# Patient Record
Sex: Female | Born: 1955 | Race: Black or African American | Hispanic: No | Smoking: Never smoker
Health system: Southern US, Community
[De-identification: ages and names within clinical notes are randomized; demographics above are authoritative.]

## PROBLEM LIST (undated history)

## (undated) DIAGNOSIS — E119 Type 2 diabetes mellitus without complications: Secondary | ICD-10-CM

## (undated) DIAGNOSIS — I10 Essential (primary) hypertension: Secondary | ICD-10-CM

## (undated) DIAGNOSIS — K219 Gastro-esophageal reflux disease without esophagitis: Secondary | ICD-10-CM

## (undated) DIAGNOSIS — E7800 Pure hypercholesterolemia, unspecified: Secondary | ICD-10-CM

## (undated) DIAGNOSIS — IMO0002 Reserved for concepts with insufficient information to code with codable children: Secondary | ICD-10-CM

## (undated) HISTORY — PX: KNEE SURGERY: SHX244

## (undated) HISTORY — DX: Gastro-esophageal reflux disease without esophagitis: K21.9

## (undated) HISTORY — PX: TUBAL LIGATION: SHX77

## (undated) HISTORY — DX: Pure hypercholesterolemia, unspecified: E78.00

## (undated) HISTORY — DX: Type 2 diabetes mellitus without complications: E11.9

## (undated) HISTORY — DX: Essential (primary) hypertension: I10

## (undated) HISTORY — PX: BREAST REDUCTION SURGERY: SHX8

---

## 1998-01-17 ENCOUNTER — Ambulatory Visit (HOSPITAL_COMMUNITY): Admission: RE | Admit: 1998-01-17 | Discharge: 1998-01-17 | Payer: Self-pay | Admitting: Obstetrics & Gynecology

## 1999-02-03 ENCOUNTER — Encounter: Payer: Self-pay | Admitting: Obstetrics & Gynecology

## 1999-02-03 ENCOUNTER — Ambulatory Visit (HOSPITAL_COMMUNITY): Admission: RE | Admit: 1999-02-03 | Discharge: 1999-02-03 | Payer: Self-pay | Admitting: Obstetrics & Gynecology

## 2000-02-14 ENCOUNTER — Encounter: Payer: Self-pay | Admitting: Obstetrics & Gynecology

## 2000-02-14 ENCOUNTER — Ambulatory Visit (HOSPITAL_COMMUNITY): Admission: RE | Admit: 2000-02-14 | Discharge: 2000-02-14 | Payer: Self-pay | Admitting: Obstetrics & Gynecology

## 2000-09-09 ENCOUNTER — Encounter (INDEPENDENT_AMBULATORY_CARE_PROVIDER_SITE_OTHER): Payer: Self-pay

## 2000-09-09 ENCOUNTER — Other Ambulatory Visit: Admission: RE | Admit: 2000-09-09 | Discharge: 2000-09-09 | Payer: Self-pay | Admitting: *Deleted

## 2000-11-01 ENCOUNTER — Other Ambulatory Visit: Admission: RE | Admit: 2000-11-01 | Discharge: 2000-11-01 | Payer: Self-pay | Admitting: Obstetrics & Gynecology

## 2001-11-06 ENCOUNTER — Other Ambulatory Visit: Admission: RE | Admit: 2001-11-06 | Discharge: 2001-11-06 | Payer: Self-pay | Admitting: Obstetrics & Gynecology

## 2001-11-07 ENCOUNTER — Encounter: Payer: Self-pay | Admitting: Obstetrics & Gynecology

## 2001-11-07 ENCOUNTER — Ambulatory Visit (HOSPITAL_COMMUNITY): Admission: RE | Admit: 2001-11-07 | Discharge: 2001-11-07 | Payer: Self-pay | Admitting: Obstetrics & Gynecology

## 2002-10-19 ENCOUNTER — Ambulatory Visit (HOSPITAL_COMMUNITY): Admission: RE | Admit: 2002-10-19 | Discharge: 2002-10-19 | Payer: Self-pay | Admitting: Family Medicine

## 2002-10-19 ENCOUNTER — Encounter: Payer: Self-pay | Admitting: Family Medicine

## 2003-01-06 ENCOUNTER — Other Ambulatory Visit: Admission: RE | Admit: 2003-01-06 | Discharge: 2003-01-06 | Payer: Self-pay | Admitting: Obstetrics & Gynecology

## 2003-08-17 ENCOUNTER — Encounter: Admission: RE | Admit: 2003-08-17 | Discharge: 2003-08-17 | Payer: Self-pay | Admitting: Neurosurgery

## 2003-09-03 ENCOUNTER — Encounter: Admission: RE | Admit: 2003-09-03 | Discharge: 2003-09-03 | Payer: Self-pay | Admitting: Neurosurgery

## 2004-05-06 ENCOUNTER — Ambulatory Visit (HOSPITAL_COMMUNITY): Admission: RE | Admit: 2004-05-06 | Discharge: 2004-05-06 | Payer: Self-pay | Admitting: Orthopedic Surgery

## 2005-01-09 ENCOUNTER — Encounter: Admission: RE | Admit: 2005-01-09 | Discharge: 2005-01-09 | Payer: Self-pay | Admitting: Neurosurgery

## 2005-06-13 ENCOUNTER — Other Ambulatory Visit: Admission: RE | Admit: 2005-06-13 | Discharge: 2005-06-13 | Payer: Self-pay | Admitting: Obstetrics & Gynecology

## 2005-06-20 ENCOUNTER — Ambulatory Visit (HOSPITAL_COMMUNITY): Admission: RE | Admit: 2005-06-20 | Discharge: 2005-06-20 | Payer: Self-pay | Admitting: Obstetrics & Gynecology

## 2005-07-17 ENCOUNTER — Emergency Department (HOSPITAL_COMMUNITY): Admission: EM | Admit: 2005-07-17 | Discharge: 2005-07-17 | Payer: Self-pay | Admitting: Emergency Medicine

## 2007-02-06 ENCOUNTER — Encounter: Admission: RE | Admit: 2007-02-06 | Discharge: 2007-02-06 | Payer: Self-pay | Admitting: Neurology

## 2007-02-26 ENCOUNTER — Encounter: Admission: RE | Admit: 2007-02-26 | Discharge: 2007-02-26 | Payer: Self-pay | Admitting: Neurology

## 2011-11-14 ENCOUNTER — Encounter: Payer: Self-pay | Admitting: Internal Medicine

## 2011-12-18 ENCOUNTER — Other Ambulatory Visit: Payer: Self-pay | Admitting: Internal Medicine

## 2012-04-24 ENCOUNTER — Emergency Department (HOSPITAL_COMMUNITY)
Admission: EM | Admit: 2012-04-24 | Discharge: 2012-04-24 | Disposition: A | Discharge status: Home / Self Care | Payer: Managed Care, Other (non HMO) | Attending: Emergency Medicine | Admitting: Emergency Medicine

## 2012-04-24 ENCOUNTER — Encounter (HOSPITAL_COMMUNITY): Payer: Self-pay | Admitting: *Deleted

## 2012-04-24 DIAGNOSIS — J069 Acute upper respiratory infection, unspecified: Secondary | ICD-10-CM

## 2012-04-24 DIAGNOSIS — J029 Acute pharyngitis, unspecified: Secondary | ICD-10-CM | POA: Insufficient documentation

## 2012-04-24 DIAGNOSIS — IMO0002 Reserved for concepts with insufficient information to code with codable children: Secondary | ICD-10-CM | POA: Insufficient documentation

## 2012-04-24 HISTORY — DX: Reserved for concepts with insufficient information to code with codable children: IMO0002

## 2012-04-24 LAB — RAPID STREP SCREEN (MED CTR MEBANE ONLY): Streptococcus, Group A Screen (Direct): NEGATIVE

## 2012-04-24 NOTE — ED Provider Notes (Addendum)
History     CSN: [id number]  Arrival date & time 04/24/12  1105   First MD Initiated Contact with Patient 04/24/12 1144      Chief Complaint  Patient presents with  . Numbness  . Sore Throat    (Consider location/radiation/quality/duration/timing/severity/associated sxs/prior treatment) HPI  A generally healthy 56 year old female is complaining of sore throat and numbness to the face. She reports for the past month she has had intermittent bouts of facial numbness. First recurrent was a month ago when she noticed sore throat and tenderness to the left tonsil. At that time she also experiencing a tingling sensation to the left side of her face. Patient also noticed white spots on the tonsils and was using a Q-tip to remove it. States symptoms lasted for a day and resolved. For the past 2-3 days she has noticed the same symptoms again. She experiencing sore throat with tenderness to her left tonsil. She also experiencing mild ear discomfort and a mild throbbing headache. Has notice tingling sensation to the left side of her face, also extending down to her left shoulder around left nipple. She mentioned that she has breast reduction a while back and has had intermittent numbness and  tingling sensation to the affected site, this sensation is similar.  she denies any chest pain, shortness of breath, nausea, diaphoresis or rash.  No hx of heart disease, nonsmoker.    Denies fever, chills, vision changes, neck pain, back pain, or weakness. Does not have a PCP but does have a scheduled appointment with Dr. Velna Hatchet to establish PCP care.      Past Medical History  Diagnosis Date  . DDD (degenerative disc disease)     Past Surgical History  Procedure Date  . Knee surgery   . Tubal ligation   . Cesarean section   . Breast reduction surgery     No family history on file.  History  Substance Use Topics  . Smoking status: Never Smoker   . Smokeless tobacco: Not on file  . Alcohol  Use: No    OB History    Grav Para Term Preterm Abortions TAB SAB Ect Mult Living                  Review of Systems  All other systems reviewed and are negative.    Allergies  Skelaxin  Home Medications   Current Outpatient Rx  Name Route Sig Dispense Refill  . CALTRATE 600 + SOY PO Oral Take 1 tablet by mouth daily.    Marland Kitchen VITAMIN D 1000 UNITS PO TABS Oral Take 2,000 Units by mouth daily.    . CYCLOBENZAPRINE HCL 5 MG PO TABS Oral Take 5 mg by mouth 3 (three) times daily as needed. Muscle spasms    . GLUCOSAMINE-CHONDROITIN 500-400 MG PO TABS Oral Take 2 tablets by mouth daily.    . IBUPROFEN 200 MG PO TABS Oral Take 800 mg by mouth every 6 (six) hours as needed. pain    . LACTASE 3000 UNITS PO TABS Oral Take 1 tablet by mouth 3 (three) times daily as needed. Dairy consumption    . LIDOCAINE 5 % EX PTCH Transdermal Place 1 patch onto the skin daily as needed. Pain.Marland KitchenMarland KitchenMarland KitchenRemove & Discard patch within 12 hours or as directed by MD    . TRAMADOL HCL 50 MG PO TABS Oral Take 50 mg by mouth every 6 (six) hours as needed. pain      There were no vitals taken  for this visit.  Physical Exam  Nursing note and vitals reviewed. Constitutional: She is oriented to person, place, and time. She appears well-developed and well-nourished. No distress.  HENT:  Head: Normocephalic and atraumatic.  Right Ear: External ear normal.  Left Ear: External ear normal.  Nose: Nose normal.  Mouth/Throat: Uvula is midline and oropharynx is clear and moist.       Uvula Midline. Mild tonsillar enlargement bilaterally with minimal exudates. No evidence of peritonsillar abscess, or Ludwig's angina  Eyes: Conjunctivae and EOM are normal. Pupils are equal, round, and reactive to light.  Neck: Normal range of motion. Neck supple.  Lymphadenopathy:    She has no cervical adenopathy.  Neurological: She is alert and oriented to person, place, and time. She has normal strength. No cranial nerve deficit or sensory  deficit. GCS eye subscore is 4. GCS verbal subscore is 5. GCS motor subscore is 6.    ED Course  Procedures (including critical care time)  Labs Reviewed - No data to display No results found.   No diagnosis found.   Date: 05/22/2012  Rate: 71  Rhythm: normal sinus rhythm  QRS Axis: normal  Intervals: normal  ST/T Wave abnormalities: normal  Conduction Disutrbances: none  Narrative Interpretation:   Old EKG Reviewed: No significant changes noted     Results for orders placed during the hospital encounter of 04/24/12  RAPID STREP SCREEN      Component Value Range   Streptococcus, Group A Screen (Direct) NEGATIVE  NEGATIVE   1. Viral pharyngitis   MDM  Vague facial numbness with no obvious numbness on exam. CN II-XII grossly intact.  No facial asymmetry.  5/5 strength to upper extremity.  No evidence of stroke.  Low suspicion for cardiopulmonary etiology.  On exam pt does have tonsilar enlargement with mild exudates.  Strep test ordered.    12:45 PM Strep test neg.  I discussed with my attending, who has evaluated pt and agrees pt is safe to be discharged.  Will treat for URI with symptomatic treatment.  Pt agrees with plan.        Fayrene Helper, PA-C 04/24/12 1250  Fayrene Helper, PA-C 05/22/12 1746

## 2012-04-24 NOTE — ED Notes (Signed)
Pt reports numbness to L side of face for a few days. Sore throat, ear pain, slight HA, mild swelling to L jawline. Sts numbness extends to L shoulder and around L nipple. Neg stroke screen. Pt A&Ox4.

## 2012-04-24 NOTE — ED Provider Notes (Signed)
56 y.o. Female with sore throat and some numbness in back of throat.  PE Some tonsillar exudate.  History/physical exam/procedure(s) were performed by non-physician practitioner and as supervising physician I was immediately available for consultation/collaboration. I have reviewed all notes and am in agreement with care and plan.   Hilario Quarry, MD 04/24/12 [phone]

## 2012-04-24 NOTE — ED Notes (Signed)
PA at bedside Pt alert and oriented x4. Respirations even and unlabored, bilateral symmetrical rise and fall of chest. Skin warm and dry. In no acute distress. Denies needs.   

## 2012-05-23 NOTE — ED Provider Notes (Signed)
56 y.o. female with complaints of uri symptoms and facial paresthesia with normal exam.  History/physical exam/procedure(s) were performed by non-physician practitioner and as supervising physician I was immediately available for consultation/collaboration. I have reviewed all notes and am in agreement with care and plan.   Hilario Quarry, MD 05/23/12 [phone]

## 2014-09-06 ENCOUNTER — Other Ambulatory Visit: Payer: Self-pay | Admitting: Obstetrics & Gynecology

## 2014-09-06 DIAGNOSIS — R928 Other abnormal and inconclusive findings on diagnostic imaging of breast: Secondary | ICD-10-CM

## 2014-09-22 ENCOUNTER — Ambulatory Visit
Admission: RE | Admit: 2014-09-22 | Discharge: 2014-09-22 | Disposition: A | Discharge status: Home / Self Care | Payer: Managed Care, Other (non HMO) | Source: Ambulatory Visit | Attending: Obstetrics & Gynecology | Admitting: Obstetrics & Gynecology

## 2014-09-22 DIAGNOSIS — R928 Other abnormal and inconclusive findings on diagnostic imaging of breast: Secondary | ICD-10-CM

## 2016-04-25 LAB — HM COLONOSCOPY

## 2016-10-11 IMAGING — MG MM DIANOSTIC UNILATERAL R
2 series · 2 of 2 positions shown · non-contrast
Comparison: Previous examinations.

CLINICAL DATA: Recall from screening mammogram.

EXAM:
DIGITAL DIAGNOSTIC  right breast MAMMOGRAM WITH CAD

[R MLO]
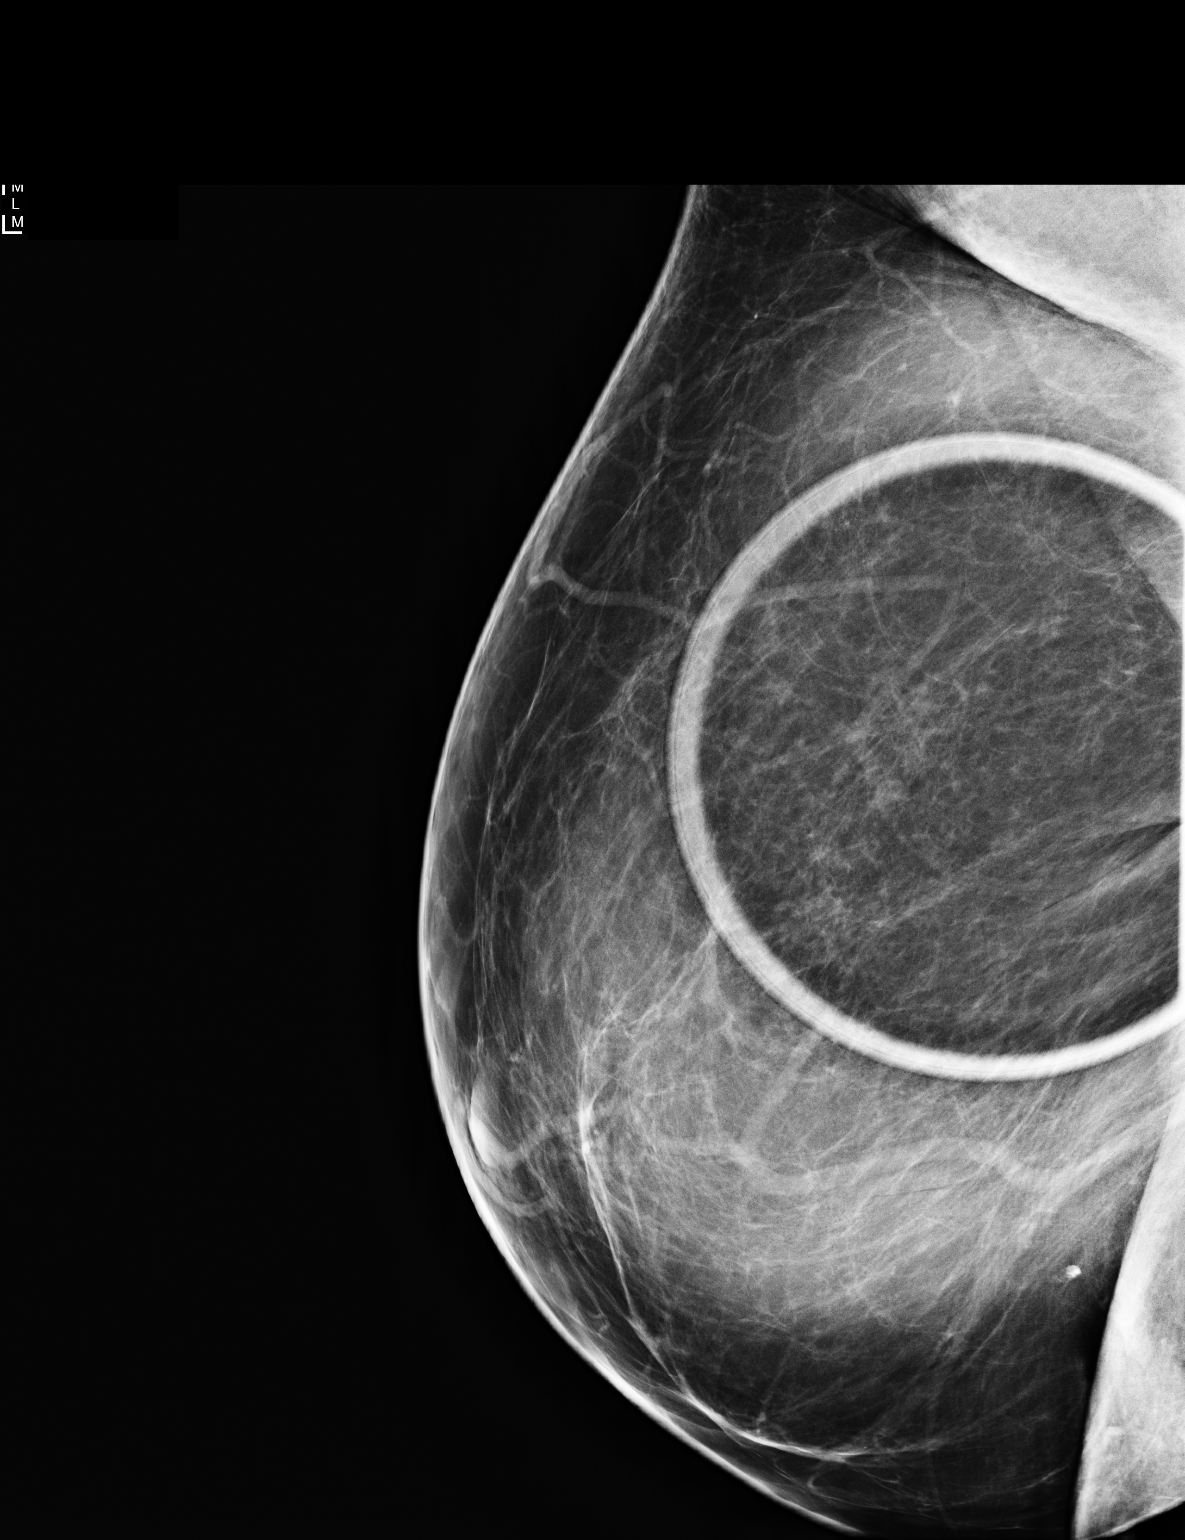

[R CC]
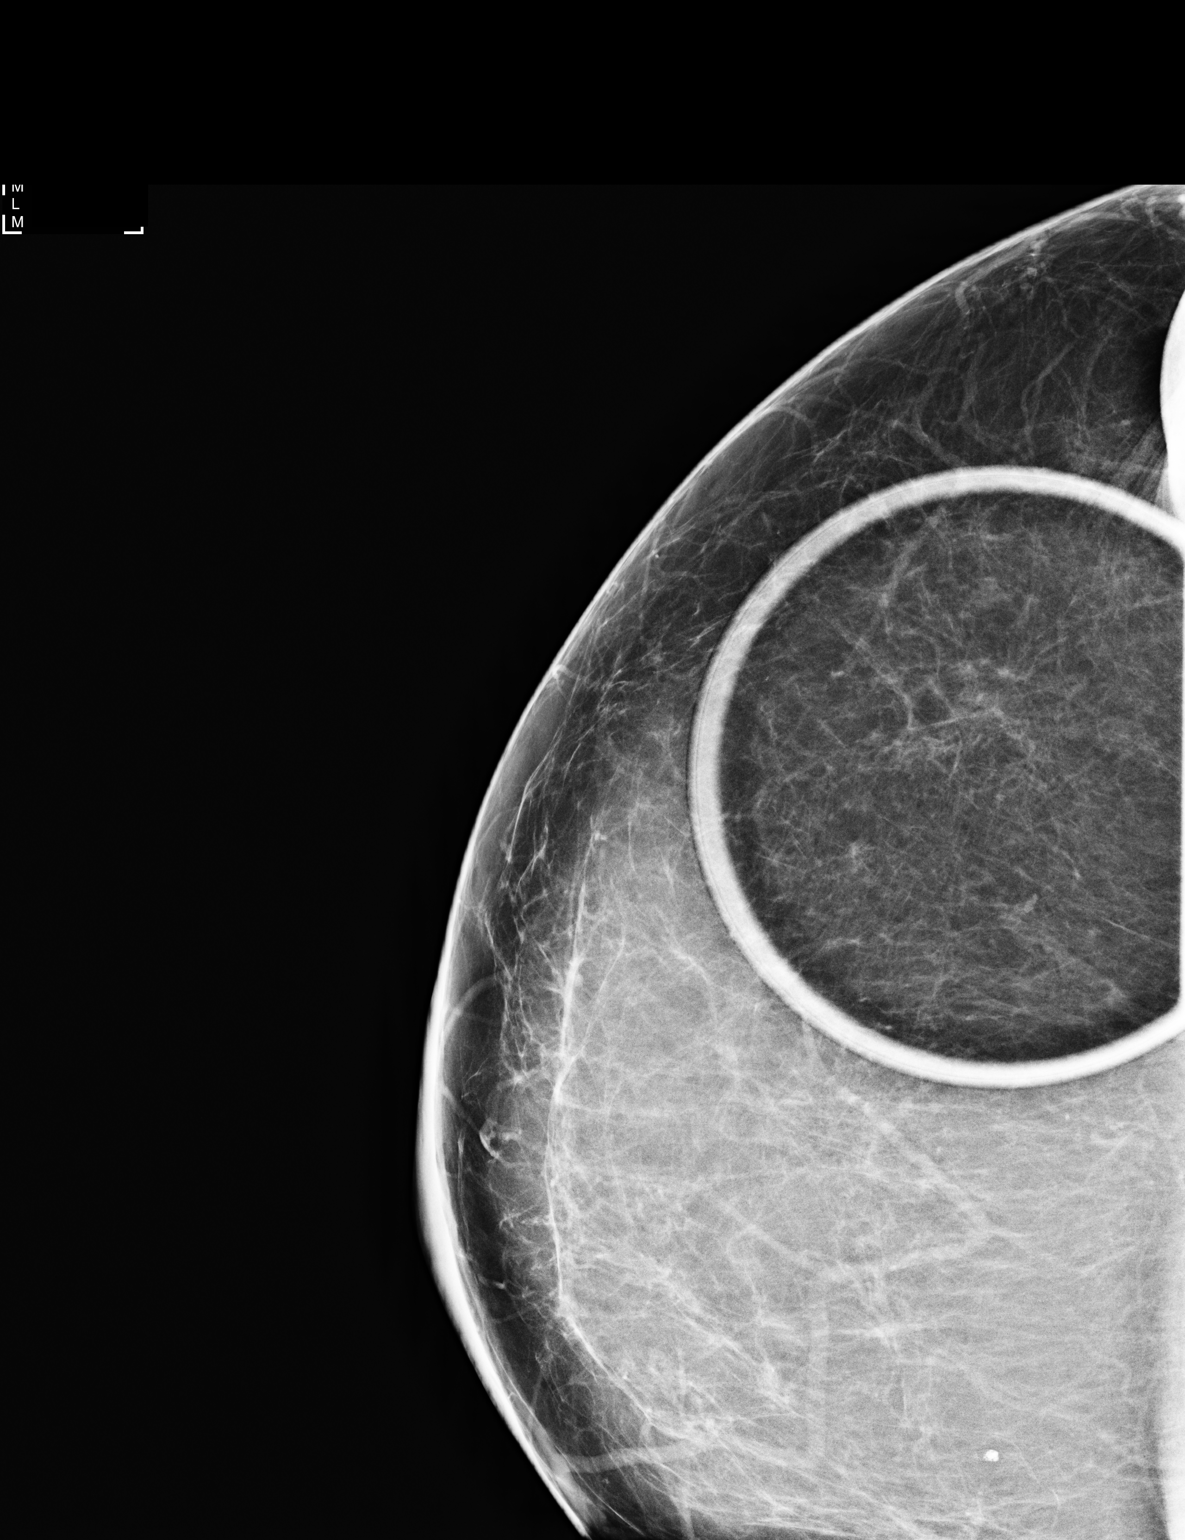

[2 of 2 positions shown; findings below may reference images not displayed]

ACR Breast Density Category b: There are scattered areas of
fibroglandular density.
FINDINGS: Additional views of the right breast demonstrate a small benign
appearing intramammary lymph node located within the upper outer
quadrant of the right breast which appears stable compared with
prior studies.

Mammographic images were processed with CAD.
IMPRESSION: No findings worrisome for malignancy. Recommend screening
mammography in 1 year.

RECOMMENDATION:
Bilateral screening mammography in 1 year.

I have discussed the findings and recommendations with the patient.
Results were also provided in writing at the conclusion of the
visit. If applicable, a reminder letter will be sent to the patient
regarding the next appointment.

BI-RADS CATEGORY  1: Negative.

## 2018-08-05 ENCOUNTER — Other Ambulatory Visit: Payer: Self-pay | Admitting: Internal Medicine

## 2018-08-06 NOTE — Telephone Encounter (Signed)
Tramadol refill

## 2018-08-07 ENCOUNTER — Ambulatory Visit: Payer: Self-pay | Admitting: Internal Medicine

## 2018-08-22 ENCOUNTER — Ambulatory Visit: Payer: Self-pay | Admitting: Nurse Practitioner

## 2018-09-15 ENCOUNTER — Encounter (HOSPITAL_COMMUNITY): Payer: Self-pay

## 2018-09-15 ENCOUNTER — Ambulatory Visit (HOSPITAL_COMMUNITY)
Admission: EM | Admit: 2018-09-15 | Discharge: 2018-09-15 | Disposition: A | Discharge status: Home / Self Care | Payer: BC Managed Care – PPO | Attending: Internal Medicine | Admitting: Internal Medicine

## 2018-09-15 DIAGNOSIS — B9789 Other viral agents as the cause of diseases classified elsewhere: Secondary | ICD-10-CM

## 2018-09-15 DIAGNOSIS — R05 Cough: Secondary | ICD-10-CM | POA: Diagnosis present

## 2018-09-15 DIAGNOSIS — J069 Acute upper respiratory infection, unspecified: Secondary | ICD-10-CM

## 2018-09-15 DIAGNOSIS — J028 Acute pharyngitis due to other specified organisms: Secondary | ICD-10-CM | POA: Diagnosis not present

## 2018-09-15 DIAGNOSIS — Z79899 Other long term (current) drug therapy: Secondary | ICD-10-CM | POA: Diagnosis not present

## 2018-09-15 DIAGNOSIS — J029 Acute pharyngitis, unspecified: Secondary | ICD-10-CM

## 2018-09-15 DIAGNOSIS — Z888 Allergy status to other drugs, medicaments and biological substances status: Secondary | ICD-10-CM | POA: Insufficient documentation

## 2018-09-15 LAB — POCT RAPID STREP A: Streptococcus, Group A Screen (Direct): NEGATIVE

## 2018-09-15 MED ORDER — PROMETHAZINE-DM 6.25-15 MG/5ML PO SYRP: 5.0000 mL | ORAL_SOLUTION | Freq: Every evening | ORAL | 0 refills | Status: DC | PRN

## 2018-09-15 MED ORDER — BENZONATATE 100 MG PO CAPS: 100.0000 mg | ORAL_CAPSULE | Freq: Three times a day (TID) | ORAL | 0 refills | Status: DC | PRN

## 2018-09-15 NOTE — Discharge Instructions (Signed)
Hydrate well with at least 2 liters of water daily. You may pick up over-the-counter pseudoephedrine (Sudafed) and use this for post-nasal drainage, sinus congestion at a dose of 60mg  every 8 hours as needed. For sore throat try using a honey-based tea. Use 3 teaspoons of honey with juice squeezed from half lemon. Place shaved pieces of ginger into 1/2-1 cup of water and warm over stove top. Then mix the ingredients and repeat every 4 hours as needed.

## 2018-09-15 NOTE — ED Provider Notes (Signed)
MRN: [id number] DOB: 12/26/1955  Subjective:   Jasmine Rodriguez is a 62 y.o. female presenting for 4 day history of moderate-severe productive cough worse in the morning that elicits occasional wheezing, throat pain, intermittent headaches, hoarseness, sinus pressure/congestion. Has tried APAP, Tussin DM. Denies smoking cigarettes. Has an albuterol inhaler, states that she thinks she has adult onset asthma. Has used her inhaler 3-4 times in the past week.    No current facility-administered medications for this encounter.   Current Outpatient Medications:  .  Calcium Carb-Vit D-Soy Isoflav (CALTRATE 600 + SOY PO), Take 1 tablet by mouth daily., Disp: , Rfl:  .  cholecalciferol (VITAMIN D) 1000 UNITS tablet, Take 2,000 Units by mouth daily., Disp: , Rfl:  .  cyclobenzaprine (FLEXERIL) 5 MG tablet, Take 5 mg by mouth 3 (three) times daily as needed. Muscle spasms, Disp: , Rfl:  .  glucosamine-chondroitin 500-400 MG tablet, Take 2 tablets by mouth daily., Disp: , Rfl:  .  ibuprofen (ADVIL,MOTRIN) 200 MG tablet, Take 800 mg by mouth every 6 (six) hours as needed. pain, Disp: , Rfl:  .  lactase (LACTAID) 3000 UNITS tablet, Take 1 tablet by mouth 3 (three) times daily as needed. Dairy consumption, Disp: , Rfl:  .  lidocaine (LIDODERM) 5 %, Place 1 patch onto the skin daily as needed. Pain.Marland Kitchen.Marland Kitchen.Marland Kitchen.Remove & Discard patch within 12 hours or as directed by MD, Disp: , Rfl:  .  traMADol (ULTRAM) 50 MG tablet, TAKE 1 TABLET BY MOUTH EVERY 8 HOURS AS NEEDED, Disp: 40 tablet, Rfl: 1    Allergies  Allergen Reactions  . Skelaxin [Metaxalone] Anaphylaxis, Itching and Rash    Past Medical History:  Diagnosis Date  . DDD (degenerative disc disease)      Past Surgical History:  Procedure Laterality Date  . BREAST REDUCTION SURGERY    . CESAREAN SECTION    . KNEE SURGERY    . TUBAL LIGATION      Objective:   Vitals: BP (!) 144/82 (BP Location: Left Arm)   Pulse 89   Temp 98.1 F (36.7 C) (Oral)    Resp 16   SpO2 100%    Physical Exam Constitutional:      General: She is not in acute distress.    Appearance: She is well-developed. She is not ill-appearing, toxic-appearing or diaphoretic.  HENT:     Right Ear: Tympanic membrane normal. No drainage, swelling or tenderness. No middle ear effusion. Tympanic membrane is not erythematous.     Left Ear: Tympanic membrane normal. No drainage, swelling or tenderness.  No middle ear effusion. Tympanic membrane is not erythematous.     Nose: Congestion and rhinorrhea present.     Mouth/Throat:     Mouth: Mucous membranes are moist. No oral lesions.     Pharynx: Oropharynx is clear. No pharyngeal swelling, oropharyngeal exudate, posterior oropharyngeal erythema or uvula swelling.     Tonsils: No tonsillar exudate or tonsillar abscesses.  Eyes:     Conjunctiva/sclera: Conjunctivae normal.     Pupils: Pupils are equal, round, and reactive to light.  Cardiovascular:     Rate and Rhythm: Normal rate and regular rhythm.     Heart sounds: No murmur. No friction rub. No gallop.   Pulmonary:     Effort: No respiratory distress.     Breath sounds: No stridor. No wheezing, rhonchi or rales.  Neurological:     Mental Status: She is alert and oriented to person, place, and time.    Results  for orders placed or performed during the hospital encounter of 09/15/18 (from the past 24 hour(s))  POCT rapid strep A [facility])     Status: None   Collection Time: 09/15/18  1:42 PM  Result Value Ref Range   Streptococcus, Group A Screen (Direct) NEGATIVE NEGATIVE   Assessment and Plan :   Viral URI with cough  Sore throat  Likely viral in etiology d/t reassuring physical exam findings. Advised supportive care, offered symptomatic relief.  Strep culture is pending.  Offered patient prescription for prednisone which she declined for now.  Return-to-clinic precautions discussed, patient verbalized understanding.      Wallis Bamberg, PA-C 09/15/18  1540

## 2018-09-15 NOTE — ED Triage Notes (Signed)
Pt present coughing with throat pain, symptoms started 4 days ago.

## 2018-09-18 LAB — CULTURE, GROUP A STREP (THRC)

## 2018-09-19 ENCOUNTER — Ambulatory Visit: Payer: Self-pay | Admitting: Nurse Practitioner

## 2018-10-16 ENCOUNTER — Ambulatory Visit: Payer: BC Managed Care – PPO | Admitting: Nurse Practitioner

## 2018-10-16 ENCOUNTER — Encounter: Payer: Self-pay | Admitting: Nurse Practitioner

## 2018-10-16 VITALS — BP 132/82 | HR 70 | Temp 98.1°F | Ht 67.0 in | Wt 224.2 lb

## 2018-10-16 DIAGNOSIS — M5442 Lumbago with sciatica, left side: Secondary | ICD-10-CM | POA: Diagnosis not present

## 2018-10-16 DIAGNOSIS — R7303 Prediabetes: Secondary | ICD-10-CM | POA: Insufficient documentation

## 2018-10-16 DIAGNOSIS — J069 Acute upper respiratory infection, unspecified: Secondary | ICD-10-CM | POA: Diagnosis not present

## 2018-10-16 DIAGNOSIS — G8929 Other chronic pain: Secondary | ICD-10-CM | POA: Diagnosis not present

## 2018-10-16 MED ORDER — TRAMADOL HCL 50 MG PO TABS: 50.0000 mg | ORAL_TABLET | Freq: Three times a day (TID) | ORAL | 1 refills | Status: DC | PRN

## 2018-10-16 MED ORDER — LIDOCAINE 5 % EX PTCH: 1.0000 | MEDICATED_PATCH | Freq: Every day | CUTANEOUS | 2 refills | Status: DC | PRN

## 2018-10-16 MED ORDER — AZITHROMYCIN 250 MG PO TABS: ORAL_TABLET | ORAL | 0 refills | Status: AC

## 2018-10-16 NOTE — Patient Instructions (Signed)

## 2018-10-16 NOTE — Progress Notes (Addendum)
Subjective:     Patient ID: Jasmine Rodriguez , female    DOB: 15-Sep-1956 , 63 y.o.   MRN: [id number]   Chief Complaint  Patient presents with  . Diabetes    HPI  [facility] - tessalon perles,   Diabetes  She presents for her follow-up diabetic visit. Diabetes type: prediabetes. Her disease course has been stable. Pertinent negatives for hypoglycemia include no confusion or dizziness. Associated symptoms include fatigue. Pertinent negatives for diabetes include no blurred vision, no chest pain, no polydipsia, no polyphagia and no polyuria. There are no hypoglycemic complications. Symptoms are improving. There are no diabetic complications. When asked about current treatments, none were reported. She is compliant with treatment all of the time. She has not had a previous visit with a dietitian. An ACE inhibitor/angiotensin II receptor blocker is not being taken. She does not see a podiatrist (Dr. Fawn Kirk).Eye exam is current.     Past Medical History:  Diagnosis Date  . DDD (degenerative disc disease)      No family history on file.   Current Outpatient Medications:  .  benzonatate (TESSALON) 100 MG capsule, Take 1-2 capsules (100-200 mg total) by mouth 3 (three) times daily as needed., Disp: 60 capsule, Rfl: 0 .  Calcium Carb-Vit D-Soy Isoflav (CALTRATE 600 + SOY PO), Take 1 tablet by mouth daily., Disp: , Rfl:  .  cholecalciferol (VITAMIN D) 1000 UNITS tablet, Take 2,000 Units by mouth daily., Disp: , Rfl:  .  glucosamine-chondroitin 500-400 MG tablet, Take 1 tablet by mouth daily. , Disp: , Rfl:  .  ibuprofen (ADVIL,MOTRIN) 200 MG tablet, Take 800 mg by mouth every 6 (six) hours as needed. pain, Disp: , Rfl:  .  lactase (LACTAID) 3000 UNITS tablet, Take 1 tablet by mouth 3 (three) times daily as needed. Dairy consumption, Disp: , Rfl:  .  lidocaine (LIDODERM) 5 %, Place 1 patch onto the skin daily as needed. Pain.Marland KitchenMarland KitchenMarland KitchenRemove & Discard patch within 12 hours or as directed by MD, Disp:  , Rfl:  .  promethazine-dextromethorphan (PROMETHAZINE-DM) 6.25-15 MG/5ML syrup, Take 5 mLs by mouth at bedtime as needed for cough., Disp: 100 mL, Rfl: 0 .  traMADol (ULTRAM) 50 MG tablet, TAKE 1 TABLET BY MOUTH EVERY 8 HOURS AS NEEDED, Disp: 40 tablet, Rfl: 1   Allergies  Allergen Reactions  . Skelaxin [Metaxalone] Anaphylaxis, Itching and Rash     Review of Systems  Constitutional: Positive for fatigue.  Eyes: Negative for blurred vision.  Cardiovascular: Negative for chest pain.  Endocrine: Negative for polydipsia, polyphagia and polyuria.  Musculoskeletal: Positive for back pain (low back pain). Negative for arthralgias and joint swelling.  Neurological: Negative for dizziness.  Psychiatric/Behavioral: Negative for confusion.     Today's Vitals   10/16/18 1151  BP: 132/82  Pulse: 70  Temp: 98.1 F (36.7 C)  TempSrc: Oral  SpO2: 97%  Weight: 224 lb 3.2 oz (101.7 kg)  Height: 5\' 7"  (1.702 m)  PainSc: 2   PainLoc: Knee   Body mass index is 35.11 kg/m.   Objective:  Physical Exam Vitals signs reviewed.  Constitutional:      Appearance: She is well-developed.  Neck:     Musculoskeletal: Normal range of motion and neck supple.  Cardiovascular:     Rate and Rhythm: Normal rate and regular rhythm.     Heart sounds: Normal heart sounds. No murmur.  Pulmonary:     Effort: Pulmonary effort is normal.     Breath sounds:  Normal breath sounds.  Chest:     Chest wall: No tenderness.  Musculoskeletal: Normal range of motion.  Skin:    General: Skin is warm and dry.     Capillary Refill: Capillary refill takes less than 2 seconds.  Neurological:     Mental Status: She is alert and oriented to person, place, and time.         Assessment And Plan:     1. Upper respiratory tract infection, unspecified type  Ongoing symptoms and fatigue since December, seen at Urgent Care in Dec treated with tessalon perles and cough syrup has been ineffective.   Using her albuterol  inhaler 3-4 times per week  Will treat with antibiotic at this time. - azithromycin (ZITHROMAX) 250 MG tablet; Take 2 tablets (500 mg) on  Day 1,  followed by 1 tablet (250 mg) once daily on Days 2 through 5.  Dispense: 6 each; Refill: 0  2. Prediabetes  Chronic, controlled  No current medications  Encouraged to limit intake of sugary foods and drinks  Encouraged to increase physical activity to 150 minutes per week - Hemoglobin A1c - Lipid Profile  3. Chronic left-sided low back pain with left-sided sciatica  She is being seen by Dr. Venetia MaxonStern for her pain   Intermittent radiculopathy could be dual component with tingling skin from long term history with diabetes now prediabetes vs impinged nerve. - lidocaine (LIDODERM) 5 %; Place 1 patch onto the skin daily as needed. Pain.Marland Kitchen.Marland Kitchen.Marland Kitchen.Remove & Discard patch within 12 hours or as directed by MD  Dispense: 30 patch; Refill: 2 - traMADol (ULTRAM) 50 MG tablet; Take 1 tablet (50 mg total) by mouth every 8 (eight) hours as needed.  Dispense: 40 tablet; Refill: 1    Arnette FeltsJanece Fred Franzen, FNP

## 2018-10-17 LAB — LIPID PANEL
Chol/HDL Ratio: 2.7 ratio (ref 0.0–4.4)
Cholesterol, Total: 185 mg/dL (ref 100–199)
HDL: 68 mg/dL (ref >39)
LDL Calculated: 106 mg/dL — ABNORMAL HIGH (ref 0–99)
Triglycerides: 56 mg/dL (ref 0–149)
VLDL Cholesterol Cal: 11 mg/dL (ref 5–40)

## 2018-10-17 LAB — HEMOGLOBIN A1C
Est. average glucose Bld gHb Est-mCnc: 134 mg/dL
Hgb A1c MFr Bld: 6.3 % — ABNORMAL HIGH (ref 4.8–5.6)

## 2018-11-03 ENCOUNTER — Encounter: Payer: Self-pay | Admitting: Internal Medicine

## 2018-11-03 ENCOUNTER — Ambulatory Visit: Payer: BC Managed Care – PPO | Admitting: Internal Medicine

## 2018-11-03 VITALS — BP 140/86 | HR 71 | Temp 97.8°F | Ht 67.0 in | Wt 219.6 lb

## 2018-11-03 DIAGNOSIS — G8929 Other chronic pain: Secondary | ICD-10-CM

## 2018-11-03 DIAGNOSIS — M25511 Pain in right shoulder: Secondary | ICD-10-CM | POA: Diagnosis not present

## 2018-11-03 DIAGNOSIS — R5383 Other fatigue: Secondary | ICD-10-CM

## 2018-11-03 DIAGNOSIS — R202 Paresthesia of skin: Secondary | ICD-10-CM

## 2018-11-03 DIAGNOSIS — Z6834 Body mass index (BMI) 34.0-34.9, adult: Secondary | ICD-10-CM

## 2018-11-03 DIAGNOSIS — E6609 Other obesity due to excess calories: Secondary | ICD-10-CM | POA: Diagnosis not present

## 2018-11-03 MED ORDER — SITAGLIPTIN PHOS-METFORMIN HCL 50-500 MG PO TABS: 1.0000 | ORAL_TABLET | Freq: Every day | ORAL | 1 refills | Status: DC

## 2018-11-03 MED ORDER — CYCLOBENZAPRINE HCL 10 MG PO TABS: 10.0000 mg | ORAL_TABLET | Freq: Every evening | ORAL | 0 refills | Status: DC | PRN

## 2018-11-03 NOTE — Patient Instructions (Signed)
Paresthesia Paresthesia is a burning or prickling feeling. This feeling can happen in any part of the body. It often happens in the hands, arms, legs, or feet. Usually, it is not painful. In most cases, the feeling goes away in a short time and is not a sign of a serious problem. If you have paresthesia that lasts a long time, you may need to be seen by your doctor. Follow these instructions at home: Alcohol use   Do not drink alcohol if: ? Your doctor tells you not to drink. ? You are pregnant, may be pregnant, or are planning to become pregnant.  If you drink alcohol, limit how much you have: ? 0-1 drink a day for women. ? 0-2 drinks a day for men.  Be aware of how much alcohol is in your drink. In the [place] one drink equals one typical bottle of beer (12 oz), one-half glass of wine (5 oz), or one shot of hard liquor (1 oz). Nutrition  Eat a healthy diet. This includes: ? Eating foods that have a lot of fiber in them, such as fresh fruits and vegetables, whole grains, and beans. ? Limiting foods that have a lot of fat and processed sugars in them, such as fried or sweet foods. General instructions  Take over-the-counter and prescription medicines only as told by your doctor.  Do not use any products that have nicotine or tobacco in them, such as cigarettes and e-cigarettes. If you need help quitting, ask your doctor.  If you have diabetes, work with your doctor to make sure your blood sugar stays in a healthy range.  If your feet feel numb: ? Check for redness, warmth, and swelling every day. ? Wear padded socks and comfortable shoes. These help protect your feet.  Keep all follow-up visits as told by your doctor. This is important. Contact a doctor if:  You have paresthesia that gets worse or does not go away.  Your burning or prickling feeling gets worse when you walk.  You have pain or cramps.  You feel dizzy.  You have a rash. Get help right away if you:  Feel  weak.  Have trouble walking or moving.  Have problems speaking, understanding, or seeing.  Feel confused.  Cannot control when you pee (urinate) or poop (have a bowel movement).  Lose feeling (have numbness) after an injury.  Have new weakness in an arm or leg.  Pass out (faint). Summary  Paresthesia is a burning or prickling feeling. It often happens in the hands, arms, legs, or feet.  In most cases, the feeling goes away in a short time and is not a sign of a serious problem.  If you have paresthesia that lasts a long time, you may need to be seen by your doctor. This information is not intended to replace advice given to you by your health care provider. Make sure you discuss any questions you have with your health care provider. Document Released: 08/30/2008 Document Revised: 09/26/2017 Document Reviewed: 09/26/2017 [employer]vier Interactive Patient Education  2019 [employer]  

## 2018-11-03 NOTE — Progress Notes (Signed)
Subjective:     Patient ID: Jasmine Rodriguez , female    DOB: [birth year] , 63 y.o.   MRN: [id number]   Chief Complaint  Patient presents with  . Arm Pain    right    HPI  She is here today for further evaluation of right arm pain/paresthesias. She states that she has had issues since she received her flu vaccine in October. She reports feeling a "lightning shock" at the time of her flu vaccine. She reports not paying much attention to the pain, b/c she also had concomitant upper respiratory issues. She again noticed discomfort in Nov 2019. She now has tingling/numbness down her r arm. She denies neck pain. She denies trauma/fall.  There is pain with movement of her right arm.   Arm Pain   Associated symptoms include numbness.     Past Medical History:  Diagnosis Date  . DDD (degenerative disc disease)      Family History  Problem Relation Age of Onset  . Hypertension Mother   . Arthritis Mother   . Hypertension Father   . Head & neck cancer Father      Current Outpatient Medications:  .  benzonatate (TESSALON) 100 MG capsule, Take 1-2 capsules (100-200 mg total) by mouth 3 (three) times daily as needed., Disp: 60 capsule, Rfl: 0 .  Calcium Carb-Vit D-Soy Isoflav (CALTRATE 600 + SOY PO), Take 1 tablet by mouth daily., Disp: , Rfl:  .  cholecalciferol (VITAMIN D) 1000 UNITS tablet, Take 2,000 Units by mouth daily., Disp: , Rfl:  .  glucosamine-chondroitin 500-400 MG tablet, Take 1 tablet by mouth daily. , Disp: , Rfl:  .  ibuprofen (ADVIL,MOTRIN) 200 MG tablet, Take 800 mg by mouth every 6 (six) hours as needed. pain, Disp: , Rfl:  .  lactase (LACTAID) 3000 UNITS tablet, Take 1 tablet by mouth 3 (three) times daily as needed. Dairy consumption, Disp: , Rfl:  .  lidocaine (LIDODERM) 5 %, Place 1 patch onto the skin daily as needed. Pain.Marland KitchenMarland KitchenMarland KitchenRemove & Discard patch within 12 hours or as directed by MD, Disp: 30 patch, Rfl: 2 .  promethazine-dextromethorphan (PROMETHAZINE-DM)  6.25-15 MG/5ML syrup, Take 5 mLs by mouth at bedtime as needed for cough., Disp: 100 mL, Rfl: 0 .  traMADol (ULTRAM) 50 MG tablet, Take 1 tablet (50 mg total) by mouth every 8 (eight) hours as needed., Disp: 40 tablet, Rfl: 1 .  cyclobenzaprine (FLEXERIL) 10 MG tablet, Take 1 tablet (10 mg total) by mouth at bedtime as needed for muscle spasms., Disp: 30 tablet, Rfl: 0 .  sitaGLIPtin-metformin (JANUMET) 50-500 MG tablet, Take 1 tablet by mouth daily., Disp: 30 tablet, Rfl: 1   Allergies  Allergen Reactions  . Skelaxin [Metaxalone] Anaphylaxis, Itching and Rash     Review of Systems  Constitutional: Positive for fatigue (she reports feeling more tired than usual. denies change in her eating habits. thinks she may be sleeping less due to shoulder/back pain. ).  Respiratory: Negative.   Cardiovascular: Negative.   Gastrointestinal: Negative.   Skin: Negative.   Neurological: Positive for numbness.  Psychiatric/Behavioral: Negative.      Today's Vitals   11/03/18 1200  BP: 140/86  Pulse: 71  Temp: 97.8 F (36.6 C)  TempSrc: Oral  Weight: 219 lb 9.6 oz (99.6 kg)  Height: 5\' 7"  (1.702 m)  PainSc: 4   PainLoc: Arm   Body mass index is 34.39 kg/m.   Objective:  Physical Exam Vitals signs and nursing note reviewed.  Constitutional:      Appearance: Normal appearance. She is obese.  HENT:     Head: Normocephalic and atraumatic.  Cardiovascular:     Rate and Rhythm: Normal rate and regular rhythm.     Heart sounds: Normal heart sounds.  Pulmonary:     Effort: Pulmonary effort is normal.     Breath sounds: Normal breath sounds.  Musculoskeletal:     Right shoulder: She exhibits tenderness.     Cervical back: She exhibits tenderness and spasm.     Comments: Decreased ROM of cervical spine, lateral rotation to the right, due to pain  Skin:    General: Skin is warm.  Neurological:     General: No focal deficit present.     Mental Status: She is alert.  Psychiatric:         Mood and Affect: Mood normal.        Behavior: Behavior normal.         Assessment And Plan:     1. Paresthesia  Patient advised that her symptoms could be multifactorial.  There is a chance that she has cervical spine disease, versus an impingement syndrome of another cause. I will also check vit b12 levels. Thyroid test performed in August 2019 was normal. She was given rx flexeril to use 10mg  nightly as needed  - Nerve conduction test; Future - Vitamin B12  2. Right shoulder pain, chronic   Pt may have rotator cuff issue, there is decreased ROM and pain with full abduction. She is encouraged to apply topical pain cream to affected area twice daily as needed.   3. Class 1 obesity due to excess calories with serious comorbidity and body mass index (BMI) of 34.0 to 34.9 in adult  She has lost 5 pounds snce her last visit.  She is encouraged to strive for BMI less than 30 to decrease cardiac risk. She is encouraged to incorporate more activity into her daily routine.   4. Fatigue, unspecified type  Her most recent CBC was reviewed during her visit.  Pt advised that metformin (In her janumet) can contribute to impaired vit b12 absorption. I will check a vit b12 level today. She is encouraged to stay well hydrated. If normal, I will consider sleep study evaluation.     [name], MD

## 2018-11-04 LAB — VITAMIN B12: Vitamin B-12: 748 pg/mL (ref 232–1245)

## 2018-11-06 ENCOUNTER — Other Ambulatory Visit: Payer: Self-pay | Admitting: Internal Medicine

## 2018-11-28 ENCOUNTER — Other Ambulatory Visit: Payer: Self-pay | Admitting: Internal Medicine

## 2018-11-28 DIAGNOSIS — R202 Paresthesia of skin: Secondary | ICD-10-CM

## 2018-12-16 ENCOUNTER — Other Ambulatory Visit: Payer: Self-pay

## 2018-12-16 MED ORDER — ALBUTEROL SULFATE HFA 108 (90 BASE) MCG/ACT IN AERS: 2.0000 | INHALATION_SPRAY | Freq: Four times a day (QID) | RESPIRATORY_TRACT | 1 refills | Status: DC | PRN

## 2018-12-24 ENCOUNTER — Telehealth: Payer: Self-pay

## 2018-12-24 MED ORDER — ALBUTEROL SULFATE HFA 108 (90 BASE) MCG/ACT IN AERS: 2.0000 | INHALATION_SPRAY | Freq: Four times a day (QID) | RESPIRATORY_TRACT | 1 refills | Status: DC | PRN

## 2018-12-24 NOTE — Telephone Encounter (Signed)
The patient was asked to verify her pharmacy and she said to send to [facility].  The patient was told that her albuterol inhaler has been refaxed.

## 2018-12-25 ENCOUNTER — Telehealth: Payer: Self-pay | Admitting: Neurology

## 2018-12-25 NOTE — Telephone Encounter (Signed)
Is this patient's NCV/EMG considered urgent? Or should it be rescheduled for a later date?

## 2018-12-29 ENCOUNTER — Telehealth: Payer: Self-pay | Admitting: *Deleted

## 2018-12-29 NOTE — Telephone Encounter (Signed)
I spoke to pt relayed that we are severely reducing in person visits on pt.  Per Dr. Epimenio Foot if she feels like she is progressively getting worse then will see.   She stated she did not have fever, signs/ sx of covid 19, no travel exposure to hot spots.  She did want to keep this appt as she felt she was no better, getting worse.  She will be in as originally scheduled.  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

## 2018-12-30 ENCOUNTER — Other Ambulatory Visit: Payer: Self-pay

## 2018-12-30 ENCOUNTER — Encounter: Payer: BC Managed Care – PPO | Admitting: Neurology

## 2018-12-30 ENCOUNTER — Ambulatory Visit (INDEPENDENT_AMBULATORY_CARE_PROVIDER_SITE_OTHER): Payer: BC Managed Care – PPO | Admitting: Neurology

## 2018-12-30 DIAGNOSIS — M5412 Radiculopathy, cervical region: Secondary | ICD-10-CM | POA: Insufficient documentation

## 2018-12-30 DIAGNOSIS — G5603 Carpal tunnel syndrome, bilateral upper limbs: Secondary | ICD-10-CM | POA: Diagnosis not present

## 2018-12-30 DIAGNOSIS — Z0289 Encounter for other administrative examinations: Secondary | ICD-10-CM

## 2018-12-30 NOTE — Progress Notes (Signed)
Full Name: Jasmine Rodriguez Gender: Female MRN #: [id number] Date of Birth: 1956/05/18    Visit Date: 12/30/2018 09:03 Age: 63 Years 7 Months Old Examining Physician: Despina Arias, MD  Referring Physician: Dorothyann Peng, MD    History: Ms. Sieminski is a 63 year old woman who reports pain in the right shoulder and some tingling in the hands, right greater than left that began October 2019 when she received a flu vaccine.  She reports feeling a shock sensation when she got the injection.   Strength and sensation was normal in both arms.  Nerve conduction studies: Bilateral median and ulnar motor responses had normal distal latencies, amplitudes and conduction velocities.  The F-wave responses were normal.  Bilateral median sensory responses had delayed peak latencies, right worse than left, and the right response was mildly reduced in amplitude.  Ulnar sensory responses had slightly reduced distal latencies with normal amplitudes.  Radial sensory responses were normal.  Needle EMG: Needle electromyography of muscles of the right arm and shoulder was performed.  There was mild chronic denervation in the extensor digitorum communis muscle and a couple C6 and/4 C7 innervated muscles showed some polyphasic motor units but normal recruitment.  There was no abnormal spontaneous activity in any of the muscles tested.  Impression: This NCV/EMG study shows the following: 1.    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  There were no active features.   A. Epimenio Foot, MD, PhD, FAAN Certified in Neurology, Clinical Neurophysiology, Sleep Medicine, Pain Medicine and Neuroimaging Director, [facility]  [facility] [address] [place], [place] [address] [phone]     [facility]    Nerve / Sites Muscle Latency Ref. Amplitude  Ref. Rel Amp Segments Distance Velocity Ref. Area    ms ms mV mV %  cm m/s m/s mVms  L Median - APB     Wrist APB 3.6 ?4.4 12.9 ?4.0 100 Wrist - APB 7   41.7     Upper arm APB 8.0  12.9  99.9 Upper arm - Wrist 23 53 ?49 42.1  R Median - APB     Wrist APB 3.8 ?4.4 11.5 ?4.0 100 Wrist - APB 7   40.6     Upper arm APB 8.3  11.3  98.5 Upper arm - Wrist 22 49 ?49 41.1  L Ulnar - ADM     Wrist ADM 3.3 ?3.3 12.2 ?6.0 100 Wrist - ADM 7   45.9     B.Elbow ADM 7.0  11.9  97.7 B.Elbow - Wrist 22 59 ?49 44.5     A.Elbow ADM 8.7  11.5  96.7 A.Elbow - B.Elbow 10 58 ?49 44.2         A.Elbow - Wrist      R Ulnar - ADM     Wrist ADM 3.3 ?3.3 13.1 ?6.0 100 Wrist - ADM 7   41.4     B.Elbow ADM 6.9  11.8  90.3 B.Elbow - Wrist 20 56 ?49 40.0     A.Elbow ADM 8.7  11.6  97.9 A.Elbow - B.Elbow 10 55 ?49 38.7         A.Elbow - Wrist                 SNC    Nerve / Sites Rec. Site Peak Lat Ref.  Amp Ref.  Segments Distance    ms ms V V  cm  L Radial - Anatomical snuff box (Forearm)     Forearm Wrist 2.9 ?2.9 18 ?15 Forearm - Wrist 10  R Radial - Anatomical snuff box (Forearm)     Forearm Wrist 2.8 ?2.9 16 ?15 Forearm - Wrist 10  L Median - Orthodromic (Dig II, Mid palm)     Dig II Wrist 3.9 ?3.4 10 ?10 Dig II - Wrist 13  R Median - Orthodromic (Dig II, Mid palm)     Dig II Wrist 4.1 ?3.4 9 ?10 Dig II - Wrist 13  L Ulnar - Orthodromic, (Dig V, Mid palm)     Dig V Wrist 3.3 ?3.1 6 ?5 Dig V - Wrist 11  R Ulnar - Orthodromic, (Dig V, Mid palm)     Dig V Wrist 3.2 ?3.1 5 ?5 Dig V - Wrist 21                 F  Wave    Nerve F Lat Ref.   ms ms  L Ulnar - ADM 31.5 ?32.0  R Ulnar - ADM 30.7 ?32.0         EMG full       EMG Summary Table    Spontaneous MUAP Recruitment  Muscle IA Fib PSW Fasc Other Amp Dur. Poly Pattern  R. Deltoid Normal None None None _______ Normal Normal 1+ Normal  R. Triceps brachii Normal None None None _______ Normal Normal 1+ Normal  R. Biceps brachii Normal None None None  _______ Normal Normal Normal Normal  R. Extensor digitorum communis Normal None None None _______ Normal Increased 1+ Reduced  R. Flexor carpi ulnaris Normal None None None _______ Normal Normal Normal Normal  R. First dorsal interosseous Normal None None None _______ Normal Normal Normal Normal  R. Abductor pollicis brevis Normal None None None _______ Normal Normal Normal Normal  R. Supraspinatus Normal None None None _______ Normal Normal Normal Normal  R. Pronator teres Normal None None None _______ Normal Normal Normal Normal

## 2019-01-03 NOTE — Progress Notes (Signed)
Please check with pt- did she get nerve conduction study results. Positive for mild carpal tunnel and cervical spine disease. I would normally suggest physical therapy for this - however, given current situation - I suggest she perform stretching exercises at home. She may google exercises for neck pain.

## 2019-01-05 ENCOUNTER — Telehealth: Payer: Self-pay

## 2019-01-05 NOTE — Telephone Encounter (Signed)
I returned the pt's call and notified her that her nerve conduction study results have been faxed to Dr Maeola Harman at her request.

## 2019-01-05 NOTE — Progress Notes (Signed)
The pt hadn't gotten her results yet and she said thank you.

## 2019-01-16 ENCOUNTER — Other Ambulatory Visit: Payer: Self-pay | Admitting: Internal Medicine

## 2019-03-12 ENCOUNTER — Encounter: Payer: Self-pay | Admitting: Internal Medicine

## 2019-04-07 ENCOUNTER — Other Ambulatory Visit: Payer: Self-pay

## 2019-04-07 MED ORDER — VALACYCLOVIR HCL 500 MG PO TABS: 500.0000 mg | ORAL_TABLET | Freq: Every day | ORAL | 1 refills | Status: DC

## 2019-05-12 ENCOUNTER — Telehealth: Payer: Self-pay

## 2019-05-12 NOTE — Telephone Encounter (Signed)
The pt is requesting a refill of Tramadol.  The pt was informed that Dr. Baird Cancer is out of the office and that I would call her back if the NP can refill her prescription.

## 2019-05-13 ENCOUNTER — Telehealth: Payer: Self-pay

## 2019-05-13 NOTE — Telephone Encounter (Signed)
The pt was told that Laurance Flatten, NP said that the pt needed an appt for evaluation for the refill of tramadol because it hadn't been filed in a few months and the pt hasn't been in since January for a f/u.

## 2019-06-11 ENCOUNTER — Other Ambulatory Visit: Payer: Self-pay

## 2019-06-11 ENCOUNTER — Encounter: Payer: Self-pay | Admitting: Internal Medicine

## 2019-06-11 ENCOUNTER — Ambulatory Visit: Payer: BC Managed Care – PPO | Admitting: Internal Medicine

## 2019-06-11 VITALS — BP 138/80 | HR 108 | Temp 98.4°F | Ht 66.4 in | Wt 225.2 lb

## 2019-06-11 DIAGNOSIS — Z0000 Encounter for general adult medical examination without abnormal findings: Secondary | ICD-10-CM | POA: Diagnosis not present

## 2019-06-11 DIAGNOSIS — E1169 Type 2 diabetes mellitus with other specified complication: Secondary | ICD-10-CM

## 2019-06-11 DIAGNOSIS — Z23 Encounter for immunization: Secondary | ICD-10-CM

## 2019-06-11 DIAGNOSIS — M5442 Lumbago with sciatica, left side: Secondary | ICD-10-CM | POA: Diagnosis not present

## 2019-06-11 DIAGNOSIS — Z6835 Body mass index (BMI) 35.0-35.9, adult: Secondary | ICD-10-CM

## 2019-06-11 DIAGNOSIS — G8929 Other chronic pain: Secondary | ICD-10-CM

## 2019-06-11 DIAGNOSIS — E785 Hyperlipidemia, unspecified: Secondary | ICD-10-CM

## 2019-06-11 LAB — POCT URINALYSIS DIPSTICK
Bilirubin, UA: NEGATIVE
Blood, UA: NEGATIVE
Glucose, UA: NEGATIVE
Ketones, UA: NEGATIVE
Leukocytes, UA: NEGATIVE
Nitrite, UA: NEGATIVE
Protein, UA: NEGATIVE
Spec Grav, UA: 1.02 (ref 1.010–1.025)
Urobilinogen, UA: 0.2 E.U./dL
pH, UA: 7 (ref 5.0–8.0)

## 2019-06-11 LAB — POCT UA - MICROALBUMIN
Albumin/Creatinine Ratio, Urine, POC: 30
Creatinine, POC: 100 mg/dL
Microalbumin Ur, POC: 30 mg/L

## 2019-06-11 MED ORDER — TRAMADOL HCL 50 MG PO TABS: 50.0000 mg | ORAL_TABLET | Freq: Three times a day (TID) | ORAL | 0 refills | Status: DC | PRN

## 2019-06-11 MED ORDER — JANUMET 50-500 MG PO TABS: 1.0000 | ORAL_TABLET | Freq: Every day | ORAL | 1 refills | Status: DC

## 2019-06-11 MED ORDER — ALBUTEROL SULFATE HFA 108 (90 BASE) MCG/ACT IN AERS: 2.0000 | INHALATION_SPRAY | Freq: Four times a day (QID) | RESPIRATORY_TRACT | 3 refills | Status: DC | PRN

## 2019-06-11 MED ORDER — LIDOCAINE 5 % EX PTCH: 3.0000 | MEDICATED_PATCH | Freq: Every day | CUTANEOUS | 2 refills | Status: DC | PRN

## 2019-06-11 MED ORDER — VALACYCLOVIR HCL 500 MG PO TABS: 500.0000 mg | ORAL_TABLET | Freq: Every day | ORAL | 1 refills | Status: DC

## 2019-06-11 NOTE — Patient Instructions (Signed)
Health Maintenance, Female Adopting a healthy lifestyle and getting preventive care are important in promoting health and wellness. Ask your health care provider about:  The right schedule for you to have regular tests and exams.  Things you can do on your own to prevent diseases and keep yourself healthy. What should I know about diet, weight, and exercise? Eat a healthy diet   Eat a diet that includes plenty of vegetables, fruits, low-fat dairy products, and lean protein.  Do not eat a lot of foods that are high in solid fats, added sugars, or sodium. Maintain a healthy weight Body mass index (BMI) is used to identify weight problems. It estimates body fat based on height and weight. Your health care provider can help determine your BMI and help you achieve or maintain a healthy weight. Get regular exercise Get regular exercise. This is one of the most important things you can do for your health. Most adults should:  Exercise for at least 150 minutes each week. The exercise should increase your heart rate and make you sweat (moderate-intensity exercise).  Do strengthening exercises at least twice a week. This is in addition to the moderate-intensity exercise.  Spend less time sitting. Even light physical activity can be beneficial. Watch cholesterol and blood lipids Have your blood tested for lipids and cholesterol at 61 years of age, then have this test every 5 years. Have your cholesterol levels checked more often if:  Your lipid or cholesterol levels are high.  You are older than 61 years of age.  You are at high risk for heart disease. What should I know about cancer screening? Depending on your health history and family history, you may need to have cancer screening at various ages. This may include screening for:  Breast cancer.  Cervical cancer.  Colorectal cancer.  Skin cancer.  Lung cancer. What should I know about heart disease, diabetes, and high blood  pressure? Blood pressure and heart disease  High blood pressure causes heart disease and increases the risk of stroke. This is more likely to develop in people who have high blood pressure readings, are of African descent, or are overweight.  Have your blood pressure checked: ? Every 3-5 years if you are 18-39 years of age. ? Every year if you are 40 years old or older. Diabetes Have regular diabetes screenings. This checks your fasting blood sugar level. Have the screening done:  Once every three years after age 40 if you are at a normal weight and have a low risk for diabetes.  More often and at a younger age if you are overweight or have a high risk for diabetes. What should I know about preventing infection? Hepatitis B If you have a higher risk for hepatitis B, you should be screened for this virus. Talk with your health care provider to find out if you are at risk for hepatitis B infection. Hepatitis C Testing is recommended for:  Everyone born from 1945 through 1965.  Anyone with known risk factors for hepatitis C. Sexually transmitted infections (STIs)  Get screened for STIs, including gonorrhea and chlamydia, if: ? You are sexually active and are younger than 61 years of age. ? You are older than 61 years of age and your health care provider tells you that you are at risk for this type of infection. ? Your sexual activity has changed since you were last screened, and you are at increased risk for chlamydia or gonorrhea. Ask your health care provider if   you are at risk.  Ask your health care provider about whether you are at high risk for HIV. Your health care provider may recommend a prescription medicine to help prevent HIV infection. If you choose to take medicine to prevent HIV, you should first get tested for HIV. You should then be tested every 3 months for as long as you are taking the medicine. Pregnancy  If you are about to stop having your period (premenopausal) and  you may become pregnant, seek counseling before you get pregnant.  Take 400 to 800 micrograms (mcg) of folic acid every day if you become pregnant.  Ask for birth control (contraception) if you want to prevent pregnancy. Osteoporosis and menopause Osteoporosis is a disease in which the bones lose minerals and strength with aging. This can result in bone fractures. If you are 65 years old or older, or if you are at risk for osteoporosis and fractures, ask your health care provider if you should:  Be screened for bone loss.  Take a calcium or vitamin D supplement to lower your risk of fractures.  Be given hormone replacement therapy (HRT) to treat symptoms of menopause. Follow these instructions at home: Lifestyle  Do not use any products that contain nicotine or tobacco, such as cigarettes, e-cigarettes, and chewing tobacco. If you need help quitting, ask your health care provider.  Do not use street drugs.  Do not share needles.  Ask your health care provider for help if you need support or information about quitting drugs. Alcohol use  Do not drink alcohol if: ? Your health care provider tells you not to drink. ? You are pregnant, may be pregnant, or are planning to become pregnant.  If you drink alcohol: ? Limit how much you use to 0-1 drink a day. ? Limit intake if you are breastfeeding.  Be aware of how much alcohol is in your drink. In the [place] one drink equals one 12 oz bottle of beer (355 mL), one 5 oz glass of wine (148 mL), or one 1 oz glass of hard liquor (44 mL). General instructions  Schedule regular health, dental, and eye exams.  Stay current with your vaccines.  Tell your health care provider if: ? You often feel depressed. ? You have ever been abused or do not feel safe at home. Summary  Adopting a healthy lifestyle and getting preventive care are important in promoting health and wellness.  Follow your health care provider's instructions about healthy  diet, exercising, and getting tested or screened for diseases.  Follow your health care provider's instructions on monitoring your cholesterol and blood pressure. This information is not intended to replace advice given to you by your health care provider. Make sure you discuss any questions you have with your health care provider. Document Released: 04/02/2011 Document Revised: 09/10/2018 Document Reviewed: 09/10/2018 Elsevier Patient Education  2020 [employer]  

## 2019-06-11 NOTE — Progress Notes (Addendum)
Subjective:     Patient ID: Jasmine Rodriguez , female    DOB: 05-11-1956 , 63 y.o.   MRN: [id number]   Chief Complaint  Patient presents with  . Annual Exam  . Diabetes    HPI  She is here today for a full physical exam. She is followed by Gyn for her pelvic exams. She reports having an upcoming appt with Dr. Nori Riis. She has her mammograms performed at his office as well.   Diabetes She presents for her follow-up diabetic visit. She has type 2 diabetes mellitus. There are no hypoglycemic associated symptoms. There are no diabetic associated symptoms. There are no hypoglycemic complications. Risk factors for coronary artery disease include diabetes mellitus, dyslipidemia, sedentary lifestyle, post-menopausal and obesity. Current diabetic treatment includes oral agent (dual therapy). She is compliant with treatment most of the time. She is following a diabetic diet. She never participates in exercise.     Past Medical History:  Diagnosis Date  . DDD (degenerative disc disease)      Family History  Problem Relation Age of Onset  . Hypertension Mother   . Arthritis Mother   . Hypertension Father   . Head & neck cancer Father      Current Outpatient Medications:  .  ACCU-CHEK FASTCLIX LANCETS MISC, CHECK BLOOD SUGAR BEFORE BREAKFAST AND DINNER, Disp: 102 each, Rfl: 0 .  albuterol (PROAIR HFA) 108 (90 Base) MCG/ACT inhaler, Inhale 2 puffs into the lungs every 6 (six) hours as needed for wheezing or shortness of breath., Disp: 18 g, Rfl: 3 .  benzonatate (TESSALON) 100 MG capsule, Take 1-2 capsules (100-200 mg total) by mouth 3 (three) times daily as needed., Disp: 60 capsule, Rfl: 0 .  Calcium Carb-Vit D-Soy Isoflav (CALTRATE 600 + SOY PO), Take 1 tablet by mouth daily., Disp: , Rfl:  .  cholecalciferol (VITAMIN D) 1000 UNITS tablet, Take 2,000 Units by mouth daily., Disp: , Rfl:  .  cyclobenzaprine (FLEXERIL) 10 MG tablet, Take 1 tablet (10 mg total) by mouth at bedtime as needed for  muscle spasms., Disp: 30 tablet, Rfl: 0 .  glucosamine-chondroitin 500-400 MG tablet, Take 1 tablet by mouth daily. , Disp: , Rfl:  .  ibuprofen (ADVIL,MOTRIN) 200 MG tablet, Take 800 mg by mouth every 6 (six) hours as needed. pain, Disp: , Rfl:  .  lactase (LACTAID) 3000 UNITS tablet, Take 1 tablet by mouth 3 (three) times daily as needed. Dairy consumption, Disp: , Rfl:  .  lidocaine (LIDODERM) 5 %, Place 3 patches onto the skin daily as needed. Pain.Marland KitchenMarland KitchenMarland KitchenRemove & Discard patch within 12 hours or as directed by MD, Disp: 90 patch, Rfl: 2 .  NYSTATIN powder, APPLY TO AFFECTED AREA(S) TWICE DAILY AS NEEDED., Disp: 60 g, Rfl: 0 .  promethazine-dextromethorphan (PROMETHAZINE-DM) 6.25-15 MG/5ML syrup, Take 5 mLs by mouth at bedtime as needed for cough., Disp: 100 mL, Rfl: 0 .  sitaGLIPtin-metformin (JANUMET) 50-500 MG tablet, Take 1 tablet by mouth daily., Disp: 90 tablet, Rfl: 1 .  traMADol (ULTRAM) 50 MG tablet, Take 1 tablet (50 mg total) by mouth every 8 (eight) hours as needed., Disp: 40 tablet, Rfl: 1 .  valACYclovir (VALTREX) 500 MG tablet, Take 1 tablet (500 mg total) by mouth daily., Disp: 90 tablet, Rfl: 1   Allergies  Allergen Reactions  . Skelaxin [Metaxalone] Anaphylaxis, Itching and Rash  . Lisinopril      The patient states she uses post menopausal status for birth control. Last LMP was No LMP  recorded. Patient is postmenopausal.. Negative for Dysmenorrhea Negative for: breast discharge, breast lump(s), breast pain and breast self exam. Associated symptoms include abnormal vaginal bleeding. Pertinent negatives include abnormal bleeding (hematology), anxiety, decreased libido, depression, difficulty falling sleep, dyspareunia, history of infertility, nocturia, sexual dysfunction, sleep disturbances, urinary incontinence, urinary urgency, vaginal discharge and vaginal itching. Diet regular.The patient states her exercise level is  minimal.   . The patient's tobacco use is:  Social History    Tobacco Use  Smoking Status Never Smoker  Smokeless Tobacco Never Used  . She has been exposed to passive smoke. The patient's alcohol use is:  Social History   Substance and Sexual Activity  Alcohol Use No    Review of Systems  Constitutional: Negative.   HENT: Negative.   Eyes: Negative.   Respiratory: Negative.   Cardiovascular: Negative.   Endocrine: Negative.   Genitourinary: Negative.   Musculoskeletal: Positive for back pain.  Skin: Negative.   Allergic/Immunologic: Negative.   Neurological: Negative.   Hematological: Negative.   Psychiatric/Behavioral: Negative.      Today's Vitals   06/11/19 0942  BP: 138/80  Pulse: (!) 108  Temp: 98.4 F (36.9 C)  TempSrc: Oral  SpO2: 98%  Weight: 225 lb 3.2 oz (102.2 kg)  Height: 5' 6.4" (1.687 m)   Body mass index is 35.91 kg/m.   Objective:  Physical Exam Vitals signs and nursing note reviewed.  Constitutional:      Appearance: Normal appearance.  HENT:     Head: Normocephalic and atraumatic.     Right Ear: Tympanic membrane, ear canal and external ear normal.     Left Ear: Tympanic membrane, ear canal and external ear normal.     Nose: Nose normal.     Mouth/Throat:     Mouth: Mucous membranes are moist.     Pharynx: Oropharynx is clear.  Eyes:     Extraocular Movements: Extraocular movements intact.     Conjunctiva/sclera: Conjunctivae normal.     Pupils: Pupils are equal, round, and reactive to light.  Neck:     Musculoskeletal: Normal range of motion and neck supple.  Cardiovascular:     Rate and Rhythm: Normal rate and regular rhythm.     Pulses: Normal pulses.          Dorsalis pedis pulses are 2+ on the right side and 2+ on the left side.     Heart sounds: Normal heart sounds.  Pulmonary:     Effort: Pulmonary effort is normal.     Breath sounds: Normal breath sounds.  Chest:     Breasts: Tanner Score is 5.        Right: Normal.        Left: Normal.  Abdominal:     General: Abdomen is  flat. Bowel sounds are normal.     Palpations: Abdomen is soft.  Genitourinary:    Comments: deferred Musculoskeletal: Normal range of motion.  Feet:     Right foot:     Protective Sensation: 5 sites tested. 5 sites sensed.     Skin integrity: Skin integrity normal.     Toenail Condition: Right toenails are normal.     Left foot:     Protective Sensation: 5 sites tested. 5 sites sensed.     Skin integrity: Skin integrity normal.     Toenail Condition: Left toenails are normal.  Skin:    General: Skin is warm and dry.  Neurological:     General: No focal deficit present.  Mental Status: She is alert and oriented to person, place, and time.  Psychiatric:        Mood and Affect: Mood normal.        Behavior: Behavior normal.         Assessment And Plan:     1. Routine general medical examination at health care facility  A full exam was performed.  Importance of monthly self breast exams was performed.  PATIENT HAS BEEN ADVISED TO GET 30-45 MINUTES REGULAR EXERCISE NO LESS THAN FOUR TO FIVE DAYS PER WEEK - BOTH WEIGHTBEARING EXERCISES AND AEROBIC ARE RECOMMENDED.  SHE WAS ADVISED TO FOLLOW A HEALTHY DIET WITH AT LEAST SIX FRUITS/VEGGIES PER DAY, DECREASE INTAKE OF RED MEAT, AND TO INCREASE FISH INTAKE TO TWO DAYS PER WEEK.  MEATS/FISH SHOULD NOT BE FRIED, BAKED OR BROILED IS PREFERABLE.  I SUGGEST WEARING SPF 50 SUNSCREEN ON EXPOSED PARTS AND ESPECIALLY WHEN IN THE DIRECT SUNLIGHT FOR AN EXTENDED PERIOD OF TIME.  PLEASE AVOID FAST FOOD RESTAURANTS AND INCREASE YOUR WATER INTAKE.  - CMP14+EGFR - CBC - Lipid panel - Hemoglobin A1c  2. Type 2 diabetes mellitus with hyperlipidemia (HCC)  Diabetic foot exam was performed.  EKG performed, no new changes noted.   I DISCUSSED WITH THE PATIENT AT LENGTH REGARDING THE GOALS OF GLYCEMIC CONTROL AND POSSIBLE LONG-TERM COMPLICATIONS.  I  ALSO STRESSED THE IMPORTANCE OF COMPLIANCE WITH HOME GLUCOSE MONITORING, DIETARY RESTRICTIONS INCLUDING  AVOIDANCE OF SUGARY DRINKS/PROCESSED FOODS,  ALONG WITH REGULAR EXERCISE.  I  ALSO STRESSED THE IMPORTANCE OF ANNUAL EYE EXAMS, SELF FOOT CARE AND COMPLIANCE WITH OFFICE VISITS.  - EKG 12-Lead  3. Need for influenza vaccination  - Flu Vaccine QUAD 6+ mos PF IM (Fluarix Quad PF)  4. Class 2 severe obesity due to excess calories with serious comorbidity and body mass index (BMI) of 35.0 to 35.9 in adult [facility])  Importance of achieving optimal weight to decrease risk of cardiovascular disease and cancers was discussed with the patient in full detail. Importance of regular exercise was stressed to the patient.  She is encouraged to start slowly - start with 10 minutes twice daily at least three to four days per week and to gradually build to 30 minutes five days weekly. She was given tips to incorporate more activity into her daily routine - take stairs when possible, park farther away from her job, grocery stores, etc.    5. Chronic left-sided low back pain with left-sided sciatica  She was given rx lidoderm patches. She was also given rx tramadol to use prn. Review of the [place] CSRS was performed in accordance of the [place] prior to dispensing any controlled drugs.  - lidocaine (LIDODERM) 5 %; Place 3 patches onto the skin daily as needed. Pain.Marland KitchenMarland KitchenMarland KitchenRemove & Discard patch within 12 hours or as directed by MD  Dispense: 90 patch; Refill: 2   [name], MD    THE PATIENT IS ENCOURAGED TO PRACTICE SOCIAL DISTANCING DUE TO THE COVID-19 PANDEMIC.

## 2019-06-12 LAB — CMP14+EGFR
ALT: 32 IU/L (ref 0–32)
AST: 24 IU/L (ref 0–40)
Albumin/Globulin Ratio: 1.4 (ref 1.2–2.2)
Albumin: 4.3 g/dL (ref 3.8–4.8)
Alkaline Phosphatase: 117 IU/L (ref 39–117)
BUN/Creatinine Ratio: 11 — ABNORMAL LOW (ref 12–28)
BUN: 10 mg/dL (ref 8–27)
Bilirubin Total: 0.5 mg/dL (ref 0.0–1.2)
CO2: 25 mmol/L (ref 20–29)
Calcium: 9.7 mg/dL (ref 8.7–10.3)
Chloride: 102 mmol/L (ref 96–106)
Creatinine, Ser: 0.89 mg/dL (ref 0.57–1.00)
GFR calc Af Amer: 81 mL/min/{1.73_m2} (ref >59)
GFR calc non Af Amer: 70 mL/min/{1.73_m2} (ref >59)
Globulin, Total: 3.1 g/dL (ref 1.5–4.5)
Glucose: 131 mg/dL — ABNORMAL HIGH (ref 65–99)
Potassium: 4.3 mmol/L (ref 3.5–5.2)
Sodium: 140 mmol/L (ref 134–144)
Total Protein: 7.4 g/dL (ref 6.0–8.5)

## 2019-06-12 LAB — HEMOGLOBIN A1C
Est. average glucose Bld gHb Est-mCnc: 151 mg/dL
Hgb A1c MFr Bld: 6.9 % — ABNORMAL HIGH (ref 4.8–5.6)

## 2019-06-12 LAB — LIPID PANEL
Chol/HDL Ratio: 3 ratio (ref 0.0–4.4)
Cholesterol, Total: 202 mg/dL — ABNORMAL HIGH (ref 100–199)
HDL: 67 mg/dL (ref >39)
LDL Chol Calc (NIH): 121 mg/dL — ABNORMAL HIGH (ref 0–99)
Triglycerides: 79 mg/dL (ref 0–149)
VLDL Cholesterol Cal: 14 mg/dL (ref 5–40)

## 2019-06-12 LAB — CBC
Hematocrit: 37.1 % (ref 34.0–46.6)
Hemoglobin: 12.1 g/dL (ref 11.1–15.9)
MCH: 26.3 pg — ABNORMAL LOW (ref 26.6–33.0)
MCHC: 32.6 g/dL (ref 31.5–35.7)
MCV: 81 fL (ref 79–97)
Platelets: 306 10*3/uL (ref 150–450)
RBC: 4.6 x10E6/uL (ref 3.77–5.28)
RDW: 13.9 % (ref 11.7–15.4)
WBC: 10.2 10*3/uL (ref 3.4–10.8)

## 2019-07-14 ENCOUNTER — Ambulatory Visit: Payer: BC Managed Care – PPO

## 2019-07-14 ENCOUNTER — Other Ambulatory Visit: Payer: Self-pay

## 2019-07-14 VITALS — BP 136/84 | HR 95 | Temp 98.8°F | Ht 66.4 in | Wt 229.0 lb

## 2019-07-14 DIAGNOSIS — Z111 Encounter for screening for respiratory tuberculosis: Secondary | ICD-10-CM

## 2019-07-14 DIAGNOSIS — Z23 Encounter for immunization: Secondary | ICD-10-CM

## 2019-07-14 DIAGNOSIS — Z139 Encounter for screening, unspecified: Secondary | ICD-10-CM

## 2019-07-14 NOTE — Progress Notes (Signed)
TB skin test.

## 2019-07-16 ENCOUNTER — Ambulatory Visit: Payer: BC Managed Care – PPO

## 2019-07-16 NOTE — Progress Notes (Unsigned)
Patient presents today for a tb read

## 2019-07-16 NOTE — Progress Notes (Signed)
Pt presented today for TB skin test reading, results were negative

## 2019-07-23 ENCOUNTER — Other Ambulatory Visit: Payer: Self-pay | Admitting: Internal Medicine

## 2019-07-23 DIAGNOSIS — G8929 Other chronic pain: Secondary | ICD-10-CM

## 2019-07-23 DIAGNOSIS — M5442 Lumbago with sciatica, left side: Secondary | ICD-10-CM

## 2019-07-23 MED ORDER — ACCU-CHEK FASTCLIX LANCETS MISC: 0 refills | Status: DC

## 2019-07-23 MED ORDER — TRAMADOL HCL 50 MG PO TABS: 50.0000 mg | ORAL_TABLET | Freq: Three times a day (TID) | ORAL | 0 refills | Status: DC | PRN

## 2019-07-23 MED ORDER — NYSTATIN 100000 UNIT/GM EX POWD: CUTANEOUS | 0 refills | Status: DC

## 2019-07-23 NOTE — Telephone Encounter (Signed)
Tramadol refill

## 2019-08-18 ENCOUNTER — Other Ambulatory Visit: Payer: Self-pay

## 2019-08-18 ENCOUNTER — Ambulatory Visit: Payer: BC Managed Care – PPO | Admitting: Neurology

## 2019-08-18 ENCOUNTER — Encounter (INDEPENDENT_AMBULATORY_CARE_PROVIDER_SITE_OTHER): Payer: BC Managed Care – PPO | Admitting: Neurology

## 2019-08-18 DIAGNOSIS — Z0289 Encounter for other administrative examinations: Secondary | ICD-10-CM

## 2019-08-18 DIAGNOSIS — G5603 Carpal tunnel syndrome, bilateral upper limbs: Secondary | ICD-10-CM | POA: Diagnosis not present

## 2019-08-18 DIAGNOSIS — M5412 Radiculopathy, cervical region: Secondary | ICD-10-CM | POA: Diagnosis not present

## 2019-08-18 NOTE — Progress Notes (Signed)
Full Name: Johnella Crumm Gender: Female MRN #: [id number] Date of Birth: 02-Sep-1956    Visit Date: 08/18/2019 08:49 Age: 63 Years 2 Months Old Examining Physician: Arlice Colt, MD  Referring Physician: Erline Levine, MD    History: Ms. Tiedt is a 63 year old woman with pain in the right shoulder and forearm and numbness that goes into the middle 3 fingers.  She feels she has worsened over the last 6 months.  A nerve conduction and EMG study in March 2020 showed mild greater than right carpal tunnel syndrome and mild chronic right C7 radiculopathy.  On examination, she has mild weakness in the right triceps..  Nerve conduction studies: The right median, right ulnar and left median motor responses had normal distal latencies, amplitudes and forearm conduction velocities.  The right median sensory response was mildly delayed and the amplitude was mildly reduced.  The left median and right ulnar sensory responses had normal peak latencies and amplitudes.  Electromyography: Needle EMG of selected muscles of the right arm was performed.  There was mild chronic denervation noted in the right rhomboid major and triceps muscles.   The right EDC and right deltoid muscle had some polyphasic units but normal recruitment.  Other muscles in the right arm including the hand muscles were normal.  There was no spontaneous activity in any of the muscles tested.  Impression: This NCV/EMG study shows the following: 1.  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 2.  A couple muscles tested showed mild chronic denervation.  This could be due to a mild right C5 and mild right chronic C7 radiculopathy.  There was no acute denervation noted.   Changes at C5 mildly progressed compared to the previous study C7 is unchanged.  Richard A. Felecia Shelling, MD, PhD, FAAN Certified in Neurology, Clinical Neurophysiology, Sleep Medicine, Pain Medicine and  Neuroimaging Director, [place] [facility] [address], [place], [place] [address] [phone]           [facility]    Nerve / Sites Muscle Latency Ref. Amplitude Ref. Rel Amp Segments Distance Velocity Ref. Area    ms ms mV mV %  cm m/s m/s mVms  R Median - APB     Wrist APB 3.6 ?4.4 11.2 ?4.0 100 Wrist - APB 7   33.9     Upper arm APB 8.1  11.0  98.4 Upper arm - Wrist 22 49 ?49 32.6  L Median - APB     Wrist APB 3.5 ?4.4 11.3 ?4.0 100 Wrist - APB 7   35.3     Upper arm APB 7.8  10.8  95.9 Upper arm - Wrist 22 51 ?49 34.1  R Ulnar - ADM     Wrist ADM 2.9 ?3.3 12.2 ?6.0 100 Wrist - ADM 7   34.6     B.Elbow ADM 6.5  10.6  87.2 B.Elbow - Wrist 20 55 ?49 33.7     A.Elbow ADM 8.3  10.2  95.4 A.Elbow - B.Elbow 10 55 ?49 33.1         A.Elbow - Wrist               SNC    Nerve / Sites Rec. Site Peak Lat Ref.  Amp Ref. Segments Distance    ms ms V V  cm  R Median - Orthodromic (Dig II, Mid palm)  Dig II Wrist 3.6 ?3.4 6 ?10 Dig II - Wrist 13  L Median - Orthodromic (Dig II, Mid palm)     Dig II Wrist 3.3 ?3.4 11 ?10 Dig II - Wrist 13  R Ulnar - Orthodromic, (Dig V, Mid palm)     Dig V Wrist 3.1 ?3.1 5 ?5 Dig V - Wrist 1           F  Wave    Nerve F Lat Ref.   ms ms  R Ulnar - ADM 31.0 ?32.0       EMG full       EMG Summary Table    Spontaneous MUAP Recruitment  Muscle IA Fib PSW Fasc Other Amp Dur. Poly Pattern  R. Deltoid Normal None None None _______ Normal Normal 1+ Normal  R. Triceps brachii Normal None None None _______ Normal Normal 1+ Reduced  R. Biceps brachii Normal None None None _______ Normal Normal Normal Normal  R. Extensor digitorum communis Normal None None None _______ Normal Normal 1+ Normal  R. Pronator teres Normal None None None _______ Normal Normal Normal Normal  R. Flexor carpi ulnaris Normal None None None _______ Normal Normal Normal Normal  R. First dorsal  interosseous Normal None None None _______ Normal Normal Normal Normal  R. Abductor pollicis brevis Normal None None None _______ Normal Normal Normal Reduced  R. Rhomboid major Normal None None None _______ Normal Normal 1+ Reduced

## 2019-08-28 ENCOUNTER — Other Ambulatory Visit: Payer: Self-pay | Admitting: Internal Medicine

## 2019-09-07 ENCOUNTER — Encounter: Payer: Self-pay | Admitting: Internal Medicine

## 2019-09-18 ENCOUNTER — Other Ambulatory Visit: Payer: Self-pay | Admitting: Internal Medicine

## 2019-09-28 ENCOUNTER — Other Ambulatory Visit: Payer: Self-pay | Admitting: Internal Medicine

## 2019-09-28 ENCOUNTER — Telehealth: Payer: Self-pay

## 2019-09-28 ENCOUNTER — Encounter: Payer: Self-pay | Admitting: Internal Medicine

## 2019-09-28 NOTE — Telephone Encounter (Signed)
The pt was scheduled an appt for evaluation of her back pain and numbness.

## 2019-09-29 ENCOUNTER — Ambulatory Visit: Payer: BC Managed Care – PPO | Admitting: Nurse Practitioner

## 2019-09-29 ENCOUNTER — Encounter: Payer: Self-pay | Admitting: Nurse Practitioner

## 2019-09-29 ENCOUNTER — Other Ambulatory Visit: Payer: Self-pay

## 2019-09-29 VITALS — BP 142/92 | HR 91 | Temp 98.4°F | Ht 66.4 in | Wt 235.2 lb

## 2019-09-29 DIAGNOSIS — R7303 Prediabetes: Secondary | ICD-10-CM | POA: Diagnosis not present

## 2019-09-29 DIAGNOSIS — G8929 Other chronic pain: Secondary | ICD-10-CM | POA: Diagnosis not present

## 2019-09-29 DIAGNOSIS — M5412 Radiculopathy, cervical region: Secondary | ICD-10-CM

## 2019-09-29 DIAGNOSIS — M5442 Lumbago with sciatica, left side: Secondary | ICD-10-CM | POA: Diagnosis not present

## 2019-09-29 MED ORDER — PREDNISONE 10 MG (21) PO TBPK: ORAL_TABLET | ORAL | 0 refills | Status: DC

## 2019-09-29 MED ORDER — CYCLOBENZAPRINE HCL 10 MG PO TABS: 10.0000 mg | ORAL_TABLET | Freq: Every evening | ORAL | 1 refills | Status: DC | PRN

## 2019-09-29 NOTE — Progress Notes (Signed)
This visit occurred during the SARS-CoV-2 public health emergency.  Safety protocols were in place, including screening questions prior to the visit, additional usage of staff PPE, and extensive cleaning of exam room while observing appropriate contact time as indicated for disinfecting solutions.  Subjective:     Patient ID: Jasmine Rodriguez , female    DOB: [birth year] , 63 y.o.   MRN: [id number]   Chief Complaint  Patient presents with  . Back Pain    patient stated her back has been hurting worse since being at home. she stated she is kind of hurting all over     HPI  Low back pain  Radiculopathy to right shoulder - being followed by Dr. Algie Coffer and Dr. Vertell Limber.  Gabapentin, lidocaine patches and Tramadol.  Her pain is worse since working from home in the Spring.     Wt Readings from Last 3 Encounters: 09/29/19 : 235 lb 3.2 oz (106.7 kg) 07/14/19 : 229 lb (103.9 kg) 06/11/19 : 225 lb 3.2 oz (102.2 kg)    Back Pain This is a new problem. The current episode started in the past 7 days. The pain is present in the lumbar spine (center of back radiates down left leg). The quality of the pain is described as stabbing and aching. The pain does not radiate (down left leg ). Pain scale: now pain 5 on back and worse 7-8.   The patient is experiencing no pain. The pain is worse during the day. Exacerbated by: when active. Pertinent negatives include no bladder incontinence, bowel incontinence, chest pain, headaches, numbness, paresthesias, tingling or weakness. (Urinary urgency  She is scheduled to see Dr. Milta Deiters - GYN in March) Risk factors include lack of exercise, sedentary lifestyle and obesity. She has tried analgesics and home exercises for the symptoms. The treatment provided mild relief.  Diabetes She presents for her follow-up diabetic visit. She has type 2 diabetes mellitus. Her disease course has been stable. Pertinent negatives for hypoglycemia include no headaches. Pertinent negatives  for diabetes include no chest pain and no weakness. There are no hypoglycemic complications. Symptoms are stable. Pertinent negatives for diabetic complications include no CVA. Risk factors for coronary artery disease include obesity, sedentary lifestyle, dyslipidemia and diabetes mellitus. Current diabetic treatment includes oral agent (monotherapy). She is compliant with treatment all of the time. She is following a generally healthy diet. She has not had a previous visit with a dietitian. She rarely participates in exercise. There is no change in her home blood glucose trend.     Past Medical History:  Diagnosis Date  . DDD (degenerative disc disease)      Family History  Problem Relation Age of Onset  . Hypertension Mother   . Arthritis Mother   . Hypertension Father   . Head & neck cancer Father      Current Outpatient Medications:  .  Accu-Chek FastClix Lancets MISC, USE AS DIRECTED TO CHECK BLOOD SUGARS 2 TIMES PER DAY DX:E11.65, Disp: 102 each, Rfl: 0 .  albuterol (PROAIR HFA) 108 (90 Base) MCG/ACT inhaler, Inhale 2 puffs into the lungs every 6 (six) hours as needed for wheezing or shortness of breath., Disp: 18 g, Rfl: 3 .  Calcium Carb-Vit D-Soy Isoflav (CALTRATE 600 + SOY PO), Take 1 tablet by mouth daily., Disp: , Rfl:  .  cholecalciferol (VITAMIN D) 1000 UNITS tablet, Take 2,000 Units by mouth daily., Disp: , Rfl:  .  gabapentin (NEURONTIN) 100 MG capsule, Take 100 mg by mouth  3 (three) times daily., Disp: , Rfl:  .  glucosamine-chondroitin 500-400 MG tablet, Take 1 tablet by mouth daily. , Disp: , Rfl:  .  JANUMET 50-500 MG tablet, TAKE 1 TABLET BY MOUTH EVERY DAY, Disp: 90 tablet, Rfl: 1 .  lactase (LACTAID) 3000 UNITS tablet, Take 1 tablet by mouth 3 (three) times daily as needed. Dairy consumption, Disp: , Rfl:  .  lidocaine (LIDODERM) 5 %, Place 3 patches onto the skin daily as needed. Pain....Remove & Discard patch within 12 hours or as directed by MD, Disp: 90 patch,  Rfl: 2 .  nystatin (NYSTATIN) powder, Apply to affected area(s) 2 times per day, Disp: 60 g, Rfl: 0 .  traMADol (ULTRAM) 50 MG tablet, Take 1 tablet (50 mg total) by mouth every 8 (eight) hours as needed., Disp: 40 tablet, Rfl: 0 .  valACYclovir (VALTREX) 500 MG tablet, Take 1 tablet (500 mg total) by mouth daily., Disp: 90 tablet, Rfl: 1 .  benzonatate (TESSALON) 100 MG capsule, Take 1-2 capsules (100-200 mg total) by mouth 3 (three) times daily as needed. (Patient not taking: Reported on 09/29/2019), Disp: 60 capsule, Rfl: 0 .  cyclobenzaprine (FLEXERIL) 10 MG tablet, Take 1 tablet (10 mg total) by mouth at bedtime as needed for muscle spasms. (Patient not taking: Reported on 09/29/2019), Disp: 30 tablet, Rfl: 0 .  ibuprofen (ADVIL,MOTRIN) 200 MG tablet, Take 800 mg by mouth every 6 (six) hours as needed. pain, Disp: , Rfl:  .  promethazine-dextromethorphan (PROMETHAZINE-DM) 6.25-15 MG/5ML syrup, Take 5 mLs by mouth at bedtime as needed for cough. (Patient not taking: Reported on 09/29/2019), Disp: 100 mL, Rfl: 0   Allergies  Allergen Reactions  . Skelaxin [Metaxalone] Anaphylaxis, Itching and Rash  . Lisinopril      Review of Systems  Cardiovascular: Negative for chest pain.  Gastrointestinal: Negative for bowel incontinence.  Genitourinary: Negative for bladder incontinence.  Musculoskeletal: Positive for back pain.  Neurological: Negative for tingling, weakness, numbness, headaches and paresthesias.     Today's Vitals   09/29/19 1235  BP: (!) 142/92  Pulse: 91  Temp: 98.4 F (36.9 C)  TempSrc: Oral  Weight: 235 lb 3.2 oz (106.7 kg)  Height: 5' 6.4" (1.687 m)  PainSc: 4   PainLoc: Back   Body mass index is 37.51 kg/m.   Objective:  Physical Exam Vitals reviewed.  Constitutional:      General: She is not in acute distress.    Appearance: Normal appearance.  Cardiovascular:     Rate and Rhythm: Normal rate and regular rhythm.     Pulses: Normal pulses.     Heart  sounds: Normal heart sounds. No murmur.  Pulmonary:     Effort: Pulmonary effort is normal. No respiratory distress.     Breath sounds: Normal breath sounds.  Abdominal:     Tenderness: There is no abdominal tenderness.  Skin:    Capillary Refill: Capillary refill takes less than 2 seconds.  Neurological:     General: No focal deficit present.     Mental Status: She is alert and oriented to person, place, and time.  Psychiatric:        Mood and Affect: Mood normal.        Behavior: Behavior normal.        Thought Content: Thought content normal.        Judgment: Judgment normal.         Assessment And Plan:     1. Prediabetes Chronic, stable Continue with   current medications Encouraged to limit intake of sugary foods and drinks Encouraged to increase physical activity to 150 minutes per week as tolerated - Lipid Profile - CMP14+EGFR - Hemoglobin A1c  2. Cervical radiculopathy  Chronic, seems to be more flared currently due to her having to work from home  Rx for flexeril given - cyclobenzaprine (FLEXERIL) 10 MG tablet; Take 1 tablet (10 mg total) by mouth at bedtime as needed for muscle spasms.  Dispense: 30 tablet; Refill: 1  3. Chronic left-sided low back pain with left-sided sciatica  Negative lower extremity radiculopathy  Gait is guarded,   Will treat with prednisone taper and discussed the importance of having her work space to be ergonomically set up. - CMP14+EGFR - Hemoglobin A1c - cyclobenzaprine (FLEXERIL) 10 MG tablet; Take 1 tablet (10 mg total) by mouth at bedtime as needed for muscle spasms.  Dispense: 30 tablet; Refill: 1   Minette Brine, FNP    THE PATIENT IS ENCOURAGED TO PRACTICE SOCIAL DISTANCING DUE TO THE COVID-19 PANDEMIC.

## 2019-09-29 NOTE — Patient Instructions (Signed)

## 2019-09-30 ENCOUNTER — Encounter: Payer: Self-pay | Admitting: Nurse Practitioner

## 2019-09-30 LAB — CMP14+EGFR
ALT: 13 IU/L (ref 0–32)
AST: 18 IU/L (ref 0–40)
Albumin/Globulin Ratio: 1.4 (ref 1.2–2.2)
Albumin: 4 g/dL (ref 3.8–4.8)
Alkaline Phosphatase: 104 IU/L (ref 39–117)
BUN/Creatinine Ratio: 11 — ABNORMAL LOW (ref 12–28)
BUN: 10 mg/dL (ref 8–27)
Bilirubin Total: <0.2 mg/dL (ref 0.0–1.2)
CO2: 25 mmol/L (ref 20–29)
Calcium: 9.7 mg/dL (ref 8.7–10.3)
Chloride: 104 mmol/L (ref 96–106)
Creatinine, Ser: 0.94 mg/dL (ref 0.57–1.00)
GFR calc Af Amer: 76 mL/min/{1.73_m2} (ref >59)
GFR calc non Af Amer: 66 mL/min/{1.73_m2} (ref >59)
Globulin, Total: 2.9 g/dL (ref 1.5–4.5)
Glucose: 90 mg/dL (ref 65–99)
Potassium: 4.2 mmol/L (ref 3.5–5.2)
Sodium: 143 mmol/L (ref 134–144)
Total Protein: 6.9 g/dL (ref 6.0–8.5)

## 2019-09-30 LAB — LIPID PANEL
Chol/HDL Ratio: 3.1 ratio (ref 0.0–4.4)
Cholesterol, Total: 193 mg/dL (ref 100–199)
HDL: 62 mg/dL (ref >39)
LDL Chol Calc (NIH): 110 mg/dL — ABNORMAL HIGH (ref 0–99)
Triglycerides: 116 mg/dL (ref 0–149)
VLDL Cholesterol Cal: 21 mg/dL (ref 5–40)

## 2019-09-30 LAB — HEMOGLOBIN A1C
Est. average glucose Bld gHb Est-mCnc: 160 mg/dL
Hgb A1c MFr Bld: 7.2 % — ABNORMAL HIGH (ref 4.8–5.6)

## 2019-10-05 ENCOUNTER — Other Ambulatory Visit: Payer: Self-pay | Admitting: Internal Medicine

## 2019-10-05 DIAGNOSIS — G8929 Other chronic pain: Secondary | ICD-10-CM

## 2019-10-05 NOTE — Telephone Encounter (Signed)
Tramadol refill

## 2019-10-07 ENCOUNTER — Other Ambulatory Visit: Payer: Self-pay | Admitting: Internal Medicine

## 2019-10-07 ENCOUNTER — Telehealth: Payer: Self-pay

## 2019-10-07 NOTE — Telephone Encounter (Signed)
Called pt to get clarification on note for work. Pt stated that she no longer needs a note for work since she is out on leave. Informed the pt that we would no longer be able to give her a note for her back that it would have to come from ortho. Pt is understanding. Pt is going to drop off jury duty papers today.

## 2019-10-13 ENCOUNTER — Telehealth: Payer: Self-pay

## 2019-10-13 ENCOUNTER — Encounter: Payer: Self-pay | Admitting: Nurse Practitioner

## 2019-10-13 NOTE — Telephone Encounter (Signed)
I called patient to notify her that her letter for [employer] duty has been completed she stated she has an appointment on Thursday. YRL,RMA

## 2019-10-15 ENCOUNTER — Other Ambulatory Visit: Payer: Self-pay

## 2019-10-15 ENCOUNTER — Encounter: Payer: Self-pay | Admitting: Internal Medicine

## 2019-10-15 ENCOUNTER — Ambulatory Visit: Payer: BC Managed Care – PPO | Admitting: Internal Medicine

## 2019-10-15 VITALS — BP 130/82 | HR 84 | Temp 98.8°F | Ht 66.4 in | Wt 229.4 lb

## 2019-10-15 DIAGNOSIS — Z23 Encounter for immunization: Secondary | ICD-10-CM | POA: Diagnosis not present

## 2019-10-15 DIAGNOSIS — R4589 Other symptoms and signs involving emotional state: Secondary | ICD-10-CM

## 2019-10-15 DIAGNOSIS — E1169 Type 2 diabetes mellitus with other specified complication: Secondary | ICD-10-CM

## 2019-10-15 DIAGNOSIS — E785 Hyperlipidemia, unspecified: Secondary | ICD-10-CM | POA: Diagnosis not present

## 2019-10-15 DIAGNOSIS — Z6836 Body mass index (BMI) 36.0-36.9, adult: Secondary | ICD-10-CM

## 2019-10-15 MED ORDER — VALACYCLOVIR HCL 500 MG PO TABS: 500.0000 mg | ORAL_TABLET | Freq: Every day | ORAL | 1 refills | Status: DC

## 2019-10-15 MED ORDER — PRAVASTATIN SODIUM 40 MG PO TABS: 40.0000 mg | ORAL_TABLET | Freq: Every evening | ORAL | 1 refills | Status: DC

## 2019-10-15 NOTE — Patient Instructions (Signed)

## 2019-10-15 NOTE — Progress Notes (Addendum)
This visit occurred during the SARS-CoV-2 public health emergency.  Safety protocols were in place, including screening questions prior to the visit, additional usage of staff PPE, and extensive cleaning of exam room while observing appropriate contact time as indicated for disinfecting solutions.  Subjective:     Patient ID: Jasmine Rodriguez , female    DOB: 06-19-1956 , 64 y.o.   MRN: [id number]   Chief Complaint  Patient presents with  . Diabetes  . Immunizations    Tdap    HPI  She presents today for diabetes check. She is agreeable to updating her immunizations today.   Diabetes She presents for her follow-up diabetic visit. She has type 2 diabetes mellitus. There are no hypoglycemic associated symptoms. Pertinent negatives for diabetes include no blurred vision and no chest pain. There are no hypoglycemic complications. Risk factors for coronary artery disease include diabetes mellitus, obesity, dyslipidemia, sedentary lifestyle and post-menopausal. She is compliant with treatment most of the time. She is following a diabetic diet. She participates in exercise intermittently.     Past Medical History:  Diagnosis Date  . DDD (degenerative disc disease)      Family History  Problem Relation Age of Onset  . Hypertension Mother   . Arthritis Mother   . Congestive Heart Failure Mother   . Hypertension Father   . Head & neck cancer Father      Current Outpatient Medications:  .  Accu-Chek FastClix Lancets MISC, USE AS DIRECTED TO CHECK BLOOD SUGARS 2 TIMES PER DAY DX:E11.65, Disp: 102 each, Rfl: 0 .  albuterol (PROAIR HFA) 108 (90 Base) MCG/ACT inhaler, Inhale 2 puffs into the lungs every 6 (six) hours as needed for wheezing or shortness of breath., Disp: 18 g, Rfl: 3 .  benzonatate (TESSALON) 100 MG capsule, Take 1-2 capsules (100-200 mg total) by mouth 3 (three) times daily as needed., Disp: 60 capsule, Rfl: 0 .  Calcium Carb-Vit D-Soy Isoflav (CALTRATE 600 + SOY PO), Take  1 tablet by mouth daily., Disp: , Rfl:  .  cholecalciferol (VITAMIN D) 1000 UNITS tablet, Take 2,000 Units by mouth daily., Disp: , Rfl:  .  cyclobenzaprine (FLEXERIL) 10 MG tablet, Take 1 tablet (10 mg total) by mouth at bedtime as needed for muscle spasms., Disp: 30 tablet, Rfl: 1 .  gabapentin (NEURONTIN) 100 MG capsule, Take 100 mg by mouth 3 (three) times daily., Disp: , Rfl:  .  glucosamine-chondroitin 500-400 MG tablet, Take 1 tablet by mouth 2 (two) times daily. , Disp: , Rfl:  .  ibuprofen (ADVIL,MOTRIN) 200 MG tablet, Take 800 mg by mouth every 6 (six) hours as needed. pain, Disp: , Rfl:  .  JANUMET 50-500 MG tablet, TAKE 1 TABLET BY MOUTH EVERY DAY, Disp: 90 tablet, Rfl: 1 .  lactase (LACTAID) 3000 UNITS tablet, Take 1 tablet by mouth 3 (three) times daily as needed. Dairy consumption, Disp: , Rfl:  .  lidocaine (LIDODERM) 5 %, Place 3 patches onto the skin daily as needed. Pain.Marland KitchenMarland KitchenMarland KitchenRemove & Discard patch within 12 hours or as directed by MD, Disp: 90 patch, Rfl: 2 .  nystatin (MYCOSTATIN/NYSTOP) powder, APPLY TO AFFECTED AREA TWICE A DAY, Disp: 60 g, Rfl: 0 .  traMADol (ULTRAM) 50 MG tablet, TAKE 1 TABLET BY MOUTH EVERY 8 HOURS AS NEEDED, Disp: 30 tablet, Rfl: 1 .  valACYclovir (VALTREX) 500 MG tablet, Take 1 tablet (500 mg total) by mouth daily., Disp: 90 tablet, Rfl: 1 .  pravastatin (PRAVACHOL) 40 MG tablet, Take  1 tablet (40 mg total) by mouth every evening., Disp: 90 tablet, Rfl: 1 .  promethazine-dextromethorphan (PROMETHAZINE-DM) 6.25-15 MG/5ML syrup, Take 5 mLs by mouth at bedtime as needed for cough. (Patient not taking: Reported on 09/29/2019), Disp: 100 mL, Rfl: 0   Allergies  Allergen Reactions  . Skelaxin [Metaxalone] Anaphylaxis, Itching and Rash  . Lisinopril      Review of Systems  Constitutional: Negative.   Eyes: Negative for blurred vision.  Respiratory: Negative.   Cardiovascular: Negative.  Negative for chest pain.  Gastrointestinal: Negative.    Neurological: Negative.   Psychiatric/Behavioral: Positive for dysphoric mood.       She reports having depressed mood. She reports increased stress at work. Her Mom was recently hospitalized as well. Denies wanting to harm herself or others.      Today's Vitals   10/15/19 0909  BP: 130/82  Pulse: 84  Temp: 98.8 F (37.1 C)  TempSrc: Oral  Weight: 229 lb 6.4 oz (104.1 kg)  Height: 5' 6.4" (1.687 m)   Body mass index is 36.58 kg/m.   Objective:  Physical Exam Vitals and nursing note reviewed.  Constitutional:      Appearance: Normal appearance.  HENT:     Head: Normocephalic and atraumatic.  Cardiovascular:     Rate and Rhythm: Normal rate and regular rhythm.     Heart sounds: Normal heart sounds.  Pulmonary:     Effort: Pulmonary effort is normal.     Breath sounds: Normal breath sounds.  Skin:    General: Skin is warm.  Neurological:     General: No focal deficit present.     Mental Status: She is alert.  Psychiatric:        Mood and Affect: Mood normal.        Behavior: Behavior normal.         Assessment And Plan:     1. Type 2 diabetes mellitus with hyperlipidemia (HCC)  We discussed adding a statin to her current regimen. She is agreeable to starting pravastatin. She agrees to rto in six weeks for a cholesterol check. Her last a1c was Dec 2020 and 7.2. she is encouraged to incorporate more exercise into her daily routine.   - Lipid panel; Future - Liver Profile; Future  2. Depressed mood  She does not wish to start medication. She does agree to counseling.   - Ambulatory referral to Psychology  3. Class 2 severe obesity due to excess calories with serious comorbidity and body mass index (BMI) of 36.0 to 36.9 in adult [facility])  She is encouraged to strive for BMI less than 30 to decrease cardiac risk. She is encouraged to gradually incorporate more exercise into her daily routine.   4. Need for vaccination  - Tdap vaccine greater than or equal to 7yo  IM   [name], MD    THE PATIENT IS ENCOURAGED TO PRACTICE SOCIAL DISTANCING DUE TO THE COVID-19 PANDEMIC.

## 2019-11-22 ENCOUNTER — Other Ambulatory Visit: Payer: Self-pay | Admitting: Internal Medicine

## 2019-11-24 ENCOUNTER — Other Ambulatory Visit: Payer: Self-pay

## 2019-11-24 MED ORDER — ACCU-CHEK SMARTVIEW VI STRP: ORAL_STRIP | 2 refills | Status: DC

## 2019-11-26 ENCOUNTER — Other Ambulatory Visit: Payer: Self-pay | Admitting: Internal Medicine

## 2019-12-10 ENCOUNTER — Other Ambulatory Visit: Payer: Self-pay

## 2019-12-10 ENCOUNTER — Other Ambulatory Visit: Payer: BC Managed Care – PPO

## 2019-12-10 DIAGNOSIS — E1169 Type 2 diabetes mellitus with other specified complication: Secondary | ICD-10-CM

## 2019-12-10 DIAGNOSIS — E785 Hyperlipidemia, unspecified: Secondary | ICD-10-CM

## 2019-12-11 LAB — LIPID PANEL
Chol/HDL Ratio: 2.6 ratio (ref 0.0–4.4)
Cholesterol, Total: 153 mg/dL (ref 100–199)
HDL: 59 mg/dL (ref >39)
LDL Chol Calc (NIH): 82 mg/dL (ref 0–99)
Triglycerides: 61 mg/dL (ref 0–149)
VLDL Cholesterol Cal: 12 mg/dL (ref 5–40)

## 2019-12-11 LAB — HEPATIC FUNCTION PANEL
ALT: 25 IU/L (ref 0–32)
AST: 21 IU/L (ref 0–40)
Albumin: 4.3 g/dL (ref 3.8–4.8)
Alkaline Phosphatase: 111 IU/L (ref 39–117)
Bilirubin Total: 0.3 mg/dL (ref 0.0–1.2)
Bilirubin, Direct: 0.1 mg/dL (ref 0.00–0.40)
Total Protein: 6.9 g/dL (ref 6.0–8.5)

## 2020-01-11 ENCOUNTER — Encounter: Payer: Self-pay | Admitting: Internal Medicine

## 2020-01-11 ENCOUNTER — Telehealth: Payer: Self-pay

## 2020-01-11 NOTE — Telephone Encounter (Signed)
Pt called she has a welp on her arm from her COVID vaccine she obtained on Saturday 01/09/2020 Per RS pt should take some benadryl, also benadryl cream on the area. She can also take a pepcid  Pt is ok with this.

## 2020-02-18 ENCOUNTER — Ambulatory Visit: Payer: BC Managed Care – PPO | Admitting: Internal Medicine

## 2020-02-18 ENCOUNTER — Encounter: Payer: Self-pay | Admitting: Internal Medicine

## 2020-02-18 ENCOUNTER — Other Ambulatory Visit: Payer: Self-pay

## 2020-02-18 VITALS — BP 118/76 | HR 82 | Temp 98.0°F | Ht 66.4 in | Wt 225.6 lb

## 2020-02-18 DIAGNOSIS — E1169 Type 2 diabetes mellitus with other specified complication: Secondary | ICD-10-CM | POA: Diagnosis not present

## 2020-02-18 DIAGNOSIS — E785 Hyperlipidemia, unspecified: Secondary | ICD-10-CM

## 2020-02-18 DIAGNOSIS — L659 Nonscarring hair loss, unspecified: Secondary | ICD-10-CM

## 2020-02-18 DIAGNOSIS — Z6835 Body mass index (BMI) 35.0-35.9, adult: Secondary | ICD-10-CM

## 2020-02-18 NOTE — Patient Instructions (Addendum)
Rybelsus - oral, once daily (Improve pancreas function) Farxiga/Jardiance - once daily (urinate excess glucose)    Exercising to Stay Healthy To become healthy and stay healthy, it is recommended that you do moderate-intensity and vigorous-intensity exercise. You can tell that you are exercising at a moderate intensity if your heart starts beating faster and you start breathing faster but can still hold a conversation. You can tell that you are exercising at a vigorous intensity if you are breathing much harder and faster and cannot hold a conversation while exercising. Exercising regularly is important. It has many health benefits, such as:  Improving overall fitness, flexibility, and endurance.  Increasing bone density.  Helping with weight control.  Decreasing body fat.  Increasing muscle strength.  Reducing stress and tension.  Improving overall health. How often should I exercise? Choose an activity that you enjoy, and set realistic goals. Your health care provider can help you make an activity plan that works for you. Exercise regularly as told by your health care provider. This may include:  Doing strength training two times a week, such as: ? Lifting weights. ? Using resistance bands. ? Push-ups. ? Sit-ups. ? Yoga.  Doing a certain intensity of exercise for a given amount of time. Choose from these options: ? A total of 150 minutes of moderate-intensity exercise every week. ? A total of 75 minutes of vigorous-intensity exercise every week. ? A mix of moderate-intensity and vigorous-intensity exercise every week. Children, pregnant women, people who have not exercised regularly, people who are overweight, and older adults may need to talk with a health care provider about what activities are safe to do. If you have a medical condition, be sure to talk with your health care provider before you start a new exercise program. What are some exercise ideas? Moderate-intensity  exercise ideas include:  Walking 1 mile (1.6 km) in about 15 minutes.  Biking.  Hiking.  Golfing.  Dancing.  Water aerobics. Vigorous-intensity exercise ideas include:  Walking 4.5 miles (7.2 km) or more in about 1 hour.  Jogging or running 5 miles (8 km) in about 1 hour.  Biking 10 miles (16.1 km) or more in about 1 hour.  Lap swimming.  Roller-skating or in-line skating.  Cross-country skiing.  Vigorous competitive sports, such as football, basketball, and soccer.  Jumping rope.  Aerobic dancing. What are some everyday activities that can help me to get exercise?  [place] work, such as: ? Pushing a Conservation officer, nature. ? Raking and bagging leaves.  Washing your car.  Pushing a stroller.  Shoveling snow.  Gardening.  Washing windows or floors. How can I be more active in my day-to-day activities?  Use stairs instead of an elevator.  Take a walk during your lunch break.  If you drive, park your car farther away from your work or school.  If you take public transportation, get off one stop early and walk the rest of the way.  Stand up or walk around during all of your indoor phone calls.  Get up, stretch, and walk around every 30 minutes throughout the day.  Enjoy exercise with a friend. Support to continue exercising will help you keep a regular routine of activity. What guidelines can I follow while exercising?  Before you start a new exercise program, talk with your health care provider.  Do not exercise so much that you hurt yourself, feel dizzy, or get very short of breath.  Wear comfortable clothes and wear shoes with good support.  Drink  plenty of water while you exercise to prevent dehydration or heat stroke.  Work out until your breathing and your heartbeat get faster. Where to find more information  [place] Department of Health and Human Services: ThisPath.fi  Centers for Disease Control and Prevention (CDC): FootballExhibition.com.br Summary  Exercising  regularly is important. It will improve your overall fitness, flexibility, and endurance.  Regular exercise also will improve your overall health. It can help you control your weight, reduce stress, and improve your bone density.  Do not exercise so much that you hurt yourself, feel dizzy, or get very short of breath.  Before you start a new exercise program, talk with your health care provider. This information is not intended to replace advice given to you by your health care provider. Make sure you discuss any questions you have with your health care provider. Document Revised: 08/30/2017 Document Reviewed: 08/08/2017 Elsevier Patient Education  2020 [employer].

## 2020-02-18 NOTE — Progress Notes (Signed)
This visit occurred during the SARS-CoV-2 public health emergency.  Safety protocols were in place, including screening questions prior to the visit, additional usage of staff PPE, and extensive cleaning of exam room while observing appropriate contact time as indicated for disinfecting solutions.  Subjective:     Patient ID: Jasmine Rodriguez , female    DOB: 04-11-1956 , 64 y.o.   MRN: [id number]   Chief Complaint  Patient presents with  . Diabetes    HPI  She presents today for diabetes check.   Diabetes She presents for her follow-up diabetic visit. She has type 2 diabetes mellitus. There are no hypoglycemic associated symptoms. Pertinent negatives for diabetes include no blurred vision and no chest pain. There are no hypoglycemic complications. Risk factors for coronary artery disease include diabetes mellitus, obesity, dyslipidemia, sedentary lifestyle and post-menopausal. She is compliant with treatment most of the time. She is following a diabetic diet. She participates in exercise intermittently.     Past Medical History:  Diagnosis Date  . DDD (degenerative disc disease)      Family History  Problem Relation Age of Onset  . Hypertension Mother   . Arthritis Mother   . Congestive Heart Failure Mother   . Hypertension Father   . Head & neck cancer Father      Current Outpatient Medications:  .  Accu-Chek FastClix Lancets MISC, USE AS DIRECTED TO CHECK BLOOD SUGARS 2 TIMES PER DAY DX:E11.65, Disp: 102 each, Rfl: 0 .  albuterol (PROAIR HFA) 108 (90 Base) MCG/ACT inhaler, Inhale 2 puffs into the lungs every 6 (six) hours as needed for wheezing or shortness of breath., Disp: 18 g, Rfl: 3 .  benzonatate (TESSALON) 100 MG capsule, Take 1-2 capsules (100-200 mg total) by mouth 3 (three) times daily as needed., Disp: 60 capsule, Rfl: 0 .  cholecalciferol (VITAMIN D) 1000 UNITS tablet, Take 2,000 Units by mouth daily., Disp: , Rfl:  .  cyclobenzaprine (FLEXERIL) 10 MG tablet,  Take 1 tablet (10 mg total) by mouth at bedtime as needed for muscle spasms., Disp: 30 tablet, Rfl: 1 .  gabapentin (NEURONTIN) 300 MG capsule, Take 300 mg by mouth 3 (three) times daily. , Disp: , Rfl:  .  glucosamine-chondroitin 500-400 MG tablet, Take 1 tablet by mouth 2 (two) times daily. , Disp: , Rfl:  .  glucose blood (ACCU-CHEK SMARTVIEW) test strip, Use as instructed to check blood sugars daily dx: e11.22, Disp: 50 each, Rfl: 2 .  ibuprofen (ADVIL) 600 MG tablet, Take 600 mg by mouth every 6 (six) hours as needed. pain , Disp: , Rfl:  .  JANUMET 50-500 MG tablet, TAKE 1 TABLET BY MOUTH EVERY DAY, Disp: 90 tablet, Rfl: 1 .  lactase (LACTAID) 3000 UNITS tablet, Take 1 tablet by mouth 3 (three) times daily as needed. Dairy consumption, Disp: , Rfl:  .  lidocaine (LIDODERM) 5 %, Place 3 patches onto the skin daily as needed. Pain.Marland KitchenMarland KitchenMarland KitchenRemove & Discard patch within 12 hours or as directed by MD, Disp: 90 patch, Rfl: 2 .  nystatin (MYCOSTATIN/NYSTOP) powder, APPLY TO AFFECTED AREA TWICE A DAY, Disp: 60 g, Rfl: 0 .  pravastatin (PRAVACHOL) 40 MG tablet, Take 1 tablet (40 mg total) by mouth every evening., Disp: 90 tablet, Rfl: 1 .  traMADol (ULTRAM) 50 MG tablet, TAKE 1 TABLET BY MOUTH EVERY 8 HOURS AS NEEDED, Disp: 30 tablet, Rfl: 1 .  valACYclovir (VALTREX) 500 MG tablet, Take 1 tablet (500 mg total) by mouth daily., Disp: 90  tablet, Rfl: 1 .  Calcium Carb-Vit D-Soy Isoflav (CALTRATE 600 + SOY PO), Take 1 tablet by mouth daily., Disp: , Rfl:  .  promethazine-dextromethorphan (PROMETHAZINE-DM) 6.25-15 MG/5ML syrup, Take 5 mLs by mouth at bedtime as needed for cough. (Patient not taking: Reported on 09/29/2019), Disp: 100 mL, Rfl: 0   Allergies  Allergen Reactions  . Skelaxin [Metaxalone] Anaphylaxis, Itching and Rash  . Lisinopril      Review of Systems  Constitutional: Negative.   Eyes: Negative for blurred vision.  Respiratory: Negative.   Cardiovascular: Negative.  Negative for chest  pain.  Gastrointestinal: Negative.   Skin:       She c/o hair thinning. She is not sure what is causing her sx. She denies change in appetite. She is more stressed, helping to care for her Mom who had stroke earlier this year.   Neurological: Negative.   Psychiatric/Behavioral: Negative.      Today's Vitals   02/18/20 1000  BP: 118/76  Pulse: 82  Temp: 98 F (36.7 C)  TempSrc: Oral  Weight: 225 lb 9.6 oz (102.3 kg)  Height: 5' 6.4" (1.687 m)   Body mass index is 35.98 kg/m.   Objective:  Physical Exam Vitals and nursing note reviewed.  Constitutional:      Appearance: Normal appearance.  HENT:     Head: Normocephalic and atraumatic.  Cardiovascular:     Rate and Rhythm: Normal rate and regular rhythm.     Heart sounds: Normal heart sounds.  Pulmonary:     Effort: Pulmonary effort is normal.     Breath sounds: Normal breath sounds.  Skin:    General: Skin is warm.  Neurological:     General: No focal deficit present.     Mental Status: She is alert.  Psychiatric:        Mood and Affect: Mood normal.        Behavior: Behavior normal.         Assessment And Plan:     1. Type 2 diabetes mellitus with hyperlipidemia (HCC)  Chronic, I will check an a1c today. I will also check her renal function today. She is encouraged to incorporate more exercise into her daily routine.  - Hemoglobin A1c  2. Class 2 severe obesity due to excess calories with serious comorbidity and body mass index (BMI) of 35.0 to 35.9 in adult [facility])  She was congratulated on her 4 pound weight loss since her last visit. Encouraged to strive for BMI less than 30 to decrease cardiac risk.   3. Hair thinning  I will check labs as listed below. I will order additional testing as indicated. She is encouraged to stay well hydrated and to include healthy fats in her diet.   - TSH - ANA, IFA (with reflex)   [name], MD    THE PATIENT IS ENCOURAGED TO PRACTICE SOCIAL DISTANCING DUE TO  THE COVID-19 PANDEMIC.

## 2020-02-19 ENCOUNTER — Encounter: Payer: Self-pay | Admitting: Internal Medicine

## 2020-02-22 ENCOUNTER — Encounter: Payer: Self-pay | Admitting: Internal Medicine

## 2020-02-23 LAB — BMP8+EGFR
BUN/Creatinine Ratio: 14 (ref 12–28)
BUN: 13 mg/dL (ref 8–27)
CO2: 27 mmol/L (ref 20–29)
Calcium: 9.6 mg/dL (ref 8.7–10.3)
Chloride: 102 mmol/L (ref 96–106)
Creatinine, Ser: 0.91 mg/dL (ref 0.57–1.00)
GFR calc Af Amer: 79 mL/min/{1.73_m2} (ref >59)
GFR calc non Af Amer: 68 mL/min/{1.73_m2} (ref >59)
Glucose: 138 mg/dL — ABNORMAL HIGH (ref 65–99)
Potassium: 4.3 mmol/L (ref 3.5–5.2)
Sodium: 141 mmol/L (ref 134–144)

## 2020-02-23 LAB — TSH: TSH: 1.4 u[IU]/mL (ref 0.450–4.500)

## 2020-02-23 LAB — FANA STAINING PATTERNS: Homogeneous Pattern: 1:80 {titer}

## 2020-02-23 LAB — ANTINUCLEAR ANTIBODIES, IFA: ANA Titer 1: POSITIVE — AB

## 2020-02-23 LAB — HEMOGLOBIN A1C
Est. average glucose Bld gHb Est-mCnc: 157 mg/dL
Hgb A1c MFr Bld: 7.1 % — ABNORMAL HIGH (ref 4.8–5.6)

## 2020-02-24 ENCOUNTER — Encounter: Payer: Self-pay | Admitting: Internal Medicine

## 2020-03-15 ENCOUNTER — Encounter: Payer: Self-pay | Admitting: Internal Medicine

## 2020-03-16 ENCOUNTER — Other Ambulatory Visit: Payer: Self-pay | Admitting: Internal Medicine

## 2020-03-16 DIAGNOSIS — R768 Other specified abnormal immunological findings in serum: Secondary | ICD-10-CM

## 2020-03-16 MED ORDER — ALBUTEROL SULFATE HFA 108 (90 BASE) MCG/ACT IN AERS: 2.0000 | INHALATION_SPRAY | Freq: Four times a day (QID) | RESPIRATORY_TRACT | 3 refills | Status: DC | PRN

## 2020-03-16 MED ORDER — NYSTATIN 100000 UNIT/GM EX POWD: CUTANEOUS | 0 refills | Status: DC

## 2020-03-16 NOTE — Progress Notes (Signed)
ref

## 2020-03-22 LAB — HM DEXA SCAN: HM Dexa Scan: NORMAL

## 2020-03-22 LAB — HM MAMMOGRAPHY: HM Mammogram: NORMAL (ref 0–4)

## 2020-04-03 ENCOUNTER — Other Ambulatory Visit: Payer: Self-pay | Admitting: Internal Medicine

## 2020-04-08 ENCOUNTER — Encounter: Payer: Self-pay | Admitting: Internal Medicine

## 2020-04-13 NOTE — Progress Notes (Deleted)
PATIENT: Jasmine Rodriguez DOB: 09-27-1956  REASON FOR VISIT: follow up HISTORY FROM: patient  HISTORY OF PRESENT ILLNESS: Today 04/13/20  HISTORY   REVIEW OF SYSTEMS: Out of a complete 14 system review of symptoms, the patient complains only of the following symptoms, and all other reviewed systems are negative.  ALLERGIES: Allergies  Allergen Reactions  . Skelaxin [Metaxalone] Anaphylaxis, Itching and Rash  . Lisinopril     HOME MEDICATIONS: Outpatient Medications Prior to Visit  Medication Sig Dispense Refill  . Accu-Chek FastClix Lancets MISC USE AS DIRECTED TO CHECK BLOOD SUGARS 2 TIMES PER DAY DX:E11.65 102 each 0  . ACCU-CHEK SMARTVIEW test strip USE AS INSTRUCTED TO CHECK BLOOD SUGARS DAILY DX: E11.22 50 strip 2  . albuterol (PROAIR HFA) 108 (90 Base) MCG/ACT inhaler Inhale 2 puffs into the lungs every 6 (six) hours as needed for wheezing or shortness of breath. 18 g 3  . benzonatate (TESSALON) 100 MG capsule Take 1-2 capsules (100-200 mg total) by mouth 3 (three) times daily as needed. 60 capsule 0  . cholecalciferol (VITAMIN D) 1000 UNITS tablet Take 2,000 Units by mouth daily.    . cyclobenzaprine (FLEXERIL) 10 MG tablet Take 1 tablet (10 mg total) by mouth at bedtime as needed for muscle spasms. 30 tablet 1  . gabapentin (NEURONTIN) 300 MG capsule Take 300 mg by mouth 3 (three) times daily.     Marland Kitchen glucosamine-chondroitin 500-400 MG tablet Take 1 tablet by mouth 2 (two) times daily.     Marland Kitchen ibuprofen (ADVIL) 600 MG tablet Take 600 mg by mouth every 6 (six) hours as needed. pain     . JANUMET 50-500 MG tablet TAKE 1 TABLET BY MOUTH EVERY DAY 90 tablet 1  . lactase (LACTAID) 3000 UNITS tablet Take 1 tablet by mouth 3 (three) times daily as needed. Dairy consumption    . lidocaine (LIDODERM) 5 % Place 3 patches onto the skin daily as needed. Pain.Marland KitchenMarland KitchenMarland KitchenRemove & Discard patch within 12 hours or as directed by MD 90 patch 2  . nystatin (MYCOSTATIN/NYSTOP) powder Use as directed  60 g 0  . pravastatin (PRAVACHOL) 40 MG tablet Take 1 tablet (40 mg total) by mouth every evening. 90 tablet 1  . traMADol (ULTRAM) 50 MG tablet TAKE 1 TABLET BY MOUTH EVERY 8 HOURS AS NEEDED 30 tablet 1  . valACYclovir (VALTREX) 500 MG tablet Take 1 tablet (500 mg total) by mouth daily. 90 tablet 1   No facility-administered medications prior to visit.    PAST MEDICAL HISTORY: Past Medical History:  Diagnosis Date  . DDD (degenerative disc disease)     PAST SURGICAL HISTORY: Past Surgical History:  Procedure Laterality Date  . BREAST REDUCTION SURGERY    . CESAREAN SECTION    . KNEE SURGERY    . TUBAL LIGATION      FAMILY HISTORY: Family History  Problem Relation Age of Onset  . Hypertension Mother   . Arthritis Mother   . Congestive Heart Failure Mother   . Hypertension Father   . Head & neck cancer Father     SOCIAL HISTORY: Social History   Socioeconomic History  . Marital status: Married    Spouse name: Not on file  . Number of children: Not on file  . Years of education: Not on file  . Highest education level: Not on file  Occupational History  . Not on file  Tobacco Use  . Smoking status: Never Smoker  . Smokeless tobacco: Never  Used  Vaping Use  . Vaping Use: Never used  Substance and Sexual Activity  . Alcohol use: No  . Drug use: No  . Sexual activity: Not on file  Other Topics Concern  . Not on file  Social History Narrative  . Not on file   Social Determinants of Health   Financial Resource Strain:   . Difficulty of Paying Living Expenses:   Food Insecurity:   . Worried About Programme researcher, broadcasting/film/video in the Last Year:   . Barista in the Last Year:   Transportation Needs:   . Freight forwarder (Medical):   Marland Kitchen Lack of Transportation (Non-Medical):   Physical Activity:   . Days of Exercise per Week:   . Minutes of Exercise per Session:   Stress:   . Feeling of Stress :   Social Connections:   . Frequency of Communication with  Friends and Family:   . Frequency of Social Gatherings with Friends and Family:   . Attends Religious Services:   . Active Member of Clubs or Organizations:   . Attends Banker Meetings:   Marland Kitchen Marital Status:   Intimate Partner Violence:   . Fear of Current or Ex-Partner:   . Emotionally Abused:   Marland Kitchen Physically Abused:   . Sexually Abused:       PHYSICAL EXAM  There were no vitals filed for this visit. There is no height or weight on file to calculate BMI.  Generalized: Well developed, in no acute distress   Neurological examination  Mentation: Alert oriented to time, place, history taking. Follows all commands speech and language fluent Cranial nerve II-XII: Pupils were equal round reactive to light. Extraocular movements were full, visual field were full on confrontational test. Facial sensation and strength were normal. Uvula tongue midline. Head turning and shoulder shrug  were normal and symmetric. Motor: The motor testing reveals 5 over 5 strength of all 4 extremities. Good symmetric motor tone is noted throughout.  Sensory: Sensory testing is intact to soft touch on all 4 extremities. No evidence of extinction is noted.  Coordination: Cerebellar testing reveals good finger-nose-finger and heel-to-shin bilaterally.  Gait and station: Gait is normal. Tandem gait is normal. Romberg is negative. No drift is seen.  Reflexes: Deep tendon reflexes are symmetric and normal bilaterally.   DIAGNOSTIC DATA (LABS, IMAGING, TESTING) - I reviewed patient records, labs, notes, testing and imaging myself where available.  Lab Results  Component Value Date   WBC 10.2 06/11/2019   HGB 12.1 06/11/2019   HCT 37.1 06/11/2019   MCV 81 06/11/2019   PLT 306 06/11/2019      Component Value Date/Time   NA 141 02/18/2020 1043   K 4.3 02/18/2020 1043   CL 102 02/18/2020 1043   CO2 27 02/18/2020 1043   GLUCOSE 138 (H) 02/18/2020 1043   BUN 13 02/18/2020 1043   CREATININE 0.91  02/18/2020 1043   CALCIUM 9.6 02/18/2020 1043   PROT 6.9 12/10/2019 1153   ALBUMIN 4.3 12/10/2019 1153   AST 21 12/10/2019 1153   ALT 25 12/10/2019 1153   ALKPHOS 111 12/10/2019 1153   BILITOT 0.3 12/10/2019 1153   GFRNONAA 68 02/18/2020 1043   GFRAA 79 02/18/2020 1043   Lab Results  Component Value Date   CHOL 153 12/10/2019   HDL 59 12/10/2019   LDLCALC 82 12/10/2019   TRIG 61 12/10/2019   CHOLHDL 2.6 12/10/2019   Lab Results  Component Value Date  HGBA1C 7.1 (H) 02/18/2020   Lab Results  Component Value Date   VITAMINB12 748 11/03/2018   Lab Results  Component Value Date   TSH 1.400 02/18/2020      ASSESSMENT AND PLAN 64 y.o. year old female  has a past medical history of DDD (degenerative disc disease). here with ***   I spent 15 minutes with the patient. 50% of this time was spent   Margie Ege, [place], DNP 04/13/2020, 4:08 PM [facility] [address] [place], [place] [address] [phone]

## 2020-04-14 ENCOUNTER — Encounter: Payer: Self-pay | Admitting: Internal Medicine

## 2020-04-14 ENCOUNTER — Other Ambulatory Visit: Payer: Self-pay | Admitting: Internal Medicine

## 2020-04-14 ENCOUNTER — Ambulatory Visit: Payer: Self-pay | Admitting: Neurology

## 2020-04-14 ENCOUNTER — Telehealth: Payer: Self-pay | Admitting: *Deleted

## 2020-04-14 NOTE — Telephone Encounter (Signed)
I called and was not able to reach pt as VM full. I spoke to mother, then sister, I called pt back and explained reason for not being seen today by NP, need new consult with MD, needs referral from provider (Dr. Venetia Maxon).  I relayed apology and then said we would be glad to see her just need referral. She verbalized understanding.  I cancelled appt.

## 2020-05-05 ENCOUNTER — Other Ambulatory Visit: Payer: Self-pay | Admitting: Internal Medicine

## 2020-05-14 ENCOUNTER — Other Ambulatory Visit: Payer: Self-pay | Admitting: Internal Medicine

## 2020-05-19 ENCOUNTER — Other Ambulatory Visit: Payer: Self-pay | Admitting: Internal Medicine

## 2020-05-19 DIAGNOSIS — G8929 Other chronic pain: Secondary | ICD-10-CM

## 2020-06-07 LAB — CBC AND DIFFERENTIAL
HCT: 38 (ref 36–46)
Hemoglobin: 12.3 (ref 12.0–16.0)
Neutrophils Absolute: 72
Platelets: 230 (ref 150–399)
WBC: 9.7

## 2020-06-07 LAB — CBC: RBC: 4.38 (ref 3.87–5.11)

## 2020-06-07 LAB — POCT ERYTHROCYTE SEDIMENTATION RATE, NON-AUTOMATED: Sed Rate: 59

## 2020-06-14 ENCOUNTER — Encounter: Payer: Self-pay | Admitting: Internal Medicine

## 2020-06-15 ENCOUNTER — Encounter: Payer: Self-pay | Admitting: Internal Medicine

## 2020-06-21 ENCOUNTER — Ambulatory Visit: Payer: BC Managed Care – PPO | Admitting: Internal Medicine

## 2020-06-21 ENCOUNTER — Encounter: Payer: Self-pay | Admitting: Internal Medicine

## 2020-06-21 ENCOUNTER — Other Ambulatory Visit: Payer: Self-pay

## 2020-06-21 VITALS — BP 138/82 | HR 96 | Temp 98.2°F | Ht 66.6 in | Wt 221.4 lb

## 2020-06-21 DIAGNOSIS — E559 Vitamin D deficiency, unspecified: Secondary | ICD-10-CM | POA: Diagnosis not present

## 2020-06-21 DIAGNOSIS — E785 Hyperlipidemia, unspecified: Secondary | ICD-10-CM | POA: Diagnosis not present

## 2020-06-21 DIAGNOSIS — E1169 Type 2 diabetes mellitus with other specified complication: Secondary | ICD-10-CM

## 2020-06-21 DIAGNOSIS — Z0000 Encounter for general adult medical examination without abnormal findings: Secondary | ICD-10-CM | POA: Diagnosis not present

## 2020-06-21 DIAGNOSIS — Z6835 Body mass index (BMI) 35.0-35.9, adult: Secondary | ICD-10-CM

## 2020-06-21 LAB — POCT URINALYSIS DIPSTICK
Bilirubin, UA: NEGATIVE
Glucose, UA: NEGATIVE
Ketones, UA: NEGATIVE
Leukocytes, UA: NEGATIVE
Nitrite, UA: NEGATIVE
Protein, UA: NEGATIVE
Spec Grav, UA: 1.025 (ref 1.010–1.025)
Urobilinogen, UA: 0.2 E.U./dL
pH, UA: 6 (ref 5.0–8.0)

## 2020-06-21 LAB — POCT UA - MICROALBUMIN
Albumin/Creatinine Ratio, Urine, POC: 30
Creatinine, POC: 200 mg/dL
Microalbumin Ur, POC: 30 mg/L

## 2020-06-21 NOTE — Progress Notes (Addendum)
I,Tianna Badgett,acting as a Education administrator for [name], MD.,have documented all relevant documentation on the behalf of [name], MD,as directed by  [name], MD while in the presence of [name], MD.  This visit occurred during the SARS-CoV-2 public health emergency.  Safety protocols were in place, including screening questions prior to the visit, additional usage of staff PPE, and extensive cleaning of exam room while observing appropriate contact time as indicated for disinfecting solutions.  Subjective:     Patient ID: Jasmine Rodriguez , female    DOB: [birth year] , 64 y.o.   MRN: [id number]   Chief Complaint  Patient presents with  . Annual Exam  . Diabetes    HPI  She is here today for a full physical exam. She is followed by Gyn for her pelvic exams. She reports having her annual appt with Dr. Nori Riis. She has her mammograms performed at his office as well.   Diabetes She presents for her follow-up diabetic visit. She has type 2 diabetes mellitus. There are no hypoglycemic associated symptoms. There are no diabetic associated symptoms. There are no hypoglycemic complications. Risk factors for coronary artery disease include diabetes mellitus, dyslipidemia, sedentary lifestyle, post-menopausal and obesity. Current diabetic treatment includes oral agent (dual therapy). She is compliant with treatment most of the time. She is following a diabetic diet. She never participates in exercise.     Past Medical History:  Diagnosis Date  . DDD (degenerative disc disease)      Family History  Problem Relation Age of Onset  . Hypertension Mother   . Arthritis Mother   . Congestive Heart Failure Mother   . Hypertension Father   . Head & neck cancer Father      Current Outpatient Medications:  .  Accu-Chek FastClix Lancets MISC, USE AS DIRECTED TO CHECK BLOOD SUGARS 2 TIMES PER DAY DX:E11.65, Disp: 102 each, Rfl: 0 .  ACCU-CHEK SMARTVIEW test strip, USE AS INSTRUCTED  TO CHECK BLOOD SUGARS DAILY DX: E11.22, Disp: 50 strip, Rfl: 2 .  albuterol (PROAIR HFA) 108 (90 Base) MCG/ACT inhaler, Inhale 2 puffs into the lungs every 6 (six) hours as needed for wheezing or shortness of breath., Disp: 18 g, Rfl: 3 .  cholecalciferol (VITAMIN D) 1000 UNITS tablet, Take 2,000 Units by mouth daily., Disp: , Rfl:  .  cyclobenzaprine (FLEXERIL) 10 MG tablet, Take 1 tablet (10 mg total) by mouth at bedtime as needed for muscle spasms., Disp: 30 tablet, Rfl: 1 .  glucosamine-chondroitin 500-400 MG tablet, Take 1 tablet by mouth 2 (two) times daily. , Disp: , Rfl:  .  ibuprofen (ADVIL) 600 MG tablet, Take 600 mg by mouth every 6 (six) hours as needed. pain , Disp: , Rfl:  .  JANUMET 50-500 MG tablet, TAKE 1 TABLET BY MOUTH EVERY DAY, Disp: 90 tablet, Rfl: 1 .  lactase (LACTAID) 3000 UNITS tablet, Take 1 tablet by mouth 3 (three) times daily as needed. Dairy consumption, Disp: , Rfl:  .  lidocaine (LIDODERM) 5 %, PLACE 3 PATCHES ONTO SKIN DAILY AS NEEDED FOR PAIN,REMOVE & DISCARD PATCH WITHIN 12HRS/AS DIRECTED, Disp: 90 patch, Rfl: 2 .  nystatin (MYCOSTATIN/NYSTOP) powder, Use as directed, Disp: 60 g, Rfl: 0 .  pravastatin (PRAVACHOL) 40 MG tablet, TAKE 1 TABLET BY MOUTH EVERY DAY IN THE EVENING, Disp: 90 tablet, Rfl: 1 .  traMADol (ULTRAM) 50 MG tablet, TAKE 1 TABLET BY MOUTH EVERY 8 HOURS AS NEEDED, Disp: 30 tablet, Rfl: 1 .  valACYclovir (VALTREX) 500 MG tablet, TAKE 1 TABLET BY MOUTH EVERY DAY, Disp: 90 tablet, Rfl: 1   Allergies  Allergen Reactions  . Skelaxin [Metaxalone] Anaphylaxis, Itching and Rash  . Lisinopril       The patient states she uses post menopausal status for birth control. Last LMP was No LMP recorded. Patient is postmenopausal.. Negative for Dysmenorrhea. Negative for: breast discharge, breast lump(s), breast pain and breast self exam. Associated symptoms include abnormal vaginal bleeding. Pertinent negatives include abnormal bleeding (hematology), anxiety,  decreased libido, depression, difficulty falling sleep, dyspareunia, history of infertility, nocturia, sexual dysfunction, sleep disturbances, urinary incontinence, urinary urgency, vaginal discharge and vaginal itching. Diet regular.The patient states her exercise level is    . The patient's tobacco use is:  Social History   Tobacco Use  Smoking Status Never Smoker  Smokeless Tobacco Never Used  . She has been exposed to passive smoke. The patient's alcohol use is:  Social History   Substance and Sexual Activity  Alcohol Use No    Review of Systems  Constitutional: Negative.   HENT: Negative.   Eyes: Negative.   Respiratory: Negative.   Cardiovascular: Negative.   Gastrointestinal: Negative.   Endocrine: Negative.   Genitourinary: Negative.   Musculoskeletal: Negative.   Skin: Negative.   Allergic/Immunologic: Negative.   Neurological: Negative.   Hematological: Negative.   Psychiatric/Behavioral: Negative.      Today's Vitals   06/21/20 0856  BP: 138/82  Pulse: 96  Temp: 98.2 F (36.8 C)  TempSrc: Oral  Weight: 221 lb 6.4 oz (100.4 kg)  Height: 5' 6.6" (1.692 m)   Body mass index is 35.09 kg/m.   Objective:  Physical Exam Constitutional:      General: She is not in acute distress.    Appearance: Normal appearance. She is well-developed. She is obese.  HENT:     Head: Normocephalic and atraumatic.     Right Ear: Hearing, tympanic membrane, ear canal and external ear normal. There is no impacted cerumen.     Left Ear: Hearing, tympanic membrane, ear canal and external ear normal. There is no impacted cerumen.     Nose:     Comments: Deferred, masked    Mouth/Throat:     Comments: Deferred, masked Eyes:     General: Lids are normal.     Extraocular Movements: Extraocular movements intact.     Conjunctiva/sclera: Conjunctivae normal.     Pupils: Pupils are equal, round, and reactive to light.     Funduscopic exam:    Right eye: No papilledema.        Left  eye: No papilledema.  Neck:     Thyroid: No thyroid mass.     Vascular: No carotid bruit.  Cardiovascular:     Rate and Rhythm: Normal rate and regular rhythm.     Pulses: Normal pulses.          Dorsalis pedis pulses are 2+ on the right side and 2+ on the left side.     Heart sounds: Normal heart sounds. No murmur heard.   Pulmonary:     Effort: Pulmonary effort is normal.     Breath sounds: Normal breath sounds.  Chest:     Breasts: Tanner Score is 5.        Right: Normal.        Left: Normal.     Comments: Healed surgical scars b/l Abdominal:     General: Bowel sounds are normal. There is no distension.  Palpations: Abdomen is soft.     Tenderness: There is no abdominal tenderness.  Musculoskeletal:        General: No swelling. Normal range of motion.     Cervical back: Full passive range of motion without pain, normal range of motion and neck supple.     Right lower leg: No edema.     Left lower leg: No edema.  Feet:     Right foot:     Protective Sensation: 5 sites tested. 5 sites sensed.     Skin integrity: Dry skin present.     Toenail Condition: Right toenails are normal.     Left foot:     Protective Sensation: 5 sites tested. 5 sites sensed.     Skin integrity: Dry skin present.     Toenail Condition: Left toenails are normal.  Skin:    General: Skin is warm and dry.     Capillary Refill: Capillary refill takes less than 2 seconds.  Neurological:     General: No focal deficit present.     Mental Status: She is alert and oriented to person, place, and time.     Cranial Nerves: No cranial nerve deficit.     Sensory: No sensory deficit.  Psychiatric:        Mood and Affect: Mood normal.        Behavior: Behavior normal.        Thought Content: Thought content normal.        Judgment: Judgment normal.         Assessment And Plan:     1. Routine general medical examination at health care facility Comments: A full exam was performed. Importance of  monthly self breast exams was discussed with the patient. PATIENT IS ADVISED TO GET 30-45 MINUTES REGULAR EXERCISE NO LESS THAN FOUR TO FIVE DAYS PER WEEK - BOTH WEIGHTBEARING EXERCISES AND AEROBIC ARE RECOMMENDED.  PATIENT IS ADVISED TO FOLLOW A HEALTHY DIET WITH AT LEAST SIX FRUITS/VEGGIES PER DAY, DECREASE INTAKE OF RED MEAT, AND TO INCREASE FISH INTAKE TO TWO DAYS PER WEEK.  MEATS/FISH SHOULD NOT BE FRIED, BAKED OR BROILED IS PREFERABLE.  I SUGGEST WEARING SPF 50 SUNSCREEN ON EXPOSED PARTS AND ESPECIALLY WHEN IN THE DIRECT SUNLIGHT FOR AN EXTENDED PERIOD OF TIME.  PLEASE AVOID FAST FOOD RESTAURANTS AND INCREASE YOUR WATER INTAKE.   2. Type 2 diabetes mellitus with hyperlipidemia ([place]) Comments: Diabetic foot exam was performed.  I DISCUSSED WITH THE PATIENT AT LENGTH REGARDING THE GOALS OF GLYCEMIC CONTROL AND POSSIBLE LONG-TERM COMPLICATIONS.  I  ALSO STRESSED THE IMPORTANCE OF COMPLIANCE WITH HOME GLUCOSE MONITORING, DIETARY RESTRICTIONS INCLUDING AVOIDANCE OF SUGARY DRINKS/PROCESSED FOODS,  ALONG WITH REGULAR EXERCISE.  I  ALSO STRESSED THE IMPORTANCE OF ANNUAL EYE EXAMS, SELF FOOT CARE AND COMPLIANCE WITH OFFICE VISITS.  - CBC - Hemoglobin A1c - CMP14+EGFR - EKG 12-Lead - POCT UA - Microalbumin - POCT urinalysis dipstick  3. Vitamin D deficiency Comments: I will check a vitamin D level and supplement as needed.  - VITAMIN D 25 Hydroxy (Vit-D Deficiency, Fractures)  4. Class 2 severe obesity due to excess calories with serious comorbidity and body mass index (BMI) of 35.0 to 35.9 in adult [facility]) Comments: she has lost 8 pounds since Jan 2021. She is encouraged to strive for BMI less than 30 to decrease cardiac risk. Importance of regular exercise was discussed with the patient. Advised to aim for at least 150 minutes of exercise per week. Wt Readings from  Last 3 Encounters:  06/21/20 221 lb 6.4 oz (100.4 kg)  02/18/20 225 lb 9.6 oz (102.3 kg)  10/15/19 229 lb 6.4 oz (104.1 kg)        Patient was given opportunity to ask questions. Patient verbalized understanding of the plan and was able to repeat key elements of the plan. All questions were answered to their satisfaction.   [name], MD   I, [name], MD, have reviewed all documentation for this visit. The documentation on 06/26/20 for the exam, diagnosis, procedures, and orders are all accurate and complete.  THE PATIENT IS ENCOURAGED TO PRACTICE SOCIAL DISTANCING DUE TO THE COVID-19 PANDEMIC.

## 2020-06-21 NOTE — Patient Instructions (Signed)
Health Maintenance, Female Adopting a healthy lifestyle and getting preventive care are important in promoting health and wellness. Ask your health care provider about:  The right schedule for you to have regular tests and exams.  Things you can do on your own to prevent diseases and keep yourself healthy. What should I know about diet, weight, and exercise? Eat a healthy diet   Eat a diet that includes plenty of vegetables, fruits, low-fat dairy products, and lean protein.  Do not eat a lot of foods that are high in solid fats, added sugars, or sodium. Maintain a healthy weight Body mass index (BMI) is used to identify weight problems. It estimates body fat based on height and weight. Your health care provider can help determine your BMI and help you achieve or maintain a healthy weight. Get regular exercise Get regular exercise. This is one of the most important things you can do for your health. Most adults should:  Exercise for at least 150 minutes each week. The exercise should increase your heart rate and make you sweat (moderate-intensity exercise).  Do strengthening exercises at least twice a week. This is in addition to the moderate-intensity exercise.  Spend less time sitting. Even light physical activity can be beneficial. Watch cholesterol and blood lipids Have your blood tested for lipids and cholesterol at 62 years of age, then have this test every 5 years. Have your cholesterol levels checked more often if:  Your lipid or cholesterol levels are high.  You are older than 62 years of age.  You are at high risk for heart disease. What should I know about cancer screening? Depending on your health history and family history, you may need to have cancer screening at various ages. This may include screening for:  Breast cancer.  Cervical cancer.  Colorectal cancer.  Skin cancer.  Lung cancer. What should I know about heart disease, diabetes, and high blood  pressure? Blood pressure and heart disease  High blood pressure causes heart disease and increases the risk of stroke. This is more likely to develop in people who have high blood pressure readings, are of African descent, or are overweight.  Have your blood pressure checked: ? Every 3-5 years if you are 18-39 years of age. ? Every year if you are 40 years old or older. Diabetes Have regular diabetes screenings. This checks your fasting blood sugar level. Have the screening done:  Once every three years after age 40 if you are at a normal weight and have a low risk for diabetes.  More often and at a younger age if you are overweight or have a high risk for diabetes. What should I know about preventing infection? Hepatitis B If you have a higher risk for hepatitis B, you should be screened for this virus. Talk with your health care provider to find out if you are at risk for hepatitis B infection. Hepatitis C Testing is recommended for:  Everyone born from 1945 through 1965.  Anyone with known risk factors for hepatitis C. Sexually transmitted infections (STIs)  Get screened for STIs, including gonorrhea and chlamydia, if: ? You are sexually active and are younger than 62 years of age. ? You are older than 62 years of age and your health care provider tells you that you are at risk for this type of infection. ? Your sexual activity has changed since you were last screened, and you are at increased risk for chlamydia or gonorrhea. Ask your health care provider if   you are at risk.  Ask your health care provider about whether you are at high risk for HIV. Your health care provider may recommend a prescription medicine to help prevent HIV infection. If you choose to take medicine to prevent HIV, you should first get tested for HIV. You should then be tested every 3 months for as long as you are taking the medicine. Pregnancy  If you are about to stop having your period (premenopausal) and  you may become pregnant, seek counseling before you get pregnant.  Take 400 to 800 micrograms (mcg) of folic acid every day if you become pregnant.  Ask for birth control (contraception) if you want to prevent pregnancy. Osteoporosis and menopause Osteoporosis is a disease in which the bones lose minerals and strength with aging. This can result in bone fractures. If you are 65 years old or older, or if you are at risk for osteoporosis and fractures, ask your health care provider if you should:  Be screened for bone loss.  Take a calcium or vitamin D supplement to lower your risk of fractures.  Be given hormone replacement therapy (HRT) to treat symptoms of menopause. Follow these instructions at home: Lifestyle  Do not use any products that contain nicotine or tobacco, such as cigarettes, e-cigarettes, and chewing tobacco. If you need help quitting, ask your health care provider.  Do not use street drugs.  Do not share needles.  Ask your health care provider for help if you need support or information about quitting drugs. Alcohol use  Do not drink alcohol if: ? Your health care provider tells you not to drink. ? You are pregnant, may be pregnant, or are planning to become pregnant.  If you drink alcohol: ? Limit how much you use to 0-1 drink a day. ? Limit intake if you are breastfeeding.  Be aware of how much alcohol is in your drink. In the [place] one drink equals one 12 oz bottle of beer (355 mL), one 5 oz glass of wine (148 mL), or one 1 oz glass of hard liquor (44 mL). General instructions  Schedule regular health, dental, and eye exams.  Stay current with your vaccines.  Tell your health care provider if: ? You often feel depressed. ? You have ever been abused or do not feel safe at home. Summary  Adopting a healthy lifestyle and getting preventive care are important in promoting health and wellness.  Follow your health care provider's instructions about healthy  diet, exercising, and getting tested or screened for diseases.  Follow your health care provider's instructions on monitoring your cholesterol and blood pressure. This information is not intended to replace advice given to you by your health care provider. Make sure you discuss any questions you have with your health care provider. Document Revised: 09/10/2018 Document Reviewed: 09/10/2018 Elsevier Patient Education  2020 [employer]  

## 2020-06-22 LAB — CMP14+EGFR
ALT: 40 IU/L — ABNORMAL HIGH (ref 0–32)
AST: 27 IU/L (ref 0–40)
Albumin/Globulin Ratio: 1.5 (ref 1.2–2.2)
Albumin: 4.3 g/dL (ref 3.8–4.8)
Alkaline Phosphatase: 117 IU/L (ref 44–121)
BUN/Creatinine Ratio: 13 (ref 12–28)
BUN: 12 mg/dL (ref 8–27)
Bilirubin Total: 0.2 mg/dL (ref 0.0–1.2)
CO2: 26 mmol/L (ref 20–29)
Calcium: 9.8 mg/dL (ref 8.7–10.3)
Chloride: 101 mmol/L (ref 96–106)
Creatinine, Ser: 0.92 mg/dL (ref 0.57–1.00)
GFR calc Af Amer: 77 mL/min/{1.73_m2} (ref >59)
GFR calc non Af Amer: 67 mL/min/{1.73_m2} (ref >59)
Globulin, Total: 2.8 g/dL (ref 1.5–4.5)
Glucose: 162 mg/dL — ABNORMAL HIGH (ref 65–99)
Potassium: 4.2 mmol/L (ref 3.5–5.2)
Sodium: 142 mmol/L (ref 134–144)
Total Protein: 7.1 g/dL (ref 6.0–8.5)

## 2020-06-22 LAB — CBC
Hematocrit: 37 % (ref 34.0–46.6)
Hemoglobin: 12.3 g/dL (ref 11.1–15.9)
MCH: 27.9 pg (ref 26.6–33.0)
MCHC: 33.2 g/dL (ref 31.5–35.7)
MCV: 84 fL (ref 79–97)
Platelets: 316 10*3/uL (ref 150–450)
RBC: 4.41 x10E6/uL (ref 3.77–5.28)
RDW: 14.1 % (ref 11.7–15.4)
WBC: 10.1 10*3/uL (ref 3.4–10.8)

## 2020-06-22 LAB — HEMOGLOBIN A1C
Est. average glucose Bld gHb Est-mCnc: 146 mg/dL
Hgb A1c MFr Bld: 6.7 % — ABNORMAL HIGH (ref 4.8–5.6)

## 2020-06-22 LAB — VITAMIN D 25 HYDROXY (VIT D DEFICIENCY, FRACTURES): Vit D, 25-Hydroxy: 85.1 ng/mL (ref 30.0–100.0)

## 2020-06-27 ENCOUNTER — Encounter: Payer: Self-pay | Admitting: Internal Medicine

## 2020-06-27 ENCOUNTER — Other Ambulatory Visit: Payer: Self-pay | Admitting: Internal Medicine

## 2020-06-27 DIAGNOSIS — M5412 Radiculopathy, cervical region: Secondary | ICD-10-CM

## 2020-06-27 DIAGNOSIS — M5442 Lumbago with sciatica, left side: Secondary | ICD-10-CM

## 2020-06-27 DIAGNOSIS — G8929 Other chronic pain: Secondary | ICD-10-CM

## 2020-06-29 ENCOUNTER — Other Ambulatory Visit: Payer: Self-pay | Admitting: Internal Medicine

## 2020-06-29 DIAGNOSIS — G8929 Other chronic pain: Secondary | ICD-10-CM

## 2020-06-29 MED ORDER — TRAMADOL HCL 50 MG PO TABS
50.0000 mg | ORAL_TABLET | Freq: Three times a day (TID) | ORAL | 0 refills | Status: DC | PRN
Start: 2020-06-29 — End: 2020-09-07

## 2020-07-03 ENCOUNTER — Encounter: Payer: Self-pay | Admitting: Internal Medicine

## 2020-07-05 ENCOUNTER — Encounter: Payer: Self-pay | Admitting: Internal Medicine

## 2020-07-09 ENCOUNTER — Other Ambulatory Visit: Payer: Self-pay | Admitting: Internal Medicine

## 2020-07-15 ENCOUNTER — Encounter: Payer: Self-pay | Admitting: Internal Medicine

## 2020-08-17 ENCOUNTER — Encounter: Payer: Self-pay | Admitting: Internal Medicine

## 2020-09-04 ENCOUNTER — Other Ambulatory Visit: Payer: Self-pay | Admitting: Internal Medicine

## 2020-09-04 DIAGNOSIS — G8929 Other chronic pain: Secondary | ICD-10-CM

## 2020-09-07 ENCOUNTER — Other Ambulatory Visit: Payer: Self-pay | Admitting: Internal Medicine

## 2020-09-07 ENCOUNTER — Encounter: Payer: Self-pay | Admitting: Internal Medicine

## 2020-09-07 DIAGNOSIS — G8929 Other chronic pain: Secondary | ICD-10-CM

## 2020-09-07 MED ORDER — TRAMADOL HCL 50 MG PO TABS: 50.0000 mg | ORAL_TABLET | Freq: Three times a day (TID) | ORAL | 0 refills | Status: DC | PRN

## 2020-09-07 NOTE — Telephone Encounter (Signed)
Tramadol refill

## 2020-09-08 ENCOUNTER — Other Ambulatory Visit: Payer: Self-pay | Admitting: Internal Medicine

## 2020-09-08 DIAGNOSIS — M5412 Radiculopathy, cervical region: Secondary | ICD-10-CM

## 2020-09-08 DIAGNOSIS — G8929 Other chronic pain: Secondary | ICD-10-CM

## 2020-09-14 ENCOUNTER — Encounter: Payer: Self-pay | Admitting: Physical Medicine & Rehabilitation

## 2020-09-14 ENCOUNTER — Telehealth: Payer: Self-pay

## 2020-09-14 NOTE — Telephone Encounter (Signed)
I left the pt a message to call the office back.  I was calling because the office received a  Prior authorization for the pt's Tramadol prescription and I was checking to see why.  If the pt was trying to fill the rx early or if it's not covered by the pt's insurance because the office hasn't received a prior auth for the tramadol before.

## 2020-10-04 ENCOUNTER — Other Ambulatory Visit: Payer: Self-pay | Admitting: Internal Medicine

## 2020-10-10 ENCOUNTER — Other Ambulatory Visit: Payer: Self-pay

## 2020-10-10 DIAGNOSIS — G8929 Other chronic pain: Secondary | ICD-10-CM

## 2020-10-10 DIAGNOSIS — M5442 Lumbago with sciatica, left side: Secondary | ICD-10-CM

## 2020-10-10 MED ORDER — LIDOCAINE 5 % EX PTCH: MEDICATED_PATCH | CUTANEOUS | 2 refills | Status: DC

## 2020-10-10 MED ORDER — JANUMET 50-500 MG PO TABS: 1.0000 | ORAL_TABLET | Freq: Every day | ORAL | 1 refills | Status: DC

## 2020-10-13 ENCOUNTER — Other Ambulatory Visit: Payer: Self-pay

## 2020-10-13 ENCOUNTER — Encounter: Payer: Self-pay | Admitting: Physical Medicine & Rehabilitation

## 2020-10-13 ENCOUNTER — Encounter: Payer: 59 | Attending: Physical Medicine & Rehabilitation | Admitting: Physical Medicine & Rehabilitation

## 2020-10-13 VITALS — BP 132/84 | HR 81 | Temp 97.8°F | Ht 67.0 in | Wt 217.2 lb

## 2020-10-13 DIAGNOSIS — M5441 Lumbago with sciatica, right side: Secondary | ICD-10-CM | POA: Insufficient documentation

## 2020-10-13 DIAGNOSIS — M5442 Lumbago with sciatica, left side: Secondary | ICD-10-CM | POA: Diagnosis not present

## 2020-10-13 DIAGNOSIS — G5603 Carpal tunnel syndrome, bilateral upper limbs: Secondary | ICD-10-CM | POA: Insufficient documentation

## 2020-10-13 DIAGNOSIS — M5412 Radiculopathy, cervical region: Secondary | ICD-10-CM | POA: Diagnosis not present

## 2020-10-13 NOTE — Patient Instructions (Addendum)
Back Exercises These exercises help to make your trunk and back strong. They also help to keep the lower back flexible. Doing these exercises can help to prevent back pain or lessen existing pain.  If you have back pain, try to do these exercises 2-3 times each day or as told by your doctor.  As you get better, do the exercises once each day. Repeat the exercises more often as told by your doctor.  To stop back pain from coming back, do the exercises once each day, or as told by your doctor. Exercises Single knee to chest Do these steps 3-5 times in a row for each leg: 1. Lie on your back on a firm bed or the floor with your legs stretched out. 2. Bring one knee to your chest. 3. Grab your knee or thigh with both hands and hold them it in place. 4. Pull on your knee until you feel a gentle stretch in your lower back or buttocks. 5. Keep doing the stretch for 10-30 seconds. 6. Slowly let go of your leg and straighten it. Pelvic tilt Do these steps 5-10 times in a row: 1. Lie on your back on a firm bed or the floor with your legs stretched out. 2. Bend your knees so they point up to the ceiling. Your feet should be flat on the floor. 3. Tighten your lower belly (abdomen) muscles to press your lower back against the floor. This will make your tailbone point up to the ceiling instead of pointing down to your feet or the floor. 4. Stay in this position for 5-10 seconds while you gently tighten your muscles and breathe evenly. Cat-cow Do these steps until your lower back bends more easily: 1. Get on your hands and knees on a firm surface. Keep your hands under your shoulders, and keep your knees under your hips. You may put padding under your knees. 2. Let your head hang down toward your chest. Tighten (contract) the muscles in your belly. Point your tailbone toward the floor so your lower back becomes rounded like the back of a cat. 3. Stay in this position for 5 seconds. 4. Slowly lift your  head. Let the muscles of your belly relax. Point your tailbone up toward the ceiling so your back forms a sagging arch like the back of a cow. 5. Stay in this position for 5 seconds.   Press-ups Do these steps 5-10 times in a row: 1. Lie on your belly (face-down) on the floor. 2. Place your hands near your head, about shoulder-width apart. 3. While you keep your back relaxed and keep your hips on the floor, slowly straighten your arms to raise the top half of your body and lift your shoulders. Do not use your back muscles. You may change where you place your hands in order to make yourself more comfortable. 4. Stay in this position for 5 seconds. 5. Slowly return to lying flat on the floor.   Bridges Do these steps 10 times in a row: 1. Lie on your back on a firm surface. 2. Bend your knees so they point up to the ceiling. Your feet should be flat on the floor. Your arms should be flat at your sides, next to your body. 3. Tighten your butt muscles and lift your butt off the floor until your waist is almost as high as your knees. If you do not feel the muscles working in your butt and the back of your thighs, slide your feet   1-2 inches farther away from your butt. 4. Stay in this position for 3-5 seconds. 5. Slowly lower your butt to the floor, and let your butt muscles relax. If this exercise is too easy, try doing it with your arms crossed over your chest.   Belly crunches Do these steps 5-10 times in a row: 1. Lie on your back on a firm bed or the floor with your legs stretched out. 2. Bend your knees so they point up to the ceiling. Your feet should be flat on the floor. 3. Cross your arms over your chest. 4. Tip your chin a little bit toward your chest but do not bend your neck. 5. Tighten your belly muscles and slowly raise your chest just enough to lift your shoulder blades a tiny bit off of the floor. Avoid raising your body higher than that, because it can put too much stress on your  low back. 6. Slowly lower your chest and your head to the floor. Back lifts Do these steps 5-10 times in a row: 1. Lie on your belly (face-down) with your arms at your sides, and rest your forehead on the floor. 2. Tighten the muscles in your legs and your butt. 3. Slowly lift your chest off of the floor while you keep your hips on the floor. Keep the back of your head in line with the curve in your back. Look at the floor while you do this. 4. Stay in this position for 3-5 seconds. 5. Slowly lower your chest and your face to the floor. Contact a doctor if:  Your back pain gets a lot worse when you do an exercise.  Your back pain does not get better 2 hours after you exercise. If you have any of these problems, stop doing the exercises. Do not do them again unless your doctor says it is okay. Get help right away if:  You have sudden, very bad back pain. If this happens, stop doing the exercises. Do not do them again unless your doctor says it is okay. This information is not intended to replace advice given to you by your health care provider. Make sure you discuss any questions you have with your health care provider. Document Revised: 06/12/2018 Document Reviewed: 06/12/2018 Elsevier Patient Education  2021 [employer] Take one tramadol every morning.  You should have enough on your current prescription for several months Back Exercises These exercises help to make your trunk and back strong. They also help to keep the lower back flexible. Doing these exercises can help to prevent back pain or lessen existing pain.  If you have back pain, try to do these exercises 2-3 times each day or as told by your doctor.  As you get better, do the exercises once each day. Repeat the exercises more often as told by your doctor.  To stop back pain from coming back, do the exercises once each day, or as told by your doctor. Exercises Single knee to chest Do these steps 3-5 times in a row for  each leg: 7. Lie on your back on a firm bed or the floor with your legs stretched out. 8. Bring one knee to your chest. 9. Grab your knee or thigh with both hands and hold them it in place. 10. Pull on your knee until you feel a gentle stretch in your lower back or buttocks. 11. Keep doing the stretch for 10-30 seconds. 12. Slowly let go of your leg and straighten it. Pelvic tilt  Do these steps 5-10 times in a row: 5. Lie on your back on a firm bed or the floor with your legs stretched out. 6. Bend your knees so they point up to the ceiling. Your feet should be flat on the floor. 7. Tighten your lower belly (abdomen) muscles to press your lower back against the floor. This will make your tailbone point up to the ceiling instead of pointing down to your feet or the floor. 8. Stay in this position for 5-10 seconds while you gently tighten your muscles and breathe evenly. Cat-cow Do these steps until your lower back bends more easily: 6. Get on your hands and knees on a firm surface. Keep your hands under your shoulders, and keep your knees under your hips. You may put padding under your knees. 7. Let your head hang down toward your chest. Tighten (contract) the muscles in your belly. Point your tailbone toward the floor so your lower back becomes rounded like the back of a cat. 8. Stay in this position for 5 seconds. 9. Slowly lift your head. Let the muscles of your belly relax. Point your tailbone up toward the ceiling so your back forms a sagging arch like the back of a cow. 10. Stay in this position for 5 seconds.   Press-ups Do these steps 5-10 times in a row: 6. Lie on your belly (face-down) on the floor. 7. Place your hands near your head, about shoulder-width apart. 8. While you keep your back relaxed and keep your hips on the floor, slowly straighten your arms to raise the top half of your body and lift your shoulders. Do not use your back muscles. You may change where you place your  hands in order to make yourself more comfortable. 9. Stay in this position for 5 seconds. 10. Slowly return to lying flat on the floor.   Bridges Do these steps 10 times in a row: 6. Lie on your back on a firm surface. 7. Bend your knees so they point up to the ceiling. Your feet should be flat on the floor. Your arms should be flat at your sides, next to your body. 8. Tighten your butt muscles and lift your butt off the floor until your waist is almost as high as your knees. If you do not feel the muscles working in your butt and the back of your thighs, slide your feet 1-2 inches farther away from your butt. 9. Stay in this position for 3-5 seconds. 10. Slowly lower your butt to the floor, and let your butt muscles relax. If this exercise is too easy, try doing it with your arms crossed over your chest.   Belly crunches Do these steps 5-10 times in a row: 7. Lie on your back on a firm bed or the floor with your legs stretched out. 8. Bend your knees so they point up to the ceiling. Your feet should be flat on the floor. 9. Cross your arms over your chest. 10. Tip your chin a little bit toward your chest but do not bend your neck. 11. Tighten your belly muscles and slowly raise your chest just enough to lift your shoulder blades a tiny bit off of the floor. Avoid raising your body higher than that, because it can put too much stress on your low back. 12. Slowly lower your chest and your head to the floor. Back lifts Do these steps 5-10 times in a row: 6. Lie on your belly (face-down) with your arms  at your sides, and rest your forehead on the floor. 7. Tighten the muscles in your legs and your butt. 8. Slowly lift your chest off of the floor while you keep your hips on the floor. Keep the back of your head in line with the curve in your back. Look at the floor while you do this. 9. Stay in this position for 3-5 seconds. 10. Slowly lower your chest and your face to the floor. Contact a  doctor if:  Your back pain gets a lot worse when you do an exercise.  Your back pain does not get better 2 hours after you exercise. If you have any of these problems, stop doing the exercises. Do not do them again unless your doctor says it is okay. Get help right away if:  You have sudden, very bad back pain. If this happens, stop doing the exercises. Do not do them again unless your doctor says it is okay. This information is not intended to replace advice given to you by your health care provider. Make sure you discuss any questions you have with your health care provider. Document Revised: 06/12/2018 Document Reviewed: 06/12/2018 Elsevier Patient Education  2021 [employer].

## 2020-10-13 NOTE — Progress Notes (Signed)
Subjective:    Patient ID: Jasmine Rodriguez, female    DOB: 03-26-1956, 65 y.o.   MRN: [id number] right S1 selective nerve root block and transforaminal epidural.  CAUDAL EPIDURAL:  LEFT L4 NERVE ROOT BLOCK AND TRANSFORAMINAL EPIDURAL STEROID INJECTION  interlaminar approach was performed on the left at L5 (L5-S1 neural foramen).  LEFT L4-5 INTERLAMINAR EPIDUROGRAM   HPI  Chief complaint is low back pain as well as leg tingling pain 65 year old female with history of diabetes and approximately 20-year history of chronic low back pain.  She has had pain in the lower extremities bilaterally.  She has seen neurosurgery as well as orthopedics.  She has had lumbar epidural injections in 2004 2005 and 2008. The pain has increased more recently.  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 She remains independent with all self-care and mobility.  She does not utilize an assistive device.  Increasing Bilateral foot numbness and prickly sensation, no weakness in legs No pain with putting weight   The patient also has chronic numbness in the middle fingers of the right hand.  She has had 2 EMGs in 2020 with neurology both demonstrating mild right carpal tunnel syndrome as well as a mild C7 radiculopathy on the right side The patient has some neck pain but this is not severe.  She has never had any cervical spine surgery.  She has not had carpal tunnel surgery. She symptoms worries that she may drop objects.  The patient also complains of some urge incontinence.  She denies any numbness when she wipes after using the toilet.  She does not have any stress incontinence.  No change in urine.  No burning with urination or any fevers. Pain Inventory Average Pain 4 Pain Right Now 2 My pain is dull and aching  In the last 24 hours, has pain interfered with the following? General activity 0 Relation with others 0 Enjoyment of life 0 What TIME  of day is your pain at its worst? morning , daytime, evening and night Sleep (in general) Fair  Pain is worse with: walking, standing and some activites Pain improves with: heat/ice and medication Relief from Meds: 8  walk without assistance use a cane how many minutes can you walk? 15-20 ability to climb steps?  yes do you drive?  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  no  Any changes since last visit?  no    Family History  Problem Relation Age of Onset  . Hypertension Mother   . Arthritis Mother   . Congestive Heart Failure Mother   . Hypertension Father   . Head & neck cancer Father    Social History   Socioeconomic History  . Marital status: Married    Spouse name: Not on file  . Number of children: Not on file  . Years of education: Not on file  . Highest education level: Not on file  Occupational History  . Not on file  Tobacco Use  . Smoking status: Never Smoker  . Smokeless tobacco: Never Used  Vaping Use  . Vaping Use: Never used  Substance and Sexual Activity  . Alcohol use: No  . Drug use: No  . Sexual activity: Not on file  Other Topics Concern  . Not on file  Social History Narrative  .  Not on file   Social Determinants of Health   Financial Resource Strain: Not on file  Food Insecurity: Not on file  Transportation Needs: Not on file  Physical Activity: Not on file  Stress: Not on file  Social Connections: Not on file   Past Surgical History:  Procedure Laterality Date  . BREAST REDUCTION SURGERY    . CESAREAN SECTION    . KNEE SURGERY    . TUBAL LIGATION     Past Medical History:  Diagnosis Date  . DDD (degenerative disc disease)    BP 132/84   Pulse 81   Temp 97.8 F (36.6 C)   Ht 5\' 7"  (1.702 m)   Wt 217 lb 3.2 oz (98.5 kg)   SpO2 97%   BMI 34.02 kg/m   Opioid Risk Score:   Fall Risk Score:   `1  Depression screen PHQ 2/9  Depression screen [facility] 2/9 10/13/2020 10/15/2019 09/29/2019 06/11/2019 11/03/2018 10/16/2018  Decreased Interest 0 2 0 0 0 0  Down, Depressed, Hopeless 0 2 0 0 0 0  PHQ - 2 Score 0 4 0 0 0 0  Altered sleeping 0 0 - - - -  Tired, decreased energy 1 3 - - - -  Change in appetite 0 2 - - - -  Feeling bad or failure about yourself  0 0 - - - -  Trouble concentrating 0 0 - - - -  Moving slowly or fidgety/restless 0 0 - - - -  Suicidal thoughts 0 0 - - - -  PHQ-9 Score 1 9 - - - -  Difficult doing work/chores Not difficult at all Somewhat difficult - - - -   Review of Systems  Respiratory: Positive for shortness of breath and wheezing.   Gastrointestinal: Positive for constipation.  Musculoskeletal: Positive for back pain and neck pain.       Shoulder pain Fingers pain spasms  Neurological: Positive for weakness and numbness.       Tingling  All other systems reviewed and are negative.      Objective:   Physical Exam Vitals and nursing note reviewed.  Constitutional:      Appearance: She is obese.  HENT:     Head: Normocephalic and atraumatic.  Eyes:     Extraocular Movements: Extraocular movements intact.     Conjunctiva/sclera: Conjunctivae normal.     Pupils: Pupils are equal, round, and reactive to light.  Cardiovascular:     Rate and Rhythm: Normal rate and regular rhythm.     Pulses: Normal pulses.     Heart sounds: Normal heart sounds. No murmur heard.   Pulmonary:     Effort: Pulmonary effort is normal. No respiratory distress.     Breath sounds: Normal breath sounds. No wheezing.  Abdominal:     General: Abdomen is flat. Bowel sounds are normal. There is no distension.     Palpations: Abdomen is soft. There is no mass.     Tenderness: There is no abdominal tenderness.  Musculoskeletal:        General: Tenderness present. No deformity.     Cervical back: Normal range of motion. No rigidity or tenderness.     Right lower leg: No  edema.     Left lower leg: No edema.     Comments: Lumbar spine has limited range of motion 0 to 25% flexion, extension, lateral bending, and rotation.  Lumbar spine with tenderness bilateral L5 and S1 area worse on the right than  on the left side.  Skin:    General: Skin is warm and dry.  Neurological:     General: No focal deficit present.     Mental Status: She is alert and oriented to person, place, and time.     Cranial Nerves: No dysarthria or facial asymmetry.     Motor: No weakness or tremor.     Coordination: Coordination is intact.     Gait: Gait is intact.     Comments: Negative straight leg raising bilaterally Motor strength is 5/5 bilateral deltoid, bicep, tricep, grip, hip flexor, knee extensor, ankle dorsiflexor and plantar flexor  Psychiatric:        Mood and Affect: Mood normal.        Behavior: Behavior normal.        Thought Content: Thought content normal.        Judgment: Judgment normal.      Decreased pp Right C8 and Left C7 Neg reverse Phalens Neg tinels at wrist and elbow     Assessment & Plan:  1  Chronic low back pain with sciatic symptoms.  The patient had signs of disc degeneration as well as facet arthropathy on lumbar MRI approximately 16 years ago.  As discussed with the patient this has most certainly progressed.  The patient does not have any motor findings basically sensory paresthesias.  We also discussed that given good sensation and lack of other neurologic signs it is unlikely that her urinary symptoms are related to her back. We discussed her low back range of motion restrictions and have gone over exercises for this including knee-to-chest as well as hip lifts and cat cow exercise. We discussed that tramadol may help both her back pain as well as her nerve pathic pain.  We will have her take 50 mg every morning which is when her symptoms are most problematic.  2.  Chronic neck pain the patient has had EMG studies showing evidence of a mild  right C7 cervical radiculopathy.  Neuro exam does not show any focal neurologic findings.  She has not had any imaging studies, this is a secondary complaint and if this becomes more symptomatic would get some x-rays.    Return to clinic in 6 weeks.  At that time we can determine whether pregabalin may need to be started, whether the patient may need some formal physical therapy or if there is any change in her neuro exam.  Patient may need some lumbar imaging repeated especially if epidural injections are contemplated.

## 2020-10-21 LAB — HM DIABETES EYE EXAM

## 2020-10-31 ENCOUNTER — Telehealth: Payer: Self-pay

## 2020-10-31 ENCOUNTER — Ambulatory Visit: Payer: BC Managed Care – PPO | Admitting: Internal Medicine

## 2020-10-31 NOTE — Telephone Encounter (Signed)
I called the pt and rescheduled her appt for today because her provider is sick.

## 2020-11-01 ENCOUNTER — Other Ambulatory Visit: Payer: Self-pay | Admitting: Nurse Practitioner

## 2020-11-01 DIAGNOSIS — G8929 Other chronic pain: Secondary | ICD-10-CM

## 2020-11-01 DIAGNOSIS — M5442 Lumbago with sciatica, left side: Secondary | ICD-10-CM

## 2020-11-01 MED ORDER — TRAMADOL HCL 50 MG PO TABS: 50.0000 mg | ORAL_TABLET | Freq: Three times a day (TID) | ORAL | 0 refills | Status: DC | PRN

## 2020-11-15 ENCOUNTER — Other Ambulatory Visit: Payer: 59

## 2020-11-15 ENCOUNTER — Other Ambulatory Visit: Payer: Self-pay

## 2020-11-15 ENCOUNTER — Encounter: Payer: Self-pay | Admitting: Internal Medicine

## 2020-11-15 DIAGNOSIS — Z20822 Contact with and (suspected) exposure to covid-19: Secondary | ICD-10-CM

## 2020-11-16 DIAGNOSIS — L218 Other seborrheic dermatitis: Secondary | ICD-10-CM | POA: Diagnosis not present

## 2020-11-16 DIAGNOSIS — L650 Telogen effluvium: Secondary | ICD-10-CM | POA: Diagnosis not present

## 2020-11-16 DIAGNOSIS — L639 Alopecia areata, unspecified: Secondary | ICD-10-CM | POA: Diagnosis not present

## 2020-11-16 DIAGNOSIS — Z79899 Other long term (current) drug therapy: Secondary | ICD-10-CM | POA: Diagnosis not present

## 2020-11-16 DIAGNOSIS — L649 Androgenic alopecia, unspecified: Secondary | ICD-10-CM | POA: Diagnosis not present

## 2020-11-16 LAB — SARS-COV-2, NAA 2 DAY TAT

## 2020-11-16 LAB — NOVEL CORONAVIRUS, NAA: SARS-CoV-2, NAA: NOT DETECTED

## 2020-11-25 ENCOUNTER — Other Ambulatory Visit: Payer: Self-pay

## 2020-11-25 ENCOUNTER — Encounter: Payer: Self-pay | Admitting: Physical Medicine & Rehabilitation

## 2020-11-25 ENCOUNTER — Encounter: Payer: 59 | Attending: Physical Medicine & Rehabilitation | Admitting: Physical Medicine & Rehabilitation

## 2020-11-25 VITALS — BP 122/74 | HR 75 | Temp 98.1°F | Ht 67.0 in | Wt 218.2 lb

## 2020-11-25 DIAGNOSIS — M542 Cervicalgia: Secondary | ICD-10-CM | POA: Insufficient documentation

## 2020-11-25 DIAGNOSIS — M5442 Lumbago with sciatica, left side: Secondary | ICD-10-CM | POA: Diagnosis not present

## 2020-11-25 DIAGNOSIS — G8929 Other chronic pain: Secondary | ICD-10-CM | POA: Diagnosis not present

## 2020-11-25 DIAGNOSIS — L723 Sebaceous cyst: Secondary | ICD-10-CM | POA: Diagnosis not present

## 2020-11-25 DIAGNOSIS — M1712 Unilateral primary osteoarthritis, left knee: Secondary | ICD-10-CM | POA: Diagnosis not present

## 2020-11-25 MED ORDER — TRAMADOL HCL 50 MG PO TABS: 50.0000 mg | ORAL_TABLET | Freq: Three times a day (TID) | ORAL | 5 refills | Status: DC | PRN

## 2020-11-25 NOTE — Patient Instructions (Signed)
Use tramadol every day may take extra on work days   Continue home exercises  Practice straight leg raises 3 sets of 10 every other day

## 2020-11-25 NOTE — Progress Notes (Signed)
Subjective:    Patient ID: Jasmine Rodriguez, female    DOB: 24-Oct-1955, 65 y.o.   MRN: [id number]  HPI  65 year old female with chronic low back pain and left knee pain.  The patient does have evidence of lumbar facet degeneration as well as degenerative disc on MRI of the lumbar spine 16 years ago.  She has paresthesias but no motor deficits in the lower extremities.  She does have chronic left knee pain but does not wish to have any knee replacement surgery.  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  She tried some exercises was unable to do the 1 which required her to get on her hands and knees.  We discussed the other exercises. Uses Lidocaine patches at work 2 on back one on left knee Still with parasthesia Has extra pain on days that she works 2-3 days per week Pain Inventory Average Pain 6 Pain Right Now 6 My pain is intermittent, constant and dull  In the last 24 hours, has pain interfered with the following? General activity 5 Relation with others 0 Enjoyment of life 3 What TIME of day is your pain at its worst? varies Sleep (in general) Good  Pain is worse with: walking, bending, sitting, standing and some activites Pain improves with: heat/ice and medication Relief from Meds: 8  Family History  Problem Relation Age of Onset  . Hypertension Mother   . Arthritis Mother   . Congestive Heart Failure Mother   . Hypertension Father   . Head & neck cancer Father    Social History   Socioeconomic History  . Marital status: Married    Spouse name: Not on file  . Number of children: Not on file  . Years of education: Not on file  . Highest education level: Not on file  Occupational History  . Not on file  Tobacco Use  . Smoking status: Never Smoker  . Smokeless tobacco: Never Used  Vaping Use  . Vaping Use: Never used  Substance and Sexual Activity  .  Alcohol use: No  . Drug use: No  . Sexual activity: Not on file  Other Topics Concern  . Not on file  Social History Narrative  . Not on file   Social Determinants of Health   Financial Resource Strain: Not on file  Food Insecurity: Not on file  Transportation Needs: Not on file  Physical Activity: Not on file  Stress: Not on file  Social Connections: Not on file   Past Surgical History:  Procedure Laterality Date  . BREAST REDUCTION SURGERY    . CESAREAN SECTION    . KNEE SURGERY    . TUBAL LIGATION     Past Surgical History:  Procedure Laterality Date  . BREAST REDUCTION SURGERY    . CESAREAN SECTION    . KNEE SURGERY    . TUBAL LIGATION     Past Medical History:  Diagnosis Date  . DDD (degenerative disc disease)    BP 122/74   Pulse 75   Temp 98.1 F (36.7 C)   Ht 5\' 7"  (1.702 m)   Wt 218 lb 3.2 oz (99 kg)   SpO2 97%   BMI 34.17 kg/m   Opioid Risk Score:   Fall Risk Score:  `1  Depression screen [facility] 2/9  Depression screen [facility] 2/9 10/13/2020 10/15/2019 09/29/2019 06/11/2019  11/03/2018 10/16/2018  Decreased Interest 0 2 0 0 0 0  Down, Depressed, Hopeless 0 2 0 0 0 0  PHQ - 2 Score 0 4 0 0 0 0  Altered sleeping 0 0 - - - -  Tired, decreased energy 1 3 - - - -  Change in appetite 0 2 - - - -  Feeling bad or failure about yourself  0 0 - - - -  Trouble concentrating 0 0 - - - -  Moving slowly or fidgety/restless 0 0 - - - -  Suicidal thoughts 0 0 - - - -  PHQ-9 Score 1 9 - - - -  Difficult doing work/chores Not difficult at all Somewhat difficult - - - -     Review of Systems  Musculoskeletal: Positive for back pain.       Knee pain  All other systems reviewed and are negative.      Objective:   Physical Exam Vitals and nursing note reviewed.  Constitutional:      Appearance: She is obese.  HENT:     Head: Normocephalic and atraumatic.  Eyes:     Extraocular Movements: Extraocular movements intact.     Conjunctiva/sclera: Conjunctivae normal.      Pupils: Pupils are equal, round, and reactive to light.  Musculoskeletal:     Comments: Mild tenderness left knee along the medial joint line.  No tenderness along the patellar tendon or quadricep tendon or along the patella itself.  No evidence of knee effusion.  Skin:    General: Skin is warm and dry.  Neurological:     Mental Status: She is alert and oriented to person, place, and time.     Cranial Nerves: No dysarthria.     Coordination: Coordination is intact.     Gait: Gait is intact.     Comments: Motor strength is 5/5 bilateral hip flexor knee extensor ankle dorsiflexor  Psychiatric:        Mood and Affect: Mood normal.   Pain with extension of the lumbar spine greater than with flexion.  Limited extension to 25% of normal flexion is to 50% of normal        Assessment & Plan:  1  Chronic low back pain with sciatic symptoms.  The patient had signs of disc degeneration as well as facet arthropathy on lumbar MRI approximately 16 years ago.  As discussed with the patient this has most certainly progressed.  The patient does not have any motor findings basically sensory paresthesias.  We also discussed that given good sensation and lack of other neurologic signs it is unlikely that her urinary symptoms are related to her back. Will need UDS prior to chronic prescriptions for tramadol. We discussed that tramadol may help both her back pain as well as her neuropathic pain.  We will have her take 50 mg every morning which is when her symptoms are most problematic, on workdays she can take an extra tramadol therefore she will get 40 tablets/month with 5 refills.. She has had problems with doing her exercises due to multiple pain complaints.  Will ask physical therapy to work with her on outpatient basis twice a week 2.  Chronic neck pain the patient has had EMG studies showing evidence of a mild right C7 cervical radiculopathy.  Neuro exam does not show any focal neurologic findings.   She has not had any imaging studies, this is a secondary complaint and if this becomes more symptomatic would get some  x-rays.  We will have PT work with some exercises for cervical pain as well.  Return to clinic in 6 months.  Nurse practitioner.  No need for pregabalin at this time, no need for epidural steroid injection in the lumbar spine at this time.  She may benefit from medial branch blocks however she does not wish to pursue spinal injections at this time.  She would need x-rays of the lumbar spine for medial branch blocks but repeat MRI if we were contemplating epidural injections.

## 2020-12-01 ENCOUNTER — Other Ambulatory Visit: Payer: Self-pay

## 2020-12-01 ENCOUNTER — Encounter: Payer: Self-pay | Admitting: Internal Medicine

## 2020-12-01 ENCOUNTER — Ambulatory Visit: Payer: 59 | Admitting: Internal Medicine

## 2020-12-01 VITALS — BP 116/78 | HR 45 | Temp 98.3°F | Ht 67.0 in | Wt 212.8 lb

## 2020-12-01 DIAGNOSIS — Z6833 Body mass index (BMI) 33.0-33.9, adult: Secondary | ICD-10-CM | POA: Diagnosis not present

## 2020-12-01 DIAGNOSIS — E6609 Other obesity due to excess calories: Secondary | ICD-10-CM

## 2020-12-01 DIAGNOSIS — E1169 Type 2 diabetes mellitus with other specified complication: Secondary | ICD-10-CM

## 2020-12-01 DIAGNOSIS — E785 Hyperlipidemia, unspecified: Secondary | ICD-10-CM | POA: Diagnosis not present

## 2020-12-01 MED ORDER — NYSTATIN 100000 UNIT/GM EX POWD: CUTANEOUS | 1 refills | Status: DC

## 2020-12-01 NOTE — Progress Notes (Signed)
I,[name],acting as a Education administrator for [name], MD.,have documented all relevant documentation on the behalf of [name], MD,as directed by  [name], MD while in the presence of [name], MD.  This visit occurred during the SARS-CoV-2 public health emergency.  Safety protocols were in place, including screening questions prior to the visit, additional usage of staff PPE, and extensive cleaning of exam room while observing appropriate contact time as indicated for disinfecting solutions.  Subjective:     Patient ID: Jasmine Rodriguez , female    DOB: 10-12-1955 , 65 y.o.   MRN: [id number]   Chief Complaint  Patient presents with  . Diabetes    HPI  She presents today for diabetes check. She reports compliance with meds. She denies headaches, chest pain and shortness of breath. She is now working with [employer]. 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   Diabetes She presents for her follow-up diabetic visit. She has type 2 diabetes mellitus. There are no hypoglycemic associated symptoms. Pertinent negatives for diabetes include no blurred vision and no chest pain. There are no hypoglycemic complications. Risk factors for coronary artery disease include diabetes mellitus, obesity, dyslipidemia, sedentary lifestyle and post-menopausal. She is compliant with treatment most of the time. She is following a diabetic diet. She participates in exercise intermittently.     Past Medical History:  Diagnosis Date  . DDD (degenerative disc disease)      Family History  Problem Relation Age of Onset  . Hypertension Mother   . Arthritis Mother   . Congestive Heart Failure Mother   . Hypertension Father   . Head & neck cancer Father      Current Outpatient Medications:  .  Accu-Chek FastClix Lancets MISC, USE AS DIRECTED TO CHECK BLOOD SUGARS 2 TIMES PER DAY DX:E11.65, Disp: 102 each, Rfl: 0 .  ACCU-CHEK SMARTVIEW test strip, USE AS INSTRUCTED TO CHECK  BLOOD SUGARS DAILY DX: E11.22, Disp: 50 strip, Rfl: 2 .  acetaminophen (TYLENOL) 500 MG tablet, Take 500 mg by mouth every 6 (six) hours as needed., Disp: , Rfl:  .  albuterol (PROAIR HFA) 108 (90 Base) MCG/ACT inhaler, Inhale 2 puffs into the lungs every 6 (six) hours as needed for wheezing or shortness of breath., Disp: 18 g, Rfl: 3 .  Calcium Carbonate (CALTRATE 600 PO), Take by mouth. daily, Disp: , Rfl:  .  cholecalciferol (VITAMIN D) 1000 UNITS tablet, Take 2,000 Units by mouth daily., Disp: , Rfl:  .  cyclobenzaprine (FLEXERIL) 10 MG tablet, Take 1 tablet (10 mg total) by mouth at bedtime as needed for muscle spasms., Disp: 30 tablet, Rfl: 1 .  diclofenac (VOLTAREN) 75 MG EC tablet, Take 75 mg by mouth 2 (two) times daily., Disp: , Rfl:  .  fluocinonide (LIDEX) 0.05 % external solution, fluocinonide 0.05 % topical solution  APPLY 1 ML ON THE SKIN AS DIRECTED USE NIGHTLY, Disp: , Rfl:  .  glucosamine-chondroitin 500-400 MG tablet, Take 1 tablet by mouth 2 (two) times daily. , Disp: , Rfl:  .  lactase (LACTAID) 3000 UNITS tablet, Take 1 tablet by mouth 3 (three) times daily as needed. Dairy consumption, Disp: , Rfl:  .  lidocaine (LIDODERM) 5 %, PLACE 3 PATCHES ONTO SKIN DAILY AS NEEDED FOR PAIN,REMOVE & DISCARD PATCH WITHIN 12HRS/AS DIRECTED, Disp: 90 patch, Rfl: 2 .  pravastatin (PRAVACHOL) 40 MG tablet, TAKE 1 TABLET BY MOUTH EVERY DAY IN THE [place],  Disp: 90 tablet, Rfl: 1 .  sitaGLIPtin-metformin (JANUMET) 50-500 MG tablet, Take 1 tablet by mouth daily., Disp: 90 tablet, Rfl: 1 .  traMADol (ULTRAM) 50 MG tablet, Take 1 tablet (50 mg total) by mouth every 8 (eight) hours as needed., Disp: 40 tablet, Rfl: 5 .  valACYclovir (VALTREX) 500 MG tablet, TAKE 1 TABLET BY MOUTH EVERY DAY, Disp: 90 tablet, Rfl: 1 .  nystatin (MYCOSTATIN/NYSTOP) powder, Use as directed, Disp: 60 g, Rfl: 1   Allergies  Allergen Reactions  . Skelaxin [Metaxalone] Anaphylaxis, Itching and Rash  . Lisinopril       Review of Systems  Constitutional: Negative.   Eyes: Negative for blurred vision.  Respiratory: Negative.   Cardiovascular: Negative.  Negative for chest pain.  Gastrointestinal: Negative.   Psychiatric/Behavioral: Negative.   All other systems reviewed and are negative.    Today's Vitals   12/01/20 1028  BP: 116/78  Pulse: (!) 45  Temp: 98.3 F (36.8 C)  TempSrc: Oral  Weight: 212 lb 12.8 oz (96.5 kg)  Height: '5\' 7"'  (1.702 m)   Body mass index is 33.33 kg/m.  Wt Readings from Last 3 Encounters:  12/01/20 212 lb 12.8 oz (96.5 kg)  11/25/20 218 lb 3.2 oz (99 kg)  10/13/20 217 lb 3.2 oz (98.5 kg)   Objective:  Physical Exam Vitals and nursing note reviewed.  Constitutional:      Appearance: Normal appearance. She is obese.  HENT:     Head: Normocephalic and atraumatic.     Nose:     Comments: Masked     Mouth/Throat:     Comments: Masked  Cardiovascular:     Rate and Rhythm: Normal rate and regular rhythm.     Heart sounds: Normal heart sounds.  Pulmonary:     Effort: Pulmonary effort is normal.     Breath sounds: Normal breath sounds.  Musculoskeletal:     Cervical back: Normal range of motion.  Skin:    General: Skin is warm.  Neurological:     General: No focal deficit present.     Mental Status: She is alert and oriented to person, place, and time.         Assessment And Plan:     1. Type 2 diabetes mellitus with hyperlipidemia (HCC) Comments: Chronic, I will check labs as listed below.  Importance of regular exercise, avoidance of fried foods and increased fiber intake was d/w patient. Previous labs reviewed, LDL 82 March 2021. Advised that goal LDL is less than 70. She is encouraged to incorporate more exercise into her daily routine.  - BMP8+EGFR - Lipid panel - ALT  2. Class 1 obesity due to excess calories with serious comorbidity and body mass index (BMI) of 33.0 to 33.9 in adult  She is encouraged to strive for BMI less than 30 to decrease  cardiac risk. Advised to aim for at least 150 minutes of exercise per week.  Patient was given opportunity to ask questions. Patient verbalized understanding of the plan and was able to repeat key elements of the plan. All questions were answered to their satisfaction.   I, [name], MD, have reviewed all documentation for this visit. The documentation on 12/01/20 for the exam, diagnosis, procedures, and orders are all accurate and complete.  THE PATIENT IS ENCOURAGED TO PRACTICE SOCIAL DISTANCING DUE TO THE COVID-19 PANDEMIC.

## 2020-12-01 NOTE — Patient Instructions (Signed)
Diabetes Mellitus and Foot Care Foot care is an important part of your health, especially when you have diabetes. Diabetes may cause you to have problems because of poor blood flow (circulation) to your feet and legs, which can cause your skin to:  Become thinner and drier.  Break more easily.  Heal more slowly.  Peel and crack. You may also have nerve damage (neuropathy) in your legs and feet, causing decreased feeling in them. This means that you may not notice minor injuries to your feet that could lead to more serious problems. Noticing and addressing any potential problems early is the best way to prevent future foot problems. How to care for your feet Foot hygiene  Wash your feet daily with warm water and mild soap. Do not use hot water. Then, pat your feet and the areas between your toes until they are completely dry. Do not soak your feet as this can dry your skin.  Trim your toenails straight across. Do not dig under them or around the cuticle. File the edges of your nails with an emery board or nail file.  Apply a moisturizing lotion or petroleum jelly to the skin on your feet and to dry, brittle toenails. Use lotion that does not contain alcohol and is unscented. Do not apply lotion between your toes.   Shoes and socks  Wear clean socks or stockings every day. Make sure they are not too tight. Do not wear knee-high stockings since they may decrease blood flow to your legs.  Wear shoes that fit properly and have enough cushioning. Always look in your shoes before you put them on to be sure there are no objects inside.  To break in new shoes, wear them for just a few hours a day. This prevents injuries on your feet. Wounds, scrapes, corns, and calluses  Check your feet daily for blisters, cuts, bruises, sores, and redness. If you cannot see the bottom of your feet, use a mirror or ask someone for help.  Do not cut corns or calluses or try to remove them with medicine.  If you  find a minor scrape, cut, or break in the skin on your feet, keep it and the skin around it clean and dry. You may clean these areas with mild soap and water. Do not clean the area with peroxide, alcohol, or iodine.  If you have a wound, scrape, corn, or callus on your foot, look at it several times a day to make sure it is healing and not infected. Check for: ? Redness, swelling, or pain. ? Fluid or blood. ? Warmth. ? Pus or a bad smell.   General tips  Do not Mila Pair your legs. This may decrease blood flow to your feet.  Do not use heating pads or hot water bottles on your feet. They may burn your skin. If you have lost feeling in your feet or legs, you may not know this is happening until it is too late.  Protect your feet from hot and cold by wearing shoes, such as at the beach or on hot pavement.  Schedule a complete foot exam at least once a year (annually) or more often if you have foot problems. Report any cuts, sores, or bruises to your health care provider immediately. Where to find more information  American Diabetes Association: www.diabetes.org  Association of Diabetes Care & Education Specialists: www.diabeteseducator.org Contact a health care provider if:  You have a medical condition that increases your risk of infection and   you have any cuts, sores, or bruises on your feet.  You have an injury that is not healing.  You have redness on your legs or feet.  You feel burning or tingling in your legs or feet.  You have pain or cramps in your legs and feet.  Your legs or feet are numb.  Your feet always feel cold.  You have pain around any toenails. Get help right away if:  You have a wound, scrape, corn, or callus on your foot and: ? You have pain, swelling, or redness that gets worse. ? You have fluid or blood coming from the wound, scrape, corn, or callus. ? Your wound, scrape, corn, or callus feels warm to the touch. ? You have pus or a bad smell coming from  the wound, scrape, corn, or callus. ? You have a fever. ? You have a red line going up your leg. Summary  Check your feet every day for blisters, cuts, bruises, sores, and redness.  Apply a moisturizing lotion or petroleum jelly to the skin on your feet and to dry, brittle toenails.  Wear shoes that fit properly and have enough cushioning.  If you have foot problems, report any cuts, sores, or bruises to your health care provider immediately.  Schedule a complete foot exam at least once a year (annually) or more often if you have foot problems. This information is not intended to replace advice given to you by your health care provider. Make sure you discuss any questions you have with your health care provider. Document Revised: 04/07/2020 Document Reviewed: 04/07/2020 Elsevier Patient Education  2021 [employer]  

## 2020-12-02 LAB — TOXASSURE SELECT 13 (MW), URINE

## 2020-12-05 ENCOUNTER — Encounter: Payer: Self-pay | Admitting: Internal Medicine

## 2020-12-06 ENCOUNTER — Telehealth: Payer: Self-pay | Admitting: *Deleted

## 2020-12-06 NOTE — Telephone Encounter (Signed)
Urine drug screen for this encounter is consistent for prescribed medication 

## 2020-12-12 DIAGNOSIS — L659 Nonscarring hair loss, unspecified: Secondary | ICD-10-CM | POA: Diagnosis not present

## 2020-12-12 DIAGNOSIS — M5136 Other intervertebral disc degeneration, lumbar region: Secondary | ICD-10-CM | POA: Diagnosis not present

## 2020-12-12 DIAGNOSIS — R768 Other specified abnormal immunological findings in serum: Secondary | ICD-10-CM | POA: Diagnosis not present

## 2020-12-12 DIAGNOSIS — Z6834 Body mass index (BMI) 34.0-34.9, adult: Secondary | ICD-10-CM | POA: Diagnosis not present

## 2020-12-12 DIAGNOSIS — E669 Obesity, unspecified: Secondary | ICD-10-CM | POA: Diagnosis not present

## 2020-12-12 DIAGNOSIS — M2550 Pain in unspecified joint: Secondary | ICD-10-CM | POA: Diagnosis not present

## 2020-12-19 ENCOUNTER — Ambulatory Visit: Payer: 59 | Attending: Physical Medicine & Rehabilitation | Admitting: Physical Therapy

## 2020-12-19 ENCOUNTER — Encounter: Payer: Self-pay | Admitting: Physical Therapy

## 2020-12-19 ENCOUNTER — Other Ambulatory Visit: Payer: Self-pay

## 2020-12-19 DIAGNOSIS — R2689 Other abnormalities of gait and mobility: Secondary | ICD-10-CM | POA: Insufficient documentation

## 2020-12-19 DIAGNOSIS — M25562 Pain in left knee: Secondary | ICD-10-CM | POA: Diagnosis not present

## 2020-12-19 DIAGNOSIS — M545 Low back pain, unspecified: Secondary | ICD-10-CM | POA: Diagnosis not present

## 2020-12-19 DIAGNOSIS — G8929 Other chronic pain: Secondary | ICD-10-CM | POA: Diagnosis not present

## 2020-12-20 ENCOUNTER — Encounter: Payer: Self-pay | Admitting: Physical Therapy

## 2020-12-20 NOTE — Therapy (Signed)
[facility] [address] [place], [place], [address] Phone: [phone]   Fax:  [phone]  Physical Therapy Evaluation  Patient Details  Name: Jasmine Rodriguez MRN: [id number] Date of Birth: 09-20-1956 Referring Provider (PT): Dr Jeanene Erb   Encounter Date: 12/19/2020   PT End of Session - 12/19/20 1438    Visit Number 1    Number of Visits 12    Date for PT Re-Evaluation 01/30/21    Authorization Type [facility]    PT Start Time 1423   Patient 7 minutes late   PT Stop Time 1500    PT Time Calculation (min) 37 min    Activity Tolerance Patient tolerated treatment well    Behavior During Therapy [facility] for tasks assessed/performed           Past Medical History:  Diagnosis Date  . DDD (degenerative disc disease)     Past Surgical History:  Procedure Laterality Date  . BREAST REDUCTION SURGERY    . CESAREAN SECTION    . KNEE SURGERY    . TUBAL LIGATION      There were no vitals filed for this visit.    Subjective Assessment - 12/19/20 1429    Subjective Patient has a long histroy of lower back pain. She can remember lifting a patient a long time ago and she felt something but the pain wasn't as bad back then. Her knee has also been hurting for years. She has degenerative disc disease in her neck as well. She has numbness in her feet from degenerative disc disease.    How long can you walk comfortably? Dpends, some days she cango longer then others    Currently in Pain? Yes    Pain Score 3     Pain Location Back    Pain Orientation Left    Pain Descriptors / Indicators Aching    Pain Radiating Towards pain can radiate down her leg    Pain Onset More than a month ago    Pain Frequency Constant    Aggravating Factors  Standing, walking,    Pain Relieving Factors lidocane patches; heat, tramadol    Multiple Pain Sites No   just a tugging in the neck   Pain Score 3    Pain Location Knee    Pain Orientation  Right    Pain Descriptors / Indicators Aching    Pain Type Chronic pain    Pain Onset More than a month ago    Pain Frequency Constant    Aggravating Factors  standing and walking    Pain Relieving Factors rest, ice, or heat,              [facility] PT Assessment - 12/20/20 0001      Assessment   Medical Diagnosis Low Back Pain/ Left Knee Pain    Referring Provider (PT) Dr Jeanene Erb    Onset Date/Surgical Date --   Alll problems have been going on for sveral years   Hand Dominance Right    Next MD Visit Some time in April    Prior Therapy can not remmber      Precautions   Precautions None      Restrictions   Weight Bearing Restrictions No      Balance Screen   Has the patient fallen in the past 6 months No    Has the patient had a decrease in activity level because of a fear of falling?  No  Is the patient reluctant to leave their home because of a fear of falling?  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    Leisure likes to work outside and walk outside      [employer]   Overall Cognitive Status Within Functional Limits for tasks assessed    Attention Focused    Focused Attention Appears intact    Memory Appears intact    Awareness Appears intact    Problem Solving Appears intact    Executive Function Reasoning      Observation/Other Assessments   Focus on Therapeutic Outcomes (FOTO)  45% ability 55% expected      Sensation   Light Touch Appears Intact      Coordination   Gross Motor Movements are Fluid and Coordinated Yes    Fine Motor Movements are Fluid and Coordinated Yes      AROM   Overall AROM Comments full active ROM wof the left knee without pain    Cervical Flexion WNL    Cervical - Right  Rotation 58    Cervical - Left Rotation 60      PROM   Overall PROM Comments full PROM of the hips with minor resitance with end range flexion      Strength   Strength Assessment Site Hip;Knee;Shoulder    Right/Left Shoulder Right;Left    Right Shoulder Flexion 4+/5    Right Shoulder Internal Rotation 5/5    Right Shoulder External Rotation 4+/5    Left Shoulder Extension 4+/5    Left Shoulder Internal Rotation 5/5    Left Shoulder External Rotation 4+/5    Right/Left Hip Left;Right    Right Hip Flexion 4/5    Right Hip External Rotation  5/5    Right Hip Internal Rotation 5/5    Left Hip Flexion 4/5    Left Hip ABduction 5/5    Left Hip ADduction 5/5      Palpation   Palpation comment spasming bilateral paraspinals R>L spasming and tightness in bilateral upper trpas. No tenderness to palpation of the knee      Ambulation/Gait   Gait Comments decreased bilatersal hip flexion; decreased left single leg stance                      Objective measurements completed on examination: See above findings.       [facility] Adult PT Treatment/Exercise - 12/20/20 0001      Exercises   Exercises Lumbar      Lumbar Exercises: Stretches   Other Lumbar Stretch Exercise tennis ball release to gluteals, lumbar spine and upper back      Lumbar Exercises: Standing   Other Standing Lumbar Exercises for genral postrue scpa retraction and extension yellow 2x10;                  PT Education - 12/20/20 0757    Education Details reviewed progressive exercises and self manual therapy to manage pain.    Person(s) Educated Patient    Methods Explanation;Demonstration;Verbal cues;Tactile cues    Comprehension Verbalized understanding;Returned demonstration;Verbal cues required;Tactile cues required            PT Short Term Goals -  12/20/20 0927      PT SHORT TERM GOAL #1   Title Patient will increase gorss bilateral strength to 5/5    Time 3    Period Weeks     Status New    Target Date 01/10/21      PT SHORT TERM GOAL #2   Title Patient will be independent with a basic HEP for stretching and strengthening.    Time 3    Period Weeks    Status New    Target Date 01/10/21      PT SHORT TERM GOAL #3   Title Therapy will review FOTO woth patient    Time 3    Period Weeks    Status New    Target Date 01/10/21      PT SHORT TERM GOAL #4   Title Patient will increae bilateral cerivcal rotation by 10 degrees    Time 3    Period Weeks    Status New    Target Date 01/10/21             PT Long Term Goals - 12/20/20 1137      PT LONG TERM GOAL #1   Title Patient will work outside in the garden without a significant increase in back, neck, and knee pain    Time 6    Period Weeks    Status New    Target Date 01/31/21      PT LONG TERM GOAL #2   Title Patient will be indepdnent with complete program to manage her pain and also continue to work on strenghtening    Time 6    Period Weeks    Status New    Target Date 01/31/21                  Plan - 12/19/20 1516    Clinical Impression Statement Patient is a 65 year old female with a long history of back, knee, and cervical pain. Her pain si mostly brought on by standing, walking, and activity. She presents with mild limitations in cervical rotation and lumbar side bending. She has spasming on in the left lumbar paraspinals and left gluteals. She has full range and strnegth of her left knee but it is painful when she stands and walks for too long. She would benefit from skilled therapy.    Personal Factors and Comorbidities Profession;Comorbidity 1    Comorbidities DDD    Examination-Activity Limitations Squat;Bed Mobility;Stand;Lift;Locomotion Level;Transfers    Examination-Participation Restrictions Meal Prep;Cleaning;Community Activity;Laundry;Yard Work    Conservation officer, historic buildings Evolving/Moderate complexity   progressive increase in pain overlong period of time    Clinical Decision Making Moderate    Rehab Potential Good    PT Frequency 2x / week    PT Duration 6 weeks    PT Treatment/Interventions ADLs/Self Care Home Management;Electrical Stimulation;Cryotherapy;Iontophoresis 4mg /ml Dexamethasone;Gait training;DME Instruction;Functional mobility training;Therapeutic activities;Therapeutic exercise;Neuromuscular re-education;Patient/family education;Manual techniques;Passive range of motion;Dry needling;Taping;Moist Heat;Ultrasound;Aquatic Therapy    PT Next Visit Plan review tolerance to exercises. Review low back stretching; consider hamstring stretching; consider manual therapy if needed but keep exercise /stretching based if able. begin base knee program quad sets, clam shells heel raise; SLR if back tolerates    PT Home Exercise Plan tennis ball trigger point release; scp retration yellow; shoulder extension yellow    Consulted and Agree with Plan of Care Patient           Patient will benefit from skilled therapeutic intervention in order to  improve the following deficits and impairments:  Abnormal gait,Decreased range of motion,Increased fascial restricitons,Impaired UE functional use,Increased muscle spasms,Decreased endurance,Pain,Decreased strength,Decreased mobility,Postural dysfunction  Visit Diagnosis: Chronic left-sided low back pain without sciatica  Chronic pain of left knee  Other abnormalities of gait and mobility     Problem List Patient Active Problem List   Diagnosis Date Noted  . Carpal tunnel syndrome, bilateral 12/30/2018  . Cervical radiculopathy 12/30/2018  . Prediabetes 10/16/2018  . Chronic left-sided low back pain with left-sided sciatica 10/16/2018    Dessie Comaavid J Kyheem Bathgate PT DPT  12/20/2020, 11:42 AM  [facility] [address] [place], [place], [id number] Phone: [phone]   Fax:  [phone]  Name: Jasmine Rodriguez MRN: [id number] Date of Birth: Jul 09, 1956

## 2020-12-24 ENCOUNTER — Other Ambulatory Visit: Payer: Self-pay | Admitting: Internal Medicine

## 2020-12-28 ENCOUNTER — Encounter: Payer: 59 | Admitting: Physical Therapy

## 2021-01-04 ENCOUNTER — Ambulatory Visit: Payer: 59 | Attending: Physical Medicine & Rehabilitation | Admitting: Physical Therapy

## 2021-01-04 ENCOUNTER — Encounter: Payer: Self-pay | Admitting: Physical Therapy

## 2021-01-04 ENCOUNTER — Other Ambulatory Visit: Payer: Self-pay

## 2021-01-04 DIAGNOSIS — R2689 Other abnormalities of gait and mobility: Secondary | ICD-10-CM | POA: Insufficient documentation

## 2021-01-04 DIAGNOSIS — M25562 Pain in left knee: Secondary | ICD-10-CM | POA: Insufficient documentation

## 2021-01-04 DIAGNOSIS — G8929 Other chronic pain: Secondary | ICD-10-CM | POA: Insufficient documentation

## 2021-01-04 DIAGNOSIS — M545 Low back pain, unspecified: Secondary | ICD-10-CM | POA: Insufficient documentation

## 2021-01-04 NOTE — Patient Instructions (Signed)
Access Code: [email or web address]: [email or web address]: 04/06/2022Prepared by: Alric Seton  Standing Glute Med Mobilization with Small Ball on Wall - 1 x daily - 7 x weekly - 3 sets - 10 reps  Seated Upper Trapezius Stretch - 1 x daily - 7 x weekly - 3 sets - 10 reps  Gentle Levator Scapulae Stretch - 1 x daily - 7 x weekly - 3 sets - 10 reps  Supine Piriformis Stretch with Foot on Ground - 1 x daily - 7 x weekly - 3 sets - 10 reps  Seated Hamstring Stretch - 1 x daily - 7 x weekly - 3 sets - 10 reps  Shoulder extension with resistance - Neutral - 1 x daily - 7 x weekly - 3 sets - 10 reps  Scapular Retraction with Resistance - 1 x daily - 7 x weekly - 3 sets - 10 reps  Seated Hip Abduction with Resistance - 1 x daily - 7 x weekly - 3 sets - 10 reps

## 2021-01-04 NOTE — Therapy (Signed)
[facility] [address] [place], [place], [address] Phone: [phone]   Fax:  [phone]  Physical Therapy Treatment  Patient Details  Name: Jasmine Rodriguez MRN: [id number] Date of Birth: 07-25-1956 Referring Provider (PT): Dr Jeanene Erb   Encounter Date: 01/04/2021   PT End of Session - 01/04/21 1551    Visit Number 2    Number of Visits 12    Date for PT Re-Evaluation 01/30/21    Authorization Type [facility]    PT Start Time 1145    PT Stop Time 1228    PT Time Calculation (min) 43 min    Activity Tolerance Patient tolerated treatment well    Behavior During Therapy [facility] for tasks assessed/performed           Past Medical History:  Diagnosis Date  . DDD (degenerative disc disease)     Past Surgical History:  Procedure Laterality Date  . BREAST REDUCTION SURGERY    . CESAREAN SECTION    . KNEE SURGERY    . TUBAL LIGATION      There were no vitals filed for this visit.   Subjective Assessment - 01/04/21 1153    Subjective Patient is having soreness in her right anterior shoulder today and her left lowe back. She has done some needling    How long can you walk comfortably? Dpends, some days she cango longer then others    Currently in Pain? Yes    Pain Score 6    levels not formally assessed but faces pain scale at a 6   Pain Location Back    Pain Orientation Left    Pain Descriptors / Indicators Aching    Pain Type Chronic pain    Pain Onset More than a month ago    Aggravating Factors  gardening    Pain Relieving Factors lidocane patches    Effect of Pain on Daily Activities difficulty perfroming daily activity    Pain Score 6   using faces pain scale   Pain Location Neck    Pain Orientation Right    Pain Descriptors / Indicators Aching    Pain Type Chronic pain    Pain Onset More than a month ago    Pain Frequency Constant    Aggravating Factors  using her right arm; turning her head    Pain  Relieving Factors difficulty using her right arm                             [facility] Adult PT Treatment/Exercise - 01/04/21 0001      Self-Care   Self-Care Other Self-Care Comments    Other Self-Care Comments  reviewed use of the theras-cane      Lumbar Exercises: Stretches   Passive Hamstring Stretch Limitations seated 3x20 sec hold bilateral    Lower Trunk Rotation Limitations x20    Piriformis Stretch Limitations 3x20 sec hold      Lumbar Exercises: Seated   Other Seated Lumbar Exercises clam shell 2x10; pillow squeeze 2x10      Manual Therapy   Manual Therapy Soft tissue mobilization;Manual Traction    Soft tissue mobilization to upper trap and cervical spine    Manual Traction gentle to cervical spine                  PT Education - 01/04/21 1337    Education Details patient given updated HEP  Person(s) Educated Patient    Methods Explanation;Demonstration;Tactile cues;Verbal cues    Comprehension Verbalized understanding;Returned demonstration;Tactile cues required;Verbal cues required            PT Short Term Goals - 12/20/20 0927      PT SHORT TERM GOAL #1   Title Patient will increase gorss bilateral strength to 5/5    Time 3    Period Weeks    Status New    Target Date 01/10/21      PT SHORT TERM GOAL #2   Title Patient will be independent with a basic HEP for stretching and strengthening.    Time 3    Period Weeks    Status New    Target Date 01/10/21      PT SHORT TERM GOAL #3   Title Therapy will review FOTO woth patient    Time 3    Period Weeks    Status New    Target Date 01/10/21      PT SHORT TERM GOAL #4   Title Patient will increae bilateral cerivcal rotation by 10 degrees    Time 3    Period Weeks    Status New    Target Date 01/10/21             PT Long Term Goals - 12/20/20 1137      PT LONG TERM GOAL #1   Title Patient will work outside in the garden without a significant increase in back, neck,  and knee pain    Time 6    Period Weeks    Status New    Target Date 01/31/21      PT LONG TERM GOAL #2   Title Patient will be indepdnent with complete program to manage her pain and also continue to work on strenghtening    Time 6    Period Weeks    Status New    Target Date 01/31/21                 Plan - 01/04/21 1339    Clinical Impression Statement Therapy updated HEP today. She was given stretches for her upper trap and levator. She had spasming on the right today. Therapy perfromed manual therapy to her neck and shoulder but her positional tolerance was limited. We attmepted manual therapy to her left hip but she could not lie on er right side. She was given a clamshell exercises at home. She trieda pillow suqeeze with abdominal reathing ut it caused pain on the right side. She will be seen in 2 weeks We will progress as tolerated. She would benefit from left knee strengthening exercises as well next visit.    Personal Factors and Comorbidities Profession;Comorbidity 1    Comorbidities DDD    Examination-Activity Limitations Squat;Bed Mobility;Stand;Lift;Locomotion Level;Transfers    Examination-Participation Restrictions Meal Prep;Cleaning;Community Activity;Laundry;Yard Work    Stability/Clinical Decision Making Evolving/Moderate complexity    Clinical Decision Making Moderate    Rehab Potential Good    PT Frequency 2x / week    PT Duration 6 weeks    PT Treatment/Interventions ADLs/Self Care Home Management;Electrical Stimulation;Cryotherapy;Iontophoresis 4mg /ml Dexamethasone;Gait training;DME Instruction;Functional mobility training;Therapeutic activities;Therapeutic exercise;Neuromuscular re-education;Patient/family education;Manual techniques;Passive range of motion;Dry needling;Taping;Moist Heat;Ultrasound;Aquatic Therapy    PT Next Visit Plan consider standing core stability exercises. Manual therapy if patient able to tolerate the position    PT Home Exercise Plan  tennis ball trigger point release; scp retration yellow; shoulder extension yellow    Consulted and Agree with  Plan of Care Patient           Patient will benefit from skilled therapeutic intervention in order to improve the following deficits and impairments:  Abnormal gait,Decreased range of motion,Increased fascial restricitons,Impaired UE functional use,Increased muscle spasms,Decreased endurance,Pain,Decreased strength,Decreased mobility,Postural dysfunction  Visit Diagnosis: Chronic left-sided low back pain without sciatica  Chronic pain of left knee  Other abnormalities of gait and mobility     Problem List Patient Active Problem List   Diagnosis Date Noted  . Carpal tunnel syndrome, bilateral 12/30/2018  . Cervical radiculopathy 12/30/2018  . Prediabetes 10/16/2018  . Chronic left-sided low back pain with left-sided sciatica 10/16/2018    Dessie Coma PT DPT  01/04/2021, 3:57 PM  [facility] [address] [place], [place], [address] Phone: [phone]   Fax:  [phone]  Name: Jasmine Rodriguez MRN: [id number] Date of Birth: 1956/03/22

## 2021-01-11 ENCOUNTER — Other Ambulatory Visit: Payer: Self-pay

## 2021-01-11 ENCOUNTER — Encounter: Payer: 59 | Admitting: Physical Therapy

## 2021-01-11 ENCOUNTER — Other Ambulatory Visit (HOSPITAL_COMMUNITY): Payer: Self-pay

## 2021-01-11 ENCOUNTER — Encounter: Payer: Self-pay | Admitting: Internal Medicine

## 2021-01-11 MED ORDER — JANUMET 50-500 MG PO TABS
1.0000 | ORAL_TABLET | Freq: Every day | ORAL | 1 refills | Status: DC
  Filled 2021-01-11: qty 90, 90d supply, fill #0

## 2021-01-18 ENCOUNTER — Other Ambulatory Visit: Payer: Self-pay

## 2021-01-18 ENCOUNTER — Ambulatory Visit: Payer: 59 | Admitting: Physical Therapy

## 2021-01-18 ENCOUNTER — Encounter: Payer: Self-pay | Admitting: Physical Therapy

## 2021-01-18 DIAGNOSIS — M25562 Pain in left knee: Secondary | ICD-10-CM

## 2021-01-18 DIAGNOSIS — R2689 Other abnormalities of gait and mobility: Secondary | ICD-10-CM

## 2021-01-18 DIAGNOSIS — M545 Low back pain, unspecified: Secondary | ICD-10-CM

## 2021-01-18 DIAGNOSIS — G8929 Other chronic pain: Secondary | ICD-10-CM

## 2021-01-19 ENCOUNTER — Encounter: Payer: Self-pay | Admitting: Physical Therapy

## 2021-01-19 NOTE — Therapy (Signed)
[place], [place], [address] Phone: [phone]   Fax:  [phone]  Physical Therapy Treatment/Discharge   Patient Details  Name: Jasmine Rodriguez MRN: [id number] Date of Birth: 01-Dec-1955 Referring Provider (PT): Dr Jone Baseman   Encounter Date: 01/18/2021   PT End of Session - 01/18/21 1653    Visit Number 3    Number of Visits 12    Date for PT Re-Evaluation 01/30/21    Authorization Type [facility]    PT Start Time 7564    PT Stop Time 1226    PT Time Calculation (min) 41 min    Activity Tolerance Patient tolerated treatment well    Behavior During Therapy [facility] for tasks assessed/performed            Past Medical History:  Diagnosis Date  . DDD (degenerative disc disease)     Past Surgical History:  Procedure Laterality Date  . BREAST REDUCTION SURGERY    . CESAREAN SECTION    . KNEE SURGERY    . TUBAL LIGATION      There were no vitals filed for this visit.   Subjective Assessment - 01/18/21 1153    Subjective Patoient has had some buring in her lower back. She continues to use her patches. She has been working on her sttretches and exercises.    How long can you walk comfortably? Dpends, some days she cango longer then others    Currently in Pain? Yes    Pain Score 4     Pain Location Back    Pain Orientation Left    Pain Descriptors / Indicators Aching;Burning    Pain Type Chronic pain    Pain Onset More than a month ago    Pain Frequency Constant    Aggravating Factors  gardening    Pain Relieving Factors lidocane    Effect of Pain on Daily Activities difficulty perfromind ADL's    Multiple Pain Sites No                             [facility] Adult PT Treatment/Exercise - 01/19/21 0001      Self-Care   Other Self-Care Comments  reviewed how to use her HEP at home. Reviewed how to progress exercises.      Neck Exercises: Supine   Other Supine Exercise  reviewed supine march 2x10;      Lumbar Exercises: Stretches   Passive Hamstring Stretch Limitations seated 3x20 sec hold bilateral    Lower Trunk Rotation Limitations x20    Piriformis Stretch Limitations 3x20 sec hold    Other Lumbar Stretch Exercise tennis ball release to gluteals, lumbar spine and upper back      Lumbar Exercises: Aerobic   Nustep 5  min UE /LE explained the conspet of low load activity over a period of time                  PT Education - 01/18/21 1651    Education Details reviewed final HEP. Given standing exercises but advised patient to be patient.    Person(s) Educated Patient    Methods Explanation;Demonstration;Tactile cues;Verbal cues    Comprehension Verbalized understanding;Returned demonstration;Verbal cues required;Tactile cues required            PT Short Term Goals - 01/19/21 0940      PT SHORT TERM GOAL #1   Title Patient will increase gorss bilateral strength  to 5/5    Baseline Patient is not yet to 5/5    Time 3    Period Weeks    Status On-going      PT SHORT TERM GOAL #2   Title Patient will be independent with a basic HEP for stretching and strengthening.    Baseline perfroming HEP    Time 3    Period Weeks    Status Achieved    Target Date 01/10/21      PT SHORT TERM GOAL #3   Title Therapy will review FOTO woth patient    Baseline reviewed final FOTO    Time 3    Period Weeks    Status Achieved      PT SHORT TERM GOAL #4   Title Patient will increae bilateral cerivcal rotation by 10 degrees    Baseline improved cerviucal rotation    Time 3    Period Weeks    Status Achieved             PT Long Term Goals - 12/20/20 1137      PT LONG TERM GOAL #1   Title Patient will work outside in the garden without a significant increase in back, neck, and knee pain    Time 6    Period Weeks    Status New    Target Date 01/31/21      PT LONG TERM GOAL #2   Title Patient will be indepdnent with complete program to  manage her pain and also continue to work on [place]    Time [place]    Target Date 01/31/21                 Plan - 01/18/21 1653    Clinical Impression Statement The patient feels like she can take her program and continue to work on it one her own. her program has been more of a self guided program. She has come in for 2 visits and we have updated her HEP. In that month with self guided HEP her FOTO score has improved 8%. She continues to report pain but she reports she is able to complete exercises and stretches at home. She was advised that if she continueswe can expected the progress to speed up as she is able to walk more. her knee will be limiting as well but over a long enough period of time she has the potential to have a significant improvement in pain.    Personal Factors and Comorbidities Profession;Comorbidity 1    Examination-Activity Limitations Squat;Bed Mobility;Stand;Lift;Locomotion Level;Transfers    Examination-Participation Restrictions Meal Prep;Cleaning;Community Activity;Laundry;Yard Work    Stability/Clinical Decision Making Evolving/Moderate complexity    Clinical Decision Making Moderate    Rehab Potential Good    PT Frequency 2x / week    PT Duration 6 weeks    PT Treatment/Interventions ADLs/Self Care Home Management;Electrical Stimulation;Cryotherapy;Iontophoresis 16m/ml Dexamethasone;Gait training;DME Instruction;Functional mobility training;Therapeutic activities;Therapeutic exercise;Neuromuscular re-education;Patient/family education;Manual techniques;Passive range of motion;Dry needling;Taping;Moist Heat;Ultrasound;Aquatic Therapy    PT Next Visit Plan consider standing core stability exercises. 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  Patient goals were partially met. Patient is being discharged due to being pleased with the current functional level.  ?????       Problem List Patient Active Problem List   Diagnosis Date Noted  . Carpal tunnel syndrome, bilateral 12/30/2018  . Cervical radiculopathy 12/30/2018  . Prediabetes 10/16/2018  . Chronic left-sided low back pain with left-sided sciatica 10/16/2018    Carney Living PT DPT  01/19/2021, 9:43 AM  [place], [place], [address] Phone: [phone]   Fax:  [phone]  Name: Jasmine Rodriguez MRN: [id number] Date of Birth: 1956/06/19

## 2021-02-15 ENCOUNTER — Other Ambulatory Visit: Payer: Self-pay | Admitting: Internal Medicine

## 2021-02-17 DIAGNOSIS — Z0271 Encounter for disability determination: Secondary | ICD-10-CM

## 2021-02-21 ENCOUNTER — Encounter: Payer: Self-pay | Admitting: Internal Medicine

## 2021-02-21 ENCOUNTER — Other Ambulatory Visit: Payer: Self-pay | Admitting: Internal Medicine

## 2021-02-21 DIAGNOSIS — G8929 Other chronic pain: Secondary | ICD-10-CM

## 2021-02-21 MED ORDER — TRAMADOL HCL 50 MG PO TABS: 50.0000 mg | ORAL_TABLET | Freq: Three times a day (TID) | ORAL | 5 refills | Status: DC | PRN

## 2021-03-29 ENCOUNTER — Other Ambulatory Visit: Payer: Self-pay

## 2021-03-29 ENCOUNTER — Encounter: Payer: Self-pay | Admitting: Internal Medicine

## 2021-03-29 ENCOUNTER — Telehealth: Payer: Self-pay

## 2021-03-29 MED ORDER — SITAGLIPTIN PHOSPHATE 100 MG PO TABS: 100.0000 mg | ORAL_TABLET | Freq: Every day | ORAL | 1 refills | Status: DC

## 2021-03-29 NOTE — Telephone Encounter (Signed)
Patient switched to [place] due to cost of janumet. Patient will follow up with provider in September.

## 2021-04-06 ENCOUNTER — Ambulatory Visit: Payer: 59 | Admitting: Internal Medicine

## 2021-04-06 ENCOUNTER — Other Ambulatory Visit: Payer: Self-pay

## 2021-04-21 ENCOUNTER — Encounter: Payer: Self-pay | Admitting: Internal Medicine

## 2021-04-24 ENCOUNTER — Other Ambulatory Visit: Payer: Self-pay

## 2021-04-24 MED ORDER — PRAVASTATIN SODIUM 40 MG PO TABS: ORAL_TABLET | ORAL | 0 refills | Status: DC

## 2021-04-25 ENCOUNTER — Other Ambulatory Visit: Payer: Self-pay

## 2021-04-25 MED ORDER — JANUMET 50-500 MG PO TABS: 1.0000 | ORAL_TABLET | Freq: Every day | ORAL | 2 refills | Status: DC

## 2021-05-03 ENCOUNTER — Ambulatory Visit: Payer: Self-pay | Admitting: Internal Medicine

## 2021-05-25 ENCOUNTER — Other Ambulatory Visit: Payer: Self-pay

## 2021-05-25 ENCOUNTER — Encounter: Payer: Self-pay | Admitting: Registered Nurse

## 2021-05-25 ENCOUNTER — Encounter: Payer: 59 | Attending: Registered Nurse | Admitting: Registered Nurse

## 2021-05-25 VITALS — BP 136/81 | HR 75 | Temp 98.0°F | Ht 67.0 in | Wt 218.0 lb

## 2021-05-25 DIAGNOSIS — G894 Chronic pain syndrome: Secondary | ICD-10-CM | POA: Diagnosis present

## 2021-05-25 DIAGNOSIS — M778 Other enthesopathies, not elsewhere classified: Secondary | ICD-10-CM | POA: Insufficient documentation

## 2021-05-25 DIAGNOSIS — M542 Cervicalgia: Secondary | ICD-10-CM | POA: Diagnosis not present

## 2021-05-25 DIAGNOSIS — M7581 Other shoulder lesions, right shoulder: Secondary | ICD-10-CM

## 2021-05-25 DIAGNOSIS — M1712 Unilateral primary osteoarthritis, left knee: Secondary | ICD-10-CM

## 2021-05-25 DIAGNOSIS — G5691 Unspecified mononeuropathy of right upper limb: Secondary | ICD-10-CM | POA: Diagnosis not present

## 2021-05-25 NOTE — Progress Notes (Signed)
Subjective:    Patient ID: Jasmine Rodriguez, female    DOB: 1956-03-30, 66 y.o.   MRN: [id number]  HPI: Jasmine Rodriguez is a 65 y.o. female who returns for follow up appointment for chronic pain and medication refill. She states her pain is located in  her neck, right shoulder, right hand finger tips with numbness and left knee pain. She rates her pain 4. Her current exercise regime is walking and performing stretching exercises.  Ms. Traister Morphine equivalent is 15.38 MME, Tramadol being prescribed by Dr Allyne Gee.  Last UDS was Performed on 11/25/2020, it was consistent.      Pain Inventory Average Pain 6 Pain Right Now 4 My pain is intermittent, burning, tingling, and aching  In the last 24 hours, has pain interfered with the following? General activity 2 Relation with others 2 Enjoyment of life 4 What TIME of day is your pain at its worst? daytime and night Sleep (in general)  fair to good  Pain is worse with: walking, sitting, inactivity, and standing Pain improves with: rest and medication Relief from Meds: 6  Family History  Problem Relation Age of Onset   Hypertension Mother    Arthritis Mother    Congestive Heart Failure Mother    Hypertension Father    Head & neck cancer Father    Social History   Socioeconomic History   Marital status: Divorced    Spouse name: Not on file   Number of children: Not on file   Years of education: Not on file   Highest education level: Not on file  Occupational History   Not on file  Tobacco Use   Smoking status: Never   Smokeless tobacco: Never  Vaping Use   Vaping Use: Never used  Substance and Sexual Activity   Alcohol use: No   Drug use: No   Sexual activity: Not on file  Other Topics Concern   Not on file  Social History Narrative   Not on file   Social Determinants of Health   Financial Resource Strain: Not on file  Food Insecurity: Not on file  Transportation Needs: Not on file  Physical Activity: Not on  file  Stress: Not on file  Social Connections: Not on file   Past Surgical History:  Procedure Laterality Date   BREAST REDUCTION SURGERY     CESAREAN SECTION     KNEE SURGERY     TUBAL LIGATION     Past Surgical History:  Procedure Laterality Date   BREAST REDUCTION SURGERY     CESAREAN SECTION     KNEE SURGERY     TUBAL LIGATION     Past Medical History:  Diagnosis Date   DDD (degenerative disc disease)    BP 136/81   Pulse 75   Temp 98 F (36.7 C)   Ht 5\' 7"  (1.702 m)   Wt 218 lb (98.9 kg)   BMI 34.14 kg/m   Opioid Risk Score:   Fall Risk Score:  `1  Depression screen PHQ 2/9  Depression screen [facility] 2/9 10/13/2020 10/15/2019 09/29/2019 06/11/2019 11/03/2018 10/16/2018  Decreased Interest 0 2 0 0 0 0  Down, Depressed, Hopeless 0 2 0 0 0 0  PHQ - 2 Score 0 4 0 0 0 0  Altered sleeping 0 0 - - - -  Tired, decreased energy 1 3 - - - -  Change in appetite 0 2 - - - -  Feeling bad or failure about yourself  0 0 - - - -  Trouble concentrating 0 0 - - - -  Moving slowly or fidgety/restless 0 0 - - - -  Suicidal thoughts 0 0 - - - -  PHQ-9 Score 1 9 - - - -  Difficult doing work/chores Not difficult at all Somewhat difficult - - - -    Review of Systems  Musculoskeletal:  Positive for back pain and neck pain.       Left knee pain  All other systems reviewed and are negative.     Objective:   Physical Exam Vitals and nursing note reviewed.  Constitutional:      Appearance: Normal appearance.  Neck:     Comments: Cervical Paraspinal Tenderness: C-5-C-6 Cardiovascular:     Rate and Rhythm: Normal rate and regular rhythm.     Pulses: Normal pulses.     Heart sounds: Normal heart sounds.  Pulmonary:     Effort: Pulmonary effort is normal.     Breath sounds: Normal breath sounds.  Musculoskeletal:     Cervical back: Normal range of motion and neck supple.     Comments: Normal Muscle Bulk and Muscle Testing Reveals:  Upper Extremities: Full ROM and Muscle Strength  5/5 Left AC Joint Tenderness  Lumbar Paraspinal Tenderness: L-4-L-5 Mainly Left Side Lower Extremities: Full ROM and Muscle Strength 5/5 Crepitus with Lower Extremity Flexion Arises from Table with Ease Narrow Based  Gait         Skin:    General: Skin is warm and dry.  Neurological:     Mental Status: She is alert and oriented to person, place, and time.  Psychiatric:        Mood and Affect: Mood normal.        Behavior: Behavior normal.         Assessment & Plan:  Cervicalgia: Continue HEP as Tolerated. Continue to Monitor.  2. Right Shoulder Tendonitis: Continue to Alternate Ice and Heat Therapy. Continue to Monitor.  3. Neuropathic Pain: Right Hand: Continue current medication Regimen. Continue to monitor.  4. Left Knee Pain: RX: X-ray. Continue to Monitor.  5. Chronic Pain Syndrome: Continue Tramadol. PCP Prescribing. Continue to Monitor.   F/U in 6 months

## 2021-05-31 ENCOUNTER — Other Ambulatory Visit: Payer: Self-pay

## 2021-05-31 ENCOUNTER — Encounter: Payer: Self-pay | Admitting: Internal Medicine

## 2021-05-31 ENCOUNTER — Ambulatory Visit (INDEPENDENT_AMBULATORY_CARE_PROVIDER_SITE_OTHER): Payer: 59 | Admitting: Internal Medicine

## 2021-05-31 VITALS — BP 122/70 | HR 66 | Temp 98.5°F | Ht 67.0 in | Wt 216.4 lb

## 2021-05-31 DIAGNOSIS — E6609 Other obesity due to excess calories: Secondary | ICD-10-CM

## 2021-05-31 DIAGNOSIS — E785 Hyperlipidemia, unspecified: Secondary | ICD-10-CM

## 2021-05-31 DIAGNOSIS — Z23 Encounter for immunization: Secondary | ICD-10-CM

## 2021-05-31 DIAGNOSIS — E1169 Type 2 diabetes mellitus with other specified complication: Secondary | ICD-10-CM

## 2021-05-31 DIAGNOSIS — F4322 Adjustment disorder with anxiety: Secondary | ICD-10-CM

## 2021-05-31 DIAGNOSIS — R42 Dizziness and giddiness: Secondary | ICD-10-CM

## 2021-05-31 DIAGNOSIS — Z6833 Body mass index (BMI) 33.0-33.9, adult: Secondary | ICD-10-CM

## 2021-05-31 DIAGNOSIS — Z636 Dependent relative needing care at home: Secondary | ICD-10-CM

## 2021-05-31 LAB — POCT URINALYSIS DIPSTICK
Bilirubin, UA: NEGATIVE
Glucose, UA: NEGATIVE
Ketones, UA: NEGATIVE
Leukocytes, UA: NEGATIVE
Nitrite, UA: NEGATIVE
Protein, UA: NEGATIVE
Spec Grav, UA: 1.025 (ref 1.010–1.025)
Urobilinogen, UA: 0.2 E.U./dL
pH, UA: 7 (ref 5.0–8.0)

## 2021-05-31 MED ORDER — JANUMET 50-500 MG PO TABS: ORAL_TABLET | ORAL | 2 refills | Status: DC

## 2021-05-31 MED ORDER — PRAVASTATIN SODIUM 40 MG PO TABS: ORAL_TABLET | ORAL | 2 refills | Status: DC

## 2021-05-31 NOTE — Progress Notes (Signed)
I,Yamilka Roman [employer] as a Education administrator for [name], MD.,have documented all relevant documentation on the behalf of [name], MD,as directed by  [name], MD while in the presence of [name], MD.  This visit occurred during the SARS-CoV-2 public health emergency.  Safety protocols were in place, including screening questions prior to the visit, additional usage of staff PPE, and extensive cleaning of exam room while observing appropriate contact time as indicated for disinfecting solutions.  Subjective:     Patient ID: Jasmine Rodriguez , female    DOB: 12-30-1955 , 65 y.o.   MRN: [id number]   Chief Complaint  Patient presents with   Diabetes    HPI  She presents today for diabetes check. She reports compliance with meds. She states her sugars are well controlled.   Diabetes She presents for her follow-up diabetic visit. She has type 2 diabetes mellitus. Pertinent negatives for diabetes include no blurred vision and no chest pain. There are no hypoglycemic complications. Risk factors for coronary artery disease include diabetes mellitus, obesity, dyslipidemia, sedentary lifestyle and post-menopausal. She is compliant with treatment most of the time. She is following a diabetic diet. She participates in exercise intermittently.    Past Medical History:  Diagnosis Date   DDD (degenerative disc disease)      Family History  Problem Relation Age of Onset   Hypertension Mother    Arthritis Mother    Congestive Heart Failure Mother    Hypertension Father    Head & neck cancer Father      Current Outpatient Medications:    Accu-Chek FastClix Lancets MISC, USE AS DIRECTED TO CHECK BLOOD SUGARS 2 TIMES PER DAY DX:E11.65, Disp: 102 each, Rfl: 0   ACCU-CHEK SMARTVIEW test strip, USE AS INSTRUCTED TO CHECK BLOOD SUGARS DAILY DX: E11.22, Disp: 50 strip, Rfl: 2   acetaminophen (TYLENOL) 500 MG tablet, Take 500 mg by mouth every 6 (six) hours as needed., Disp: ,  Rfl:    albuterol (PROAIR HFA) 108 (90 Base) MCG/ACT inhaler, Inhale 2 puffs into the lungs every 6 (six) hours as needed for wheezing or shortness of breath., Disp: 18 g, Rfl: 3   Calcium Carbonate (CALTRATE 600 PO), Take by mouth. daily, Disp: , Rfl:    cholecalciferol (VITAMIN D) 1000 UNITS tablet, Take 2,000 Units by mouth daily., Disp: , Rfl:    diclofenac (VOLTAREN) 75 MG EC tablet, Take 75 mg by mouth 2 (two) times daily., Disp: , Rfl:    fluocinonide (LIDEX) 0.05 % external solution, fluocinonide 0.05 % topical solution  APPLY 1 ML ON THE SKIN AS DIRECTED USE NIGHTLY, Disp: , Rfl:    glucosamine-chondroitin 500-400 MG tablet, Take 1 tablet by mouth 2 (two) times daily. , Disp: , Rfl:    lactase (LACTAID) 3000 UNITS tablet, Take 1 tablet by mouth 3 (three) times daily as needed. Dairy consumption, Disp: , Rfl:    lidocaine (LIDODERM) 5 %, PLACE 3 PATCHES ONTO SKIN DAILY AS NEEDED FOR PAIN,REMOVE & DISCARD PATCH WITHIN 12HRS/AS DIRECTED, Disp: 90 patch, Rfl: 2   nystatin (MYCOSTATIN/NYSTOP) powder, Use as directed, Disp: 60 g, Rfl: 1   traMADol (ULTRAM) 50 MG tablet, Take 1 tablet (50 mg total) by mouth every 8 (eight) hours as needed., Disp: 40 tablet, Rfl: 5   valACYclovir (VALTREX) 500 MG tablet, TAKE 1 TABLET BY MOUTH EVERY DAY, Disp: 90 tablet, Rfl: 1   cyclobenzaprine (FLEXERIL) 10 MG tablet, Take 1 tablet (10 mg total)  by mouth at bedtime as needed for muscle spasms. (Patient not taking: Reported on 05/31/2021), Disp: 30 tablet, Rfl: 1   pravastatin (PRAVACHOL) 40 MG tablet, TAKE 1 TABLET BY MOUTH EVERY DAY IN THE EVENING, Disp: 90 tablet, Rfl: 2   sitaGLIPtin-metformin (JANUMET) 50-500 MG tablet, One tab po bid, Disp: 180 tablet, Rfl: 2   Allergies  Allergen Reactions   Skelaxin [Metaxalone] Anaphylaxis, Itching and Rash   Lisinopril      Review of Systems  Constitutional: Negative.   HENT:         AFTER pt had left exam room, she mentioned to CMA that she has episodes of  vertigo. Since this was after the visit, pt advised of OTC remedies. Declined ENT eval initally.   Eyes:  Negative for blurred vision.  Respiratory: Negative.    Cardiovascular: Negative.  Negative for chest pain.  Gastrointestinal: Negative.   Neurological: Negative.   Psychiatric/Behavioral:         At end of visit she mentions being under a lot of stress. She is caring for her mother, along with her siblings. At times, she feels overwhelmed. Has been having anxiety attacks. These consist of chest discomfort, palpitations, uneasiness. Resolves after several minutes. Occur at rest. Never w/ exertion.     Today's Vitals   05/31/21 0934  BP: 122/70  Pulse: 66  Temp: 98.5 F (36.9 C)  Weight: 216 lb 6.4 oz (98.2 kg)  Height: 5' 7" (1.702 m)   Body mass index is 33.89 kg/m.  Wt Readings from Last 3 Encounters:  05/31/21 216 lb 6.4 oz (98.2 kg)  05/25/21 218 lb (98.9 kg)  12/01/20 212 lb 12.8 oz (96.5 kg)     Objective:  Physical Exam Vitals and nursing note reviewed.  Constitutional:      Appearance: Normal appearance.  HENT:     Head: Normocephalic and atraumatic.     Nose:     Comments: Masked     Mouth/Throat:     Comments: Masked  Eyes:     Extraocular Movements: Extraocular movements intact.  Cardiovascular:     Rate and Rhythm: Normal rate and regular rhythm.     Heart sounds: Normal heart sounds.  Pulmonary:     Effort: Pulmonary effort is normal.     Breath sounds: Normal breath sounds.  Musculoskeletal:     Cervical back: Normal range of motion.  Skin:    General: Skin is warm.  Neurological:     General: No focal deficit present.     Mental Status: She is alert.  Psychiatric:        Mood and Affect: Mood normal.        Behavior: Behavior normal.        Assessment And Plan:     1. Type 2 diabetes mellitus with hyperlipidemia (HCC) Comments: Chronic, I will check labs as listed below. Encouraged to follow dietary recommendations.  I will adjust meds  as needed.  - Hemoglobin A1c - POCT Urinalysis Dipstick (81002) - Microalbumin / Creatinine Urine Ratio - CMP14+EGFR - CBC - Lipid panel  2. Caregiver stress Comments: She is encouraged to look into online support group.   3. Adjustment reaction with anxiety Comments: Sx suggestive of panic attacks. Encouraged to notify me if her sx persist. Advised to take magnesium nightly. May also benefit from L-theanine, OTC.   4. Vertigo Comments: I will refer her to ENT for further evaluation.  - Ambulatory referral to ENT  5. Class 1  obesity due to excess calories with serious comorbidity and body mass index (BMI) of 33.0 to 33.9 in adult Comments: She is encouraged to strive for BMi less than 30 to decrease cardiac risk. Encouraged to aim for at least 150 minutes of exercise per week.   6. Immunization due Comments: She declines Shingrix vaccine. Strongly encouraged to consider Pneumovax.    Patient was given opportunity to ask questions. Patient verbalized understanding of the plan and was able to repeat key elements of the plan. All questions were answered to their satisfaction.   I, [name], MD, have reviewed all documentation for this visit. The documentation on 05/31/21 for the exam, diagnosis, procedures, and orders are all accurate and complete.   IF YOU HAVE BEEN REFERRED TO A SPECIALIST, IT MAY TAKE 1-2 WEEKS TO SCHEDULE/PROCESS THE REFERRAL. IF YOU HAVE NOT HEARD FROM US/SPECIALIST IN TWO WEEKS, PLEASE GIVE [place] A CALL AT [phone].   THE PATIENT IS ENCOURAGED TO PRACTICE SOCIAL DISTANCING DUE TO THE COVID-19 PANDEMIC.

## 2021-05-31 NOTE — Patient Instructions (Addendum)
Magnesium glycinate, use as directed  Diabetes Mellitus and Nutrition, Adult When you have diabetes, or diabetes mellitus, it is very important to have healthy eating habits because your blood sugar (glucose) levels are greatly affected by what you eat and drink. Eating healthy foods in the right amounts, at about the same times every day, can help you: Control your blood glucose. Lower your risk of heart disease. Improve your blood pressure. Reach or maintain a healthy weight. What can affect my meal plan? Every person with diabetes is different, and each person has different needs for a meal plan. Your health care provider may recommend that you work with a dietitian to make a meal plan that is best for you. Your meal plan may vary depending on factors such as: The calories you need. The medicines you take. Your weight. Your blood glucose, blood pressure, and cholesterol levels. Your activity level. Other health conditions you have, such as heart or kidney disease. How do carbohydrates affect me? Carbohydrates, also called carbs, affect your blood glucose level more than any other type of food. Eating carbs naturally raises the amount of glucose in your blood. Carb counting is a method for keeping track of how many carbs you eat. Counting carbs is important to keep your blood glucose at a healthy level, especially if you use insulin or take certain oral diabetes medicines. It is important to know how many carbs you can safely have in each meal. This is different for every person. Your dietitian can help you calculate how many carbs you should have at each meal and for each snack. How does alcohol affect me? Alcohol can cause a sudden decrease in blood glucose (hypoglycemia), especially if you use insulin or take certain oral diabetes medicines. Hypoglycemia can be a life-threatening condition. Symptoms of hypoglycemia, such as sleepiness, dizziness, and confusion, are similar to symptoms of  having too much alcohol. Do not drink alcohol if: Your health care provider tells you not to drink. You are pregnant, may be pregnant, or are planning to become pregnant. If you drink alcohol: Do not drink on an empty stomach. Limit how much you use to: 0-1 drink a day for women. 0-2 drinks a day for men. Be aware of how much alcohol is in your drink. In the [place] one drink equals one 12 oz bottle of beer (355 mL), one 5 oz glass of wine (148 mL), or one 1 oz glass of hard liquor (44 mL). Keep yourself hydrated with water, diet soda, or unsweetened iced tea. Keep in mind that regular soda, juice, and other mixers may contain a lot of sugar and must be counted as carbs. What are tips for following this plan? Reading food labels Start by checking the serving size on the "Nutrition Facts" label of packaged foods and drinks. The amount of calories, carbs, fats, and other nutrients listed on the label is based on one serving of the item. Many items contain more than one serving per package. Check the total grams (g) of carbs in one serving. You can calculate the number of servings of carbs in one serving by dividing the total carbs by 15. For example, if a food has 30 g of total carbs per serving, it would be equal to 2 servings of carbs. Check the number of grams (g) of saturated fats and trans fats in one serving. Choose foods that have a low amount or none of these fats. Check the number of milligrams (mg) of salt (sodium) in  one serving. Most people should limit total sodium intake to less than 2,300 mg per day. Always check the nutrition information of foods labeled as "low-fat" or "nonfat." These foods may be higher in added sugar or refined carbs and should be avoided. Talk to your dietitian to identify your daily goals for nutrients listed on the label. Shopping Avoid buying canned, pre-made, or processed foods. These foods tend to be high in fat, sodium, and added sugar. Shop around the  outside edge of the grocery store. This is where you will most often find fresh fruits and vegetables, bulk grains, fresh meats, and fresh dairy. Cooking Use low-heat cooking methods, such as baking, instead of high-heat cooking methods like deep frying. Cook using healthy oils, such as olive, canola, or sunflower oil. Avoid cooking with butter, cream, or high-fat meats. Meal planning Eat meals and snacks regularly, preferably at the same times every day. Avoid going long periods of time without eating. Eat foods that are high in fiber, such as fresh fruits, vegetables, beans, and whole grains. Talk with your dietitian about how many servings of carbs you can eat at each meal. Eat 4-6 oz (112-168 g) of lean protein each day, such as lean meat, chicken, fish, eggs, or tofu. One ounce (oz) of lean protein is equal to: 1 oz (28 g) of meat, chicken, or fish. 1 egg.  cup (62 g) of tofu. Eat some foods each day that contain healthy fats, such as avocado, nuts, seeds, and fish. What foods should I eat? Fruits Berries. Apples. Oranges. Peaches. Apricots. Plums. Grapes. Mango. Papaya. Pomegranate. Kiwi. Cherries. Vegetables Lettuce. Spinach. Leafy greens, including kale, chard, collard greens, and mustard greens. Beets. Cauliflower. Cabbage. Broccoli. Carrots. Green beans. Tomatoes. Peppers. Onions. Cucumbers. Brussels sprouts. Grains Whole grains, such as whole-wheat or whole-grain bread, crackers, tortillas, cereal, and pasta. Unsweetened oatmeal. Quinoa. Brown or wild rice. Meats and other proteins Seafood. Poultry without skin. Lean cuts of poultry and beef. Tofu. Nuts. Seeds. Dairy Low-fat or fat-free dairy products such as milk, yogurt, and cheese. The items listed above may not be a complete list of foods and beverages you can eat. Contact a dietitian for more information. What foods should I avoid? Fruits Fruits canned with syrup. Vegetables Canned vegetables. Frozen vegetables with  butter or cream sauce. Grains Refined white flour and flour products such as bread, pasta, snack foods, and cereals. Avoid all processed foods. Meats and other proteins Fatty cuts of meat. Poultry with skin. Breaded or fried meats. Processed meat. Avoid saturated fats. Dairy Full-fat yogurt, cheese, or milk. Beverages Sweetened drinks, such as soda or iced tea. The items listed above may not be a complete list of foods and beverages you should avoid. Contact a dietitian for more information. Questions to ask a health care provider Do I need to meet with a diabetes educator? Do I need to meet with a dietitian? What number can I call if I have questions? When are the best times to check my blood glucose? Where to find more information: American Diabetes Association: diabetes.org Academy of Nutrition and Dietetics: www.eatright.[employer] of Diabetes and Digestive and Kidney Diseases: CarFlippers.tn Association of Diabetes Care and Education Specialists: www.diabeteseducator.org Summary It is important to have healthy eating habits because your blood sugar (glucose) levels are greatly affected by what you eat and drink. A healthy meal plan will help you control your blood glucose and maintain a healthy lifestyle. Your health care provider may recommend that you work with a dietitian  to make a meal plan that is best for you. Keep in mind that carbohydrates (carbs) and alcohol have immediate effects on your blood glucose levels. It is important to count carbs and to use alcohol carefully. This information is not intended to replace advice given to you by your health care provider. Make sure you discuss any questions you have with your health care provider. Document Revised: 08/25/2019 Document Reviewed: 08/25/2019 Elsevier Patient Education  2021 [employer].

## 2021-06-01 ENCOUNTER — Other Ambulatory Visit: Payer: Self-pay

## 2021-06-01 ENCOUNTER — Encounter: Payer: Self-pay | Admitting: Internal Medicine

## 2021-06-01 DIAGNOSIS — R42 Dizziness and giddiness: Secondary | ICD-10-CM

## 2021-06-01 LAB — LIPID PANEL
Chol/HDL Ratio: 2.8 ratio (ref 0.0–4.4)
Cholesterol, Total: 189 mg/dL (ref 100–199)
HDL: 67 mg/dL (ref >39)
LDL Chol Calc (NIH): 110 mg/dL — ABNORMAL HIGH (ref 0–99)
Triglycerides: 65 mg/dL (ref 0–149)
VLDL Cholesterol Cal: 12 mg/dL (ref 5–40)

## 2021-06-01 LAB — CMP14+EGFR
ALT: 18 IU/L (ref 0–32)
AST: 19 IU/L (ref 0–40)
Albumin/Globulin Ratio: 1.6 (ref 1.2–2.2)
Albumin: 4.6 g/dL (ref 3.8–4.8)
Alkaline Phosphatase: 91 IU/L (ref 44–121)
BUN/Creatinine Ratio: 13 (ref 12–28)
BUN: 12 mg/dL (ref 8–27)
Bilirubin Total: 0.4 mg/dL (ref 0.0–1.2)
CO2: 24 mmol/L (ref 20–29)
Calcium: 9.6 mg/dL (ref 8.7–10.3)
Chloride: 104 mmol/L (ref 96–106)
Creatinine, Ser: 0.91 mg/dL (ref 0.57–1.00)
Globulin, Total: 2.8 g/dL (ref 1.5–4.5)
Glucose: 108 mg/dL — ABNORMAL HIGH (ref 65–99)
Potassium: 4.7 mmol/L (ref 3.5–5.2)
Sodium: 142 mmol/L (ref 134–144)
Total Protein: 7.4 g/dL (ref 6.0–8.5)
eGFR: 71 mL/min/{1.73_m2} (ref >59)

## 2021-06-01 LAB — CBC
Hematocrit: 38.1 % (ref 34.0–46.6)
Hemoglobin: 12.4 g/dL (ref 11.1–15.9)
MCH: 27.7 pg (ref 26.6–33.0)
MCHC: 32.5 g/dL (ref 31.5–35.7)
MCV: 85 fL (ref 79–97)
Platelets: 247 10*3/uL (ref 150–450)
RBC: 4.48 x10E6/uL (ref 3.77–5.28)
RDW: 13.6 % (ref 11.7–15.4)
WBC: 7.6 10*3/uL (ref 3.4–10.8)

## 2021-06-01 LAB — HEMOGLOBIN A1C
Est. average glucose Bld gHb Est-mCnc: 137 mg/dL
Hgb A1c MFr Bld: 6.4 % — ABNORMAL HIGH (ref 4.8–5.6)

## 2021-06-01 LAB — MICROALBUMIN / CREATININE URINE RATIO
Creatinine, Urine: 136.4 mg/dL
Microalb/Creat Ratio: 8 mg/g creat (ref 0–29)
Microalbumin, Urine: 10.6 ug/mL

## 2021-06-02 ENCOUNTER — Encounter: Payer: Self-pay | Admitting: Internal Medicine

## 2021-06-26 ENCOUNTER — Encounter: Payer: Self-pay | Admitting: Internal Medicine

## 2021-06-27 ENCOUNTER — Encounter: Payer: BC Managed Care – PPO | Admitting: Internal Medicine

## 2021-06-27 NOTE — Progress Notes (Deleted)
Jeri Cos Llittleton,acting as a Neurosurgeon for [name], MD.,have documented all relevant documentation on the behalf of [name], MD,as directed by  [name], MD while in the presence of [name], MD.  This visit occurred during the SARS-CoV-2 public health emergency.  Safety protocols were in place, including screening questions prior to the visit, additional usage of staff PPE, and extensive cleaning of exam room while observing appropriate contact time as indicated for disinfecting solutions.  Subjective:     Patient ID: Jasmine Rodriguez , female    DOB: Sep 22, 1956 , 65 y.o.   MRN: [id number]   Chief Complaint  Patient presents with   Annual Exam    HPI  Patient here for hm    Past Medical History:  Diagnosis Date   DDD (degenerative disc disease)      Family History  Problem Relation Age of Onset   Hypertension Mother    Arthritis Mother    Congestive Heart Failure Mother    Hypertension Father    Head & neck cancer Father      Current Outpatient Medications:    Accu-Chek FastClix Lancets MISC, USE AS DIRECTED TO CHECK BLOOD SUGARS 2 TIMES PER DAY DX:E11.65, Disp: 102 each, Rfl: 0   ACCU-CHEK SMARTVIEW test strip, USE AS INSTRUCTED TO CHECK BLOOD SUGARS DAILY DX: E11.22, Disp: 50 strip, Rfl: 2   acetaminophen (TYLENOL) 500 MG tablet, Take 500 mg by mouth every 6 (six) hours as needed., Disp: , Rfl:    albuterol (PROAIR HFA) 108 (90 Base) MCG/ACT inhaler, Inhale 2 puffs into the lungs every 6 (six) hours as needed for wheezing or shortness of breath., Disp: 18 g, Rfl: 3   Calcium Carbonate (CALTRATE 600 PO), Take by mouth. daily, Disp: , Rfl:    cholecalciferol (VITAMIN D) 1000 UNITS tablet, Take 2,000 Units by mouth daily., Disp: , Rfl:    cyclobenzaprine (FLEXERIL) 10 MG tablet, Take 1 tablet (10 mg total) by mouth at bedtime as needed for muscle spasms. (Patient not taking: Reported on 05/31/2021), Disp: 30 tablet, Rfl: 1   diclofenac  (VOLTAREN) 75 MG EC tablet, Take 75 mg by mouth 2 (two) times daily., Disp: , Rfl:    fluocinonide (LIDEX) 0.05 % external solution, fluocinonide 0.05 % topical solution  APPLY 1 ML ON THE SKIN AS DIRECTED USE NIGHTLY, Disp: , Rfl:    glucosamine-chondroitin 500-400 MG tablet, Take 1 tablet by mouth 2 (two) times daily. , Disp: , Rfl:    lactase (LACTAID) 3000 UNITS tablet, Take 1 tablet by mouth 3 (three) times daily as needed. Dairy consumption, Disp: , Rfl:    lidocaine (LIDODERM) 5 %, PLACE 3 PATCHES ONTO SKIN DAILY AS NEEDED FOR PAIN,REMOVE & DISCARD PATCH WITHIN 12HRS/AS DIRECTED, Disp: 90 patch, Rfl: 2   nystatin (MYCOSTATIN/NYSTOP) powder, Use as directed, Disp: 60 g, Rfl: 1   pravastatin (PRAVACHOL) 40 MG tablet, TAKE 1 TABLET BY MOUTH EVERY DAY IN THE EVENING, Disp: 90 tablet, Rfl: 2   sitaGLIPtin-metformin (JANUMET) 50-500 MG tablet, One tab po bid, Disp: 180 tablet, Rfl: 2   traMADol (ULTRAM) 50 MG tablet, Take 1 tablet (50 mg total) by mouth every 8 (eight) hours as needed., Disp: 40 tablet, Rfl: 5   valACYclovir (VALTREX) 500 MG tablet, TAKE 1 TABLET BY MOUTH EVERY DAY, Disp: 90 tablet, Rfl: 1   Allergies  Allergen Reactions   Skelaxin [Metaxalone] Anaphylaxis, Itching and Rash   Lisinopril  The patient states she uses {contraceptive methods:5051} for birth control. Last LMP was No LMP recorded. Patient is postmenopausal.. {Dysmenorrhea-menorrhagia:21918}. Negative for: breast discharge, breast lump(s), breast pain and breast self exam. Associated symptoms include abnormal vaginal bleeding. Pertinent negatives include abnormal bleeding (hematology), anxiety, decreased libido, depression, difficulty falling sleep, dyspareunia, history of infertility, nocturia, sexual dysfunction, sleep disturbances, urinary incontinence, urinary urgency, vaginal discharge and vaginal itching. Diet regular.The patient states her exercise level is    . The patient's tobacco use is:  Social History    Tobacco Use  Smoking Status Never  Smokeless Tobacco Never  . She has been exposed to passive smoke. The patient's alcohol use is:  Social History   Substance and Sexual Activity  Alcohol Use No  . Additional information: Last pap ***, next one scheduled for ***.    Review of Systems  Constitutional: Negative.   HENT: Negative.    Eyes: Negative.   Respiratory: Negative.    Cardiovascular: Negative.   Gastrointestinal: Negative.   Endocrine: Negative.   Genitourinary: Negative.   Musculoskeletal: Negative.   Skin: Negative.   Allergic/Immunologic: Negative.   Neurological: Negative.   Hematological: Negative.   Psychiatric/Behavioral: Negative.      There were no vitals filed for this visit. There is no height or weight on file to calculate BMI.   Objective:  Physical Exam      Assessment And Plan:     1. Encounter for general adult medical examination w/o abnormal findings  2. Type 2 diabetes mellitus with hyperlipidemia (HCC)  3. Vitamin D deficiency  4. Encounter for screening for human immunodeficiency virus (HIV)  5. Immunization due     Patient was given opportunity to ask questions. Patient verbalized understanding of the plan and was able to repeat key elements of the plan. All questions were answered to their satisfaction.   Patrecia Pour Llittleton, CMA   I, Patrecia Pour Llittleton, CMA, have reviewed all documentation for this visit. The documentation on 06/27/21 for the exam, diagnosis, procedures, and orders are all accurate and complete.  THE PATIENT IS ENCOURAGED TO PRACTICE SOCIAL DISTANCING DUE TO THE COVID-19 PANDEMIC.

## 2021-07-09 NOTE — Progress Notes (Signed)
Erroneous, due to no show/cancellation

## 2021-08-08 LAB — HM PAP SMEAR

## 2021-08-08 LAB — HM MAMMOGRAPHY: HM Mammogram: NORMAL (ref 0–4)

## 2021-08-08 LAB — COLOGUARD: Cologuard: NEGATIVE

## 2021-08-12 ENCOUNTER — Other Ambulatory Visit: Payer: Self-pay | Admitting: Internal Medicine

## 2021-08-21 ENCOUNTER — Ambulatory Visit (INDEPENDENT_AMBULATORY_CARE_PROVIDER_SITE_OTHER): Payer: 59 | Admitting: Nurse Practitioner

## 2021-08-21 ENCOUNTER — Encounter: Payer: Self-pay | Admitting: Nurse Practitioner

## 2021-08-21 ENCOUNTER — Ambulatory Visit
Admission: RE | Admit: 2021-08-21 | Discharge: 2021-08-21 | Disposition: A | Discharge status: Home / Self Care | Payer: 59 | Source: Ambulatory Visit | Attending: Nurse Practitioner | Admitting: Nurse Practitioner

## 2021-08-21 ENCOUNTER — Other Ambulatory Visit: Payer: Self-pay

## 2021-08-21 VITALS — BP 132/74 | HR 74 | Temp 98.7°F | Ht 67.0 in | Wt 207.0 lb

## 2021-08-21 DIAGNOSIS — R0789 Other chest pain: Secondary | ICD-10-CM

## 2021-08-21 DIAGNOSIS — M791 Myalgia, unspecified site: Secondary | ICD-10-CM

## 2021-08-21 MED ORDER — CYCLOBENZAPRINE HCL 10 MG PO TABS: 10.0000 mg | ORAL_TABLET | Freq: Every evening | ORAL | 1 refills | Status: DC | PRN

## 2021-08-21 MED ORDER — OMEPRAZOLE 20 MG PO CPDR: 20.0000 mg | DELAYED_RELEASE_CAPSULE | Freq: Every day | ORAL | 1 refills | Status: DC

## 2021-08-21 NOTE — Patient Instructions (Signed)
If you are not better call back to office Go to [facility] on [address] for your xray you can walk in

## 2021-08-21 NOTE — Addendum Note (Signed)
Addended by: Mariam Dollar on: 08/21/2021 05:23 PM   Modules accepted: Orders

## 2021-08-21 NOTE — Progress Notes (Signed)
I,Jasmine Rodriguez,acting as a Neurosurgeon for [employer], FNP.,have documented all relevant documentation on the behalf of Jasmine Felts, FNP,as directed by  Jasmine Felts, FNP while in the presence of Jasmine Felts, FNP.   This visit occurred during the SARS-CoV-2 public health emergency.  Safety protocols were in place, including screening questions prior to the visit, additional usage of staff PPE, and extensive cleaning of exam room while observing appropriate contact time as indicated for disinfecting solutions.  Subjective:     Patient ID: Jasmine Rodriguez , female    DOB: 03-04-1956 , 65 y.o.   MRN: [id number]   Chief Complaint  Patient presents with   Chest Pain     HPI  She is having intermittent left sided chest discomfort under her breast area. Mostly dull but when occurs will be more intense. Symptoms began in last 2-3 days. Sometimes an antacid improves, when taking a deep breath has pain. Denies nausea/vomiting.     Past Medical History:  Diagnosis Date   DDD (degenerative disc disease)      Family History  Problem Relation Age of Onset   Hypertension Mother    Arthritis Mother    Congestive Heart Failure Mother    Hypertension Father    Head & neck cancer Father      Current Outpatient Medications:    Accu-Chek FastClix Lancets MISC, USE AS DIRECTED TO CHECK BLOOD SUGARS 2 TIMES PER DAY DX:E11.65, Disp: 102 each, Rfl: 0   ACCU-CHEK SMARTVIEW test strip, USE AS INSTRUCTED TO CHECK BLOOD SUGARS DAILY DX: E11.22, Disp: 50 strip, Rfl: 2   acetaminophen (TYLENOL) 500 MG tablet, Take 500 mg by mouth every 6 (six) hours as needed., Disp: , Rfl:    albuterol (PROAIR HFA) 108 (90 Base) MCG/ACT inhaler, Inhale 2 puffs into the lungs every 6 (six) hours as needed for wheezing or shortness of breath., Disp: 18 g, Rfl: 3   Calcium Carbonate (CALTRATE 600 PO), Take by mouth. daily, Disp: , Rfl:    cholecalciferol (VITAMIN D) 1000 UNITS tablet, Take 2,000 Units by mouth daily.,  Disp: , Rfl:    glucosamine-chondroitin 500-400 MG tablet, Take 1 tablet by mouth 2 (two) times daily. , Disp: , Rfl:    lactase (LACTAID) 3000 UNITS tablet, Take 1 tablet by mouth 3 (three) times daily as needed. Dairy consumption, Disp: , Rfl:    lidocaine (LIDODERM) 5 %, PLACE 3 PATCHES ONTO SKIN DAILY AS NEEDED FOR PAIN,REMOVE & DISCARD PATCH WITHIN 12HRS/AS DIRECTED, Disp: 90 patch, Rfl: 2   nystatin (MYCOSTATIN/NYSTOP) powder, Use as directed, Disp: 60 g, Rfl: 1   omeprazole (PRILOSEC) 20 MG capsule, Take 1 capsule (20 mg total) by mouth daily., Disp: 30 capsule, Rfl: 1   pravastatin (PRAVACHOL) 40 MG tablet, TAKE 1 TABLET BY MOUTH EVERY DAY IN THE EVENING, Disp: 90 tablet, Rfl: 2   sitaGLIPtin-metformin (JANUMET) 50-500 MG tablet, One tab po bid, Disp: 180 tablet, Rfl: 2   traMADol (ULTRAM) 50 MG tablet, Take 1 tablet (50 mg total) by mouth every 8 (eight) hours as needed., Disp: 40 tablet, Rfl: 5   valACYclovir (VALTREX) 500 MG tablet, TAKE 1 TABLET BY MOUTH EVERY DAY, Disp: 90 tablet, Rfl: 1   cyclobenzaprine (FLEXERIL) 10 MG tablet, Take 1 tablet (10 mg total) by mouth at bedtime as needed for muscle spasms., Disp: 30 tablet, Rfl: 1   fluocinonide (LIDEX) 0.05 % external solution, fluocinonide 0.05 % topical solution  APPLY 1 ML ON THE SKIN AS DIRECTED USE  NIGHTLY, Disp: , Rfl:    Allergies  Allergen Reactions   Skelaxin [Metaxalone] Anaphylaxis, Itching and Rash   Lisinopril      Review of Systems  Constitutional: Negative.   Respiratory: Negative.    Cardiovascular:  Positive for chest pain (chest discomfort). Negative for palpitations and leg swelling.  Gastrointestinal: Negative.  Negative for abdominal distention, abdominal pain, constipation, diarrhea, nausea and vomiting.  Neurological: Negative.  Negative for dizziness and headaches.  Psychiatric/Behavioral: Negative.      Today's Vitals   08/21/21 1424  BP: 132/74  Pulse: 74  Temp: 98.7 F (37.1 C)  Weight: 207 lb  (93.9 kg)  Height: 5\' 7"  (1.702 m)  PainSc: 3   PainLoc: Head   Body mass index is 32.42 kg/m.  Wt Readings from Last 3 Encounters:  08/21/21 207 lb (93.9 kg)  05/31/21 216 lb 6.4 oz (98.2 kg)  05/25/21 218 lb (98.9 kg)    BP Readings from Last 3 Encounters:  08/21/21 132/74  05/31/21 122/70  05/25/21 136/81    Objective:  Physical Exam Vitals reviewed.  Constitutional:      General: She is not in acute distress.    Appearance: Normal appearance. She is well-developed.  Cardiovascular:     Rate and Rhythm: Normal rate and regular rhythm.     Pulses: Normal pulses.     Heart sounds: Normal heart sounds. No murmur heard. Pulmonary:     Effort: Pulmonary effort is normal. No respiratory distress.     Breath sounds: Normal breath sounds.  Abdominal:     General: Bowel sounds are normal. There is no abdominal bruit.     Palpations: Abdomen is soft.     Tenderness: There is no abdominal tenderness.  Musculoskeletal:     Right lower leg: No tenderness.     Left lower leg: Tenderness (posterior back) present.  Skin:    Capillary Refill: Capillary refill takes less than 2 seconds.  Neurological:     General: No focal deficit present.     Mental Status: She is alert and oriented to person, place, and time.  Psychiatric:        Mood and Affect: Mood normal.        Behavior: Behavior normal.        Thought Content: Thought content normal.        Judgment: Judgment normal.        Assessment And Plan:     1. Chest discomfort Comments: Pleuritis vs costochondritis vs heart burn vs cardiac (least likely) EKG NSR.  Take omeprazole and ordered CXR - DG Chest 2 View; Future - omeprazole (PRILOSEC) 20 MG capsule; Take 1 capsule (20 mg total) by mouth daily.  Dispense: 30 capsule; Refill: 1  2. Muscle tension pain Comments: She has tension to posterior back on palpation - cyclobenzaprine (FLEXERIL) 10 MG tablet; Take 1 tablet (10 mg total) by mouth at bedtime as needed for  muscle spasms.  Dispense: 30 tablet; Refill: 1    Patient was given opportunity to ask questions. Patient verbalized understanding of the plan and was able to repeat key elements of the plan. All questions were answered to their satisfaction.  05/27/21, FNP   I, Jasmine Felts, FNP, have reviewed all documentation for this visit. The documentation on 08/21/21 for the exam, diagnosis, procedures, and orders are all accurate and complete.   IF YOU HAVE BEEN REFERRED TO A SPECIALIST, IT MAY TAKE 1-2 WEEKS TO SCHEDULE/PROCESS THE REFERRAL. IF YOU HAVE  NOT HEARD FROM US/SPECIALIST IN TWO WEEKS, PLEASE GIVE [place] A CALL AT [phone] X [phone].   THE PATIENT IS ENCOURAGED TO PRACTICE SOCIAL DISTANCING DUE TO THE COVID-19 PANDEMIC.

## 2021-08-22 ENCOUNTER — Encounter: Payer: Self-pay | Admitting: Nurse Practitioner

## 2021-08-23 ENCOUNTER — Other Ambulatory Visit: Payer: Self-pay | Admitting: Nurse Practitioner

## 2021-08-23 LAB — EXTERNAL GENERIC LAB PROCEDURE: COLOGUARD: NEGATIVE

## 2021-08-23 LAB — COLOGUARD: COLOGUARD: NEGATIVE

## 2021-08-23 MED ORDER — AMLODIPINE BESYLATE 2.5 MG PO TABS: 2.5000 mg | ORAL_TABLET | Freq: Every day | ORAL | 2 refills | Status: DC

## 2021-09-11 ENCOUNTER — Other Ambulatory Visit: Payer: Self-pay | Admitting: Internal Medicine

## 2021-09-11 ENCOUNTER — Other Ambulatory Visit: Payer: Self-pay | Admitting: Nurse Practitioner

## 2021-09-11 DIAGNOSIS — G8929 Other chronic pain: Secondary | ICD-10-CM

## 2021-09-12 ENCOUNTER — Other Ambulatory Visit: Payer: Self-pay | Admitting: Internal Medicine

## 2021-09-13 ENCOUNTER — Other Ambulatory Visit: Payer: Self-pay | Admitting: Nurse Practitioner

## 2021-09-13 DIAGNOSIS — R0789 Other chest pain: Secondary | ICD-10-CM

## 2021-09-18 ENCOUNTER — Encounter: Payer: Self-pay | Admitting: Nurse Practitioner

## 2021-10-04 ENCOUNTER — Other Ambulatory Visit: Payer: Self-pay

## 2021-10-04 ENCOUNTER — Other Ambulatory Visit: Payer: Self-pay | Admitting: Internal Medicine

## 2021-10-04 ENCOUNTER — Ambulatory Visit (INDEPENDENT_AMBULATORY_CARE_PROVIDER_SITE_OTHER): Payer: 59 | Admitting: Nurse Practitioner

## 2021-10-04 ENCOUNTER — Encounter: Payer: Self-pay | Admitting: Nurse Practitioner

## 2021-10-04 VITALS — BP 128/76 | HR 83 | Temp 98.8°F | Ht 67.0 in | Wt 207.4 lb

## 2021-10-04 DIAGNOSIS — Z6832 Body mass index (BMI) 32.0-32.9, adult: Secondary | ICD-10-CM

## 2021-10-04 DIAGNOSIS — E6609 Other obesity due to excess calories: Secondary | ICD-10-CM

## 2021-10-04 DIAGNOSIS — M5442 Lumbago with sciatica, left side: Secondary | ICD-10-CM | POA: Diagnosis not present

## 2021-10-04 DIAGNOSIS — E782 Mixed hyperlipidemia: Secondary | ICD-10-CM

## 2021-10-04 DIAGNOSIS — E1169 Type 2 diabetes mellitus with other specified complication: Secondary | ICD-10-CM

## 2021-10-04 DIAGNOSIS — I10 Essential (primary) hypertension: Secondary | ICD-10-CM

## 2021-10-04 DIAGNOSIS — G8929 Other chronic pain: Secondary | ICD-10-CM | POA: Diagnosis not present

## 2021-10-04 DIAGNOSIS — E785 Hyperlipidemia, unspecified: Secondary | ICD-10-CM

## 2021-10-04 LAB — CMP14+EGFR
ALT: 18 IU/L (ref 0–32)
AST: 17 IU/L (ref 0–40)
Albumin/Globulin Ratio: 1.7 (ref 1.2–2.2)
Albumin: 4.4 g/dL (ref 3.8–4.8)
Alkaline Phosphatase: 97 IU/L (ref 44–121)
BUN/Creatinine Ratio: 13 (ref 12–28)
BUN: 11 mg/dL (ref 8–27)
Bilirubin Total: 0.4 mg/dL (ref 0.0–1.2)
CO2: 27 mmol/L (ref 20–29)
Calcium: 9.6 mg/dL (ref 8.7–10.3)
Chloride: 101 mmol/L (ref 96–106)
Creatinine, Ser: 0.88 mg/dL (ref 0.57–1.00)
Globulin, Total: 2.6 g/dL (ref 1.5–4.5)
Glucose: 107 mg/dL — ABNORMAL HIGH (ref 70–99)
Potassium: 4.4 mmol/L (ref 3.5–5.2)
Sodium: 141 mmol/L (ref 134–144)
Total Protein: 7 g/dL (ref 6.0–8.5)
eGFR: 74 mL/min/{1.73_m2} (ref >59)

## 2021-10-04 LAB — CBC
Hematocrit: 37.8 % (ref 34.0–46.6)
Hemoglobin: 12.8 g/dL (ref 11.1–15.9)
MCH: 28.4 pg (ref 26.6–33.0)
MCHC: 33.9 g/dL (ref 31.5–35.7)
MCV: 84 fL (ref 79–97)
Platelets: 243 10*3/uL (ref 150–450)
RBC: 4.5 x10E6/uL (ref 3.77–5.28)
RDW: 13.5 % (ref 11.7–15.4)
WBC: 8.7 10*3/uL (ref 3.4–10.8)

## 2021-10-04 LAB — HEMOGLOBIN A1C
Est. average glucose Bld gHb Est-mCnc: 140 mg/dL
Hgb A1c MFr Bld: 6.5 % — ABNORMAL HIGH (ref 4.8–5.6)

## 2021-10-04 LAB — LIPID PANEL
Chol/HDL Ratio: 2.8 ratio (ref 0.0–4.4)
Cholesterol, Total: 193 mg/dL (ref 100–199)
HDL: 69 mg/dL (ref >39)
LDL Chol Calc (NIH): 115 mg/dL — ABNORMAL HIGH (ref 0–99)
Triglycerides: 50 mg/dL (ref 0–149)
VLDL Cholesterol Cal: 9 mg/dL (ref 5–40)

## 2021-10-04 MED ORDER — TRAMADOL HCL 50 MG PO TABS: 50.0000 mg | ORAL_TABLET | Freq: Three times a day (TID) | ORAL | 1 refills | Status: DC | PRN

## 2021-10-04 NOTE — Progress Notes (Signed)
I,Tianna Badgett,acting as a Education administrator for [employer], NP.,have documented all relevant documentation on the behalf of [employer], NP,as directed by  Bary Castilla, NP while in the presence of Bary Castilla, NP.  This visit occurred during the SARS-CoV-2 public health emergency.  Safety protocols were in place, including screening questions prior to the visit, additional usage of staff PPE, and extensive cleaning of exam room while observing appropriate contact time as indicated for disinfecting solutions.  Subjective:     Patient ID: Jasmine Rodriguez , female    DOB: [birth year] , 66 y.o.   MRN: [id number]   Chief Complaint  Patient presents with   Diabetes    HPI  She presents today for diabetes check. She reports compliance with meds. She states her sugars are well controlled.  She is taking care of her mom with her siblings. She has dogs.  Diet She tries to eat healthy.  Exercise: She walks with the dogs  Wt Readings from Last 3 Encounters: 10/04/21 : 207 lb 6.4 oz (94.1 kg) 08/21/21 : 207 lb (93.9 kg) 05/31/21 : 216 lb 6.4 oz (98.2 kg)    Diabetes She presents for her follow-up diabetic visit. She has type 2 diabetes mellitus. Pertinent negatives for hypoglycemia include no headaches. Pertinent negatives for diabetes include no blurred vision and no chest pain. There are no hypoglycemic complications. Risk factors for coronary artery disease include diabetes mellitus, obesity, dyslipidemia, sedentary lifestyle and post-menopausal. She is compliant with treatment most of the time. She is following a diabetic diet. She participates in exercise intermittently.    Past Medical History:  Diagnosis Date   DDD (degenerative disc disease)      Family History  Problem Relation Age of Onset   Hypertension Mother    Arthritis Mother    Congestive Heart Failure Mother    Hypertension Father    Head & neck cancer Father      Current Outpatient Medications:     Accu-Chek FastClix Lancets MISC, USE AS DIRECTED TO CHECK BLOOD SUGARS 2 TIMES PER DAY DX:E11.65, Disp: 102 each, Rfl: 0   ACCU-CHEK SMARTVIEW test strip, USE AS INSTRUCTED TO CHECK BLOOD SUGARS DAILY DX: E11.22, Disp: 50 strip, Rfl: 2   acetaminophen (TYLENOL) 500 MG tablet, Take 500 mg by mouth every 6 (six) hours as needed., Disp: , Rfl:    albuterol (PROAIR HFA) 108 (90 Base) MCG/ACT inhaler, Inhale 2 puffs into the lungs every 6 (six) hours as needed for wheezing or shortness of breath., Disp: 18 g, Rfl: 3   amLODipine (NORVASC) 2.5 MG tablet, TAKE 1 TABLET BY MOUTH EVERY DAY, Disp: 90 tablet, Rfl: 1   Calcium Carbonate (CALTRATE 600 PO), Take by mouth. daily, Disp: , Rfl:    cholecalciferol (VITAMIN D) 1000 UNITS tablet, Take 2,000 Units by mouth daily., Disp: , Rfl:    cyclobenzaprine (FLEXERIL) 10 MG tablet, Take 1 tablet (10 mg total) by mouth at bedtime as needed for muscle spasms., Disp: 30 tablet, Rfl: 1   fluocinonide (LIDEX) 0.05 % external solution, fluocinonide 0.05 % topical solution  APPLY 1 ML ON THE SKIN AS DIRECTED USE NIGHTLY, Disp: , Rfl:    glucosamine-chondroitin 500-400 MG tablet, Take 1 tablet by mouth 2 (two) times daily. , Disp: , Rfl:    lactase (LACTAID) 3000 UNITS tablet, Take 1 tablet by mouth 3 (three) times daily as needed. Dairy consumption, Disp: , Rfl:    lidocaine (LIDODERM) 5 %, PLACE 3 PATCHES ONTO SKIN DAILY AS  NEEDED FOR PAIN,REMOVE & DISCARD PATCH WITHIN 12HRS/AS DIRECTED, Disp: 90 patch, Rfl: 2   nystatin (MYCOSTATIN/NYSTOP) powder, USE AS DIRECTED, Disp: 60 g, Rfl: 1   omeprazole (PRILOSEC) 20 MG capsule, TAKE 1 CAPSULE BY MOUTH EVERY DAY, Disp: 30 capsule, Rfl: 1   pravastatin (PRAVACHOL) 40 MG tablet, TAKE 1 TABLET BY MOUTH EVERY DAY IN THE EVENING, Disp: 90 tablet, Rfl: 2   sitaGLIPtin-metformin (JANUMET) 50-500 MG tablet, One tab po bid, Disp: 180 tablet, Rfl: 2   valACYclovir (VALTREX) 500 MG tablet, TAKE 1 TABLET BY MOUTH EVERY DAY, Disp: 90 tablet,  Rfl: 1   traMADol (ULTRAM) 50 MG tablet, Take 1 tablet (50 mg total) by mouth every 8 (eight) hours as needed., Disp: 40 tablet, Rfl: 1   Allergies  Allergen Reactions   Skelaxin [Metaxalone] Anaphylaxis, Itching and Rash   Lisinopril      Review of Systems  Constitutional: Negative.  Negative for chills and fever.  HENT:  Negative for congestion.   Eyes:  Negative for blurred vision.  Respiratory: Negative.  Negative for cough, shortness of breath and wheezing.   Cardiovascular: Negative.  Negative for chest pain.  Gastrointestinal: Negative.  Negative for constipation and diarrhea.  Musculoskeletal:  Positive for arthralgias and myalgias.  Neurological: Negative.  Negative for headaches.    Today's Vitals   10/04/21 0830  BP: 128/76  Pulse: 83  Temp: 98.8 F (37.1 C)  TempSrc: Oral  Weight: 207 lb 6.4 oz (94.1 kg)  Height: '5\' 7"'  (1.702 m)   Body mass index is 32.48 kg/m.  Wt Readings from Last 3 Encounters:  10/04/21 207 lb 6.4 oz (94.1 kg)  08/21/21 207 lb (93.9 kg)  05/31/21 216 lb 6.4 oz (98.2 kg)    Objective:  Physical Exam Constitutional:      Appearance: Normal appearance.  HENT:     Head: Normocephalic and atraumatic.  Cardiovascular:     Rate and Rhythm: Regular rhythm.     Pulses: Normal pulses.     Heart sounds: Normal heart sounds. No murmur heard. Pulmonary:     Effort: Pulmonary effort is normal. No respiratory distress.     Breath sounds: Normal breath sounds. No wheezing.  Skin:    General: Skin is warm and dry.     Capillary Refill: Capillary refill takes less than 2 seconds.  Neurological:     Mental Status: She is alert.        Assessment And Plan:     1. Type 2 diabetes mellitus with hyperlipidemia (HCC) -Chronic, continue meds  --Discussed with patient the importance of glycemic control and long term complications from uncontrolled diabetes. Discussed with the patient the importance of compliance with home glucose monitoring, diet  which includes decrease amount of sugary drinks and foods. Importance of exercise was also discussed with the patient. Importance of eye exams, self foot care and compliance to office visits was also discussed with the patient.  - Hemoglobin A1c  2. Essential hypertension, benign -Chronic, continue meds  -Limit the intake of processed foods and salt intake. You should increase your intake of green vegetables and fruits. Limit the use of alcohol. Limit fast foods and fried foods. Avoid high fatty saturated and trans fat foods. Keep yourself hydrated with drinking water. Avoid red meats. Eat lean meats instead. Exercise for atleast 30-45 min for atleast 4-5 times a week.  - CMP14+EGFR - CBC no Diff  3. Chronic left-sided low back pain with left-sided sciatica -Chronic - traMADol (ULTRAM)  50 MG tablet; Take 1 tablet (50 mg total) by mouth every 8 (eight) hours as needed.  Dispense: 40 tablet; Refill: 1  4. Mixed hyperlipidemia --Educated patient about a diet that is low in fat and high fatty foods including dairy products. Increase in take of fish and fiber. Decrease intake of red meats and fast foods. Exercise for atleast 4-5 times a week or atleast 30-45 min. Drink a lot of water.   - Lipid panel  5. Class 1 obesity due to excess calories with serious comorbidity and body mass index (BMI) of 32.0 to 32.9 in adult Advised patient on a healthy diet including avoiding fast food and red meats. Increase the intake of lean meats including grilled chicken and [place].  Drink a lot of water. Decrease intake of fatty foods. Exercise for 30-45 min. 4-5 a week to decrease the risk of cardiac event.   The patient was encouraged to call or send a message through [place] for any questions or concerns.   Follow up: if symptoms persist or do not get better.   Side effects and appropriate use of all the medication(s) were discussed with the patient today. Patient advised to use the medication(s) as directed by  their healthcare provider. The patient was encouraged to read, review, and understand all associated package inserts and contact our office with any questions or concerns. The patient accepts the risks of the treatment plan and had an opportunity to ask questions.   Patient was given opportunity to ask questions. Patient verbalized understanding of the plan and was able to repeat key elements of the plan. All questions were answered to their satisfaction.  Raman Arlind Klingerman, DNP   I, Raman Jakirah Zaun have reviewed all documentation for this visit. The documentation on 10/04/20 for the exam, diagnosis, procedures, and orders are all accurate and complete.      IF YOU HAVE BEEN REFERRED TO A SPECIALIST, IT MAY TAKE 1-2 WEEKS TO SCHEDULE/PROCESS THE REFERRAL. IF YOU HAVE NOT HEARD FROM US/SPECIALIST IN TWO WEEKS, PLEASE GIVE [place] A CALL AT [phone].   THE PATIENT IS ENCOURAGED TO PRACTICE SOCIAL DISTANCING DUE TO THE COVID-19 PANDEMIC.

## 2021-10-04 NOTE — Patient Instructions (Signed)

## 2021-10-10 ENCOUNTER — Other Ambulatory Visit: Payer: Self-pay | Admitting: Nurse Practitioner

## 2021-10-10 DIAGNOSIS — R0789 Other chest pain: Secondary | ICD-10-CM

## 2021-10-11 ENCOUNTER — Encounter: Payer: Self-pay | Admitting: Internal Medicine

## 2021-10-12 ENCOUNTER — Other Ambulatory Visit: Payer: Self-pay | Admitting: Internal Medicine

## 2021-10-12 DIAGNOSIS — G8929 Other chronic pain: Secondary | ICD-10-CM

## 2021-10-12 DIAGNOSIS — M5442 Lumbago with sciatica, left side: Secondary | ICD-10-CM

## 2021-11-03 ENCOUNTER — Other Ambulatory Visit: Payer: Self-pay | Admitting: Nurse Practitioner

## 2021-11-03 DIAGNOSIS — R0789 Other chest pain: Secondary | ICD-10-CM

## 2021-11-25 ENCOUNTER — Other Ambulatory Visit: Payer: Self-pay | Admitting: Nurse Practitioner

## 2021-11-25 DIAGNOSIS — R0789 Other chest pain: Secondary | ICD-10-CM

## 2021-11-27 ENCOUNTER — Other Ambulatory Visit: Payer: Self-pay

## 2021-11-27 ENCOUNTER — Encounter: Payer: Self-pay | Admitting: Registered Nurse

## 2021-11-27 ENCOUNTER — Encounter: Payer: 59 | Attending: Registered Nurse | Admitting: Registered Nurse

## 2021-11-27 VITALS — BP 148/85 | HR 74 | Ht 67.0 in | Wt 203.6 lb

## 2021-11-27 DIAGNOSIS — Z79899 Other long term (current) drug therapy: Secondary | ICD-10-CM | POA: Insufficient documentation

## 2021-11-27 DIAGNOSIS — M545 Low back pain, unspecified: Secondary | ICD-10-CM | POA: Diagnosis not present

## 2021-11-27 DIAGNOSIS — M778 Other enthesopathies, not elsewhere classified: Secondary | ICD-10-CM | POA: Insufficient documentation

## 2021-11-27 DIAGNOSIS — M25532 Pain in left wrist: Secondary | ICD-10-CM | POA: Insufficient documentation

## 2021-11-27 DIAGNOSIS — G8929 Other chronic pain: Secondary | ICD-10-CM | POA: Insufficient documentation

## 2021-11-27 DIAGNOSIS — Z5181 Encounter for therapeutic drug level monitoring: Secondary | ICD-10-CM | POA: Diagnosis not present

## 2021-11-27 DIAGNOSIS — G894 Chronic pain syndrome: Secondary | ICD-10-CM | POA: Insufficient documentation

## 2021-11-27 DIAGNOSIS — M1712 Unilateral primary osteoarthritis, left knee: Secondary | ICD-10-CM | POA: Insufficient documentation

## 2021-11-27 MED ORDER — METHYLPREDNISOLONE 4 MG PO TBPK: ORAL_TABLET | ORAL | 0 refills | Status: DC

## 2021-11-27 NOTE — Progress Notes (Signed)
Subjective:    Patient ID: Jasmine Rodriguez, female    DOB: June 02, 1956, 66 y.o.   MRN: [id number]  HPI: Jasmine Rodriguez is a 66 y.o. female who returns for follow up appointment for chronic pain and medication refill. She states  her pain is located in her left shoulder and left wrist. She also reports about three days ago she notice bone protruding skin at her left wrist, bone protruding noted and X-ray ordered, she verbalizes understanding. She also reports left wrist pain. She rates her pain 7. Her current exercise regime is walking and performing stretching exercises.  Jasmine Rodriguez: PCP : Prescribing her Tramadol. We will continue to monitor.    Pain Inventory Average Pain 6 Pain Right Now 7 My pain is intermittent, sharp, burning, and aching  In the last 24 hours, has pain interfered with the following? General activity 6 Relation with others 9 Enjoyment of life 9 What TIME of day is your pain at its worst? varies Sleep (in general) Good  Pain is worse with: inactivity and some activites Pain improves with: medication Relief from Meds: 7  Family History  Problem Relation Age of Onset   Hypertension Mother    Arthritis Mother    Congestive Heart Failure Mother    Hypertension Father    Head & neck cancer Father    Social History   Socioeconomic History   Marital status: Divorced    Spouse name: Not on file   Number of children: Not on file   Years of education: Not on file   Highest education level: Not on file  Occupational History   Not on file  Tobacco Use   Smoking status: Never   Smokeless tobacco: Never  Vaping Use   Vaping Use: Never used  Substance and Sexual Activity   Alcohol use: No   Drug use: No   Sexual activity: Not on file  Other Topics Concern   Not on file  Social History Narrative   Not on file   Social Determinants of Health   Financial Resource Strain: Not on file  Food Insecurity: Not on file  Transportation Needs: Not on file   Physical Activity: Not on file  Stress: Not on file  Social Connections: Not on file   Past Surgical History:  Procedure Laterality Date   BREAST REDUCTION SURGERY     CESAREAN SECTION     KNEE SURGERY     TUBAL LIGATION     Past Surgical History:  Procedure Laterality Date   BREAST REDUCTION SURGERY     CESAREAN SECTION     KNEE SURGERY     TUBAL LIGATION     Past Medical History:  Diagnosis Date   DDD (degenerative disc disease)    BP (!) 163/94    Pulse 85    Ht 5\' 7"  (1.702 m)    Wt 203 lb 9.6 oz (92.4 kg)    SpO2 98%    BMI 31.89 kg/m   Opioid Risk Score:   Fall Risk Score:  `1  Depression screen PHQ 2/9  Depression screen [facility] 2/9 11/27/2021 05/25/2021 10/13/2020 10/15/2019 09/29/2019 06/11/2019 11/03/2018  Decreased Interest 1 1 0 2 0 0 0  Down, Depressed, Hopeless 1 1 0 2 0 0 0  PHQ - 2 Score 2 2 0 4 0 0 0  Altered sleeping - - 0 0 - - -  Tired, decreased energy - - 1 3 - - -  Change in appetite - -  0 2 - - -  Feeling bad or failure about yourself  - - 0 0 - - -  Trouble concentrating - - 0 0 - - -  Moving slowly or fidgety/restless - - 0 0 - - -  Suicidal thoughts - - 0 0 - - -  PHQ-9 Score - - 1 9 - - -  Difficult doing work/chores - - Not difficult at all Somewhat difficult - - -     Review of Systems  Constitutional: Negative.   HENT: Negative.    Eyes: Negative.   Respiratory: Negative.    Cardiovascular: Negative.   Gastrointestinal: Negative.   Endocrine: Negative.   Genitourinary: Negative.   Musculoskeletal: Negative.   Skin: Negative.   Allergic/Immunologic: Negative.   Neurological: Negative.   Hematological: Negative.   Psychiatric/Behavioral:  Positive for dysphoric mood.       Objective:   Physical Exam Vitals and nursing note reviewed.  Constitutional:      Appearance: Normal appearance.  Cardiovascular:     Rate and Rhythm: Normal rate and regular rhythm.     Pulses: Normal pulses.     Heart sounds: Normal heart sounds.   Pulmonary:     Effort: Pulmonary effort is normal.     Breath sounds: Normal breath sounds.  Musculoskeletal:     Cervical back: Normal range of motion and neck supple.     Comments: Normal Muscle Bulk and Muscle Testing Reveals:  Upper Extremities: Full ROM and Muscle Strength 5/5 Left AC Joint Tenderness Lower Extremities: Full ROM and Muscle Strength 5/5 Left Lower Extremity Flexion Produces Pain into her Left Patella Arises from chair with ease Narrow Based  Gait     Skin:    General: Skin is warm and dry.  Neurological:     Mental Status: She is alert and oriented to person, place, and time.  Psychiatric:        Mood and Affect: Mood normal.        Behavior: Behavior normal.         Assessment & Plan:  Cervicalgia: No Complaints today. Continue HEP as Tolerated. Continue to Monitor. 11/27/2021 2. Left Shoulder Tendonitis:RX: Medrol Dose Pak . Continue to Alternate Ice and Heat Therapy. Continue to Monitor. 11/27/2021 3. Left Wrist Pain: RX: X-ray. Continue to Monitor. 4. Neuropathic Pain: Right Hand: Continue current medication Regimen. Continue to monitor. 11/27/2021 4. Left Knee Pain: No complaints today. Continue to Monitor. 11/27/2021 5. Chronic Pain Syndrome: Continue Tramadol. PCP Prescribing. Continue to Monitor. 11/27/2021   F/U in 6 months

## 2021-11-28 ENCOUNTER — Encounter: Payer: 59 | Admitting: Registered Nurse

## 2021-12-19 ENCOUNTER — Other Ambulatory Visit: Payer: Self-pay | Admitting: Nurse Practitioner

## 2021-12-19 DIAGNOSIS — R0789 Other chest pain: Secondary | ICD-10-CM

## 2022-01-13 ENCOUNTER — Other Ambulatory Visit: Payer: Self-pay | Admitting: Nurse Practitioner

## 2022-01-13 DIAGNOSIS — R0789 Other chest pain: Secondary | ICD-10-CM

## 2022-01-31 ENCOUNTER — Encounter: Payer: Self-pay | Admitting: Internal Medicine

## 2022-01-31 ENCOUNTER — Other Ambulatory Visit: Payer: Self-pay | Admitting: Internal Medicine

## 2022-01-31 MED ORDER — ACCU-CHEK FASTCLIX LANCETS MISC: 0 refills | Status: DC

## 2022-02-06 ENCOUNTER — Other Ambulatory Visit: Payer: Self-pay | Admitting: Internal Medicine

## 2022-02-06 DIAGNOSIS — R0789 Other chest pain: Secondary | ICD-10-CM

## 2022-02-12 ENCOUNTER — Other Ambulatory Visit: Payer: Self-pay | Admitting: Internal Medicine

## 2022-02-16 ENCOUNTER — Other Ambulatory Visit: Payer: Self-pay | Admitting: Nurse Practitioner

## 2022-02-16 DIAGNOSIS — M791 Myalgia, unspecified site: Secondary | ICD-10-CM

## 2022-02-19 ENCOUNTER — Other Ambulatory Visit: Payer: Self-pay | Admitting: Internal Medicine

## 2022-03-02 ENCOUNTER — Other Ambulatory Visit: Payer: Self-pay | Admitting: Internal Medicine

## 2022-03-02 DIAGNOSIS — R0789 Other chest pain: Secondary | ICD-10-CM

## 2022-04-01 ENCOUNTER — Other Ambulatory Visit: Payer: Self-pay | Admitting: Nurse Practitioner

## 2022-04-01 DIAGNOSIS — R0789 Other chest pain: Secondary | ICD-10-CM

## 2022-04-03 ENCOUNTER — Encounter: Payer: Self-pay | Admitting: Internal Medicine

## 2022-04-04 ENCOUNTER — Ambulatory Visit (INDEPENDENT_AMBULATORY_CARE_PROVIDER_SITE_OTHER): Payer: 59 | Admitting: Internal Medicine

## 2022-04-04 ENCOUNTER — Encounter: Payer: Self-pay | Admitting: Internal Medicine

## 2022-04-04 VITALS — BP 112/60 | HR 87 | Temp 98.1°F | Ht 66.8 in | Wt 191.8 lb

## 2022-04-04 DIAGNOSIS — R42 Dizziness and giddiness: Secondary | ICD-10-CM | POA: Diagnosis not present

## 2022-04-04 DIAGNOSIS — E785 Hyperlipidemia, unspecified: Secondary | ICD-10-CM | POA: Diagnosis not present

## 2022-04-04 DIAGNOSIS — I10 Essential (primary) hypertension: Secondary | ICD-10-CM | POA: Diagnosis not present

## 2022-04-04 DIAGNOSIS — Z2821 Immunization not carried out because of patient refusal: Secondary | ICD-10-CM

## 2022-04-04 DIAGNOSIS — E1169 Type 2 diabetes mellitus with other specified complication: Secondary | ICD-10-CM

## 2022-04-04 DIAGNOSIS — E6609 Other obesity due to excess calories: Secondary | ICD-10-CM

## 2022-04-04 DIAGNOSIS — M5442 Lumbago with sciatica, left side: Secondary | ICD-10-CM | POA: Diagnosis not present

## 2022-04-04 DIAGNOSIS — Z6830 Body mass index (BMI) 30.0-30.9, adult: Secondary | ICD-10-CM

## 2022-04-04 DIAGNOSIS — G8929 Other chronic pain: Secondary | ICD-10-CM

## 2022-04-04 LAB — POCT URINALYSIS DIPSTICK
Bilirubin, UA: NEGATIVE
Glucose, UA: NEGATIVE
Ketones, UA: NEGATIVE
Leukocytes, UA: NEGATIVE
Nitrite, UA: NEGATIVE
Protein, UA: POSITIVE — AB
Spec Grav, UA: >=1.03 — AB (ref 1.010–1.025)
Urobilinogen, UA: 0.2 E.U./dL
pH, UA: 6 (ref 5.0–8.0)

## 2022-04-04 MED ORDER — ALBUTEROL SULFATE HFA 108 (90 BASE) MCG/ACT IN AERS: 2.0000 | INHALATION_SPRAY | Freq: Four times a day (QID) | RESPIRATORY_TRACT | 3 refills | Status: DC | PRN

## 2022-04-04 MED ORDER — FLUOCINONIDE 0.05 % EX SOLN: CUTANEOUS | 3 refills | Status: DC

## 2022-04-04 MED ORDER — LIDOCAINE 5 % EX PTCH: MEDICATED_PATCH | CUTANEOUS | 2 refills | Status: DC

## 2022-04-04 NOTE — Patient Instructions (Signed)

## 2022-04-04 NOTE — Progress Notes (Signed)
[name],acting as a Education administrator for Jasmine Greenland, MD.,have documented all relevant documentation on the behalf of Jasmine Greenland, MD,as directed by  Jasmine Greenland, MD while in the presence of Jasmine Greenland, MD.  This visit occurred during the SARS-CoV-2 public health emergency.  Safety protocols were in place, including screening questions prior to the visit, additional usage of staff PPE, and extensive cleaning of exam room while observing appropriate contact time as indicated for disinfecting solutions.  Subjective:     Patient ID: Jasmine Rodriguez , female    DOB: Jan 16, 1956 , 66 y.o.   MRN: [id number]   Chief Complaint  Patient presents with   Diabetes    HPI  She presents today for diabetes and bp check. She reports compliance with meds. She states her sugars are well controlled.  She reports she was supposed to receive a coupon for a free glucometer from her insurance but never received it. She reports she has a disability exam coming up.    Diabetes She presents for her follow-up diabetic visit. She has type 2 diabetes mellitus. Pertinent negatives for hypoglycemia include no headaches. Pertinent negatives for diabetes include no blurred vision, no chest pain, no polydipsia, no polyphagia and no polyuria. There are no hypoglycemic complications. Risk factors for coronary artery disease include diabetes mellitus, obesity, dyslipidemia, sedentary lifestyle and post-menopausal. She is compliant with treatment most of the time. She is following a diabetic diet. She participates in exercise intermittently. An ACE inhibitor/angiotensin II receptor blocker is not being taken.  Hypertension This is a chronic problem. The current episode started more than 1 year ago. The problem has been gradually improving since onset. The problem is controlled. Pertinent negatives include no blurred vision, chest pain or headaches. Risk factors for coronary artery disease include diabetes  mellitus, post-menopausal state and sedentary lifestyle. Past treatments include calcium channel blockers. The current treatment provides moderate improvement.     Past Medical History:  Diagnosis Date   DDD (degenerative disc disease)    DM (diabetes mellitus) (HCC)    GERD (gastroesophageal reflux disease)    High cholesterol    HTN (hypertension)      Family History  Problem Relation Age of Onset   Hypertension Mother    Arthritis Mother    Congestive Heart Failure Mother    Hypertension Father    Head & neck cancer Father      Current Outpatient Medications:    Accu-Chek FastClix Lancets MISC, USE AS DIRECTED TO CHECK BLOOD SUGARS 2 TIMES PER DAY., Disp: 102 each, Rfl: 0   ACCU-CHEK SMARTVIEW test strip, USE AS INSTRUCTED TO CHECK BLOOD SUGARS DAILY DX: E11.22, Disp: 50 strip, Rfl: 2   acetaminophen (TYLENOL) 500 MG tablet, Take 500 mg by mouth every 6 (six) hours as needed., Disp: , Rfl:    amLODipine (NORVASC) 2.5 MG tablet, TAKE 1 TABLET BY MOUTH EVERY DAY, Disp: 90 tablet, Rfl: 1   Calcium Carbonate (CALTRATE 600 PO), Take by mouth. daily, Disp: , Rfl:    cholecalciferol (VITAMIN D) 1000 UNITS tablet, Take 2,000 Units by mouth daily., Disp: , Rfl:    cyclobenzaprine (FLEXERIL) 10 MG tablet, TAKE 1 TABLET BY MOUTH AT BEDTIME AS NEEDED FOR MUSCLE SPASMS, Disp: 30 tablet, Rfl: 1   glucosamine-chondroitin 500-400 MG tablet, Take 1 tablet by mouth 2 (two) times daily. , Disp: , Rfl:    lactase (LACTAID) 3000 UNITS tablet, Take 1 tablet by mouth 3 (three) times daily as  needed. Dairy consumption, Disp: , Rfl:    nystatin (MYCOSTATIN/NYSTOP) powder, USE AS DIRECTED, Disp: 60 g, Rfl: 1   pravastatin (PRAVACHOL) 40 MG tablet, TAKE 1 TABLET BY MOUTH EVERY DAY IN THE EVENING, Disp: 90 tablet, Rfl: 2   sitaGLIPtin-metformin (JANUMET) 50-500 MG tablet, TAKE 1 TABLET BY MOUTH EVERY DAY, Disp: 30 tablet, Rfl: 2   traMADol (ULTRAM) 50 MG tablet, Take 1 tablet (50 mg total) by mouth every 8  (eight) hours as needed., Disp: 40 tablet, Rfl: 1   valACYclovir (VALTREX) 500 MG tablet, TAKE 1 TABLET BY MOUTH EVERY DAY, Disp: 90 tablet, Rfl: 1   albuterol (PROAIR HFA) 108 (90 Base) MCG/ACT inhaler, Inhale 2 puffs into the lungs every 6 (six) hours as needed for wheezing or shortness of breath., Disp: 18 g, Rfl: 3   fluocinonide (LIDEX) 0.05 % external solution, APPLY 1 ML TO THE AFFECTED AREA NIGHTLY prn, Disp: 60 mL, Rfl: 3   lidocaine (LIDODERM) 5 %, Remove & Discard patch within 12 hours or as directed by MD, Disp: 90 patch, Rfl: 2   omeprazole (PRILOSEC) 20 MG capsule, TAKE 1 CAPSULE BY MOUTH EVERY DAY, Disp: 30 capsule, Rfl: 1   Allergies  Allergen Reactions   Skelaxin [Metaxalone] Anaphylaxis, Itching and Rash   Lisinopril      Review of Systems  Constitutional: Negative.   Eyes:  Negative for blurred vision.  Respiratory: Negative.    Cardiovascular: Negative.  Negative for chest pain.  Endocrine: Negative for polydipsia, polyphagia and polyuria.  Musculoskeletal:  Positive for back pain.  Neurological: Negative.  Negative for headaches.  Psychiatric/Behavioral: Negative.       Today's Vitals   04/04/22 0954  BP: 112/60  Pulse: 87  Temp: 98.1 F (36.7 C)  Weight: 191 lb 12.8 oz (87 kg)  Height: 5' 6.8" (1.697 m)  PainSc: 0-No pain   Body mass index is 30.22 kg/m.  Wt Readings from Last 3 Encounters:  04/04/22 191 lb 12.8 oz (87 kg)  11/27/21 203 lb 9.6 oz (92.4 kg)  10/04/21 207 lb 6.4 oz (94.1 kg)     Objective:  Physical Exam Vitals and nursing note reviewed.  Constitutional:      Appearance: Normal appearance.  HENT:     Head: Normocephalic and atraumatic.  Eyes:     Extraocular Movements: Extraocular movements intact.  Cardiovascular:     Rate and Rhythm: Normal rate and regular rhythm.     Heart sounds: Normal heart sounds.  Pulmonary:     Effort: Pulmonary effort is normal.     Breath sounds: Normal breath sounds.  Musculoskeletal:      Cervical back: Normal range of motion.  Skin:    General: Skin is warm.  Neurological:     General: No focal deficit present.     Mental Status: She is alert.  Psychiatric:        Mood and Affect: Mood normal.        Behavior: Behavior normal.      Assessment And Plan:     1. Type 2 diabetes mellitus with hyperlipidemia (HCC) Comments: Chronic, I will check labs as below. I will adjust meds as needed. She is not on ACE/ARB therapy due to lisinopril allergy.  - Microalbumin / Creatinine Urine Ratio - CMP14+EGFR - Hemoglobin A1c - POCT Urinalysis Dipstick (81002)  2. Essential hypertension, benign Comments: Chronic, well cotnrolled. She will c/w amlodipine 2.28m daily.   3. Vertigo Comments: While in the lab, pt pulled me  aside to state she forgot to mention she has vertigo. Dizziness seems to be getting worse. Wants to see ENT - Ambulatory referral to ENT  4. Chronic left-sided low back pain with left-sided sciatica Comments: She is encouraged to perform stretching exercises daily. I will refill Lidocaine patches for her to use prn.  - lidocaine (LIDODERM) 5 %; Remove & Discard patch within 12 hours or as directed by MD  Dispense: 90 patch; Refill: 2  5. Herpes zoster vaccination declined  6. Class 1 obesity due to excess calories with serious comorbidity and body mass index (BMI) of 30.0 to 30.9 in adult Comments: She is encouraged to aim for at least 150 minutes of exercise per week.    Patient was given opportunity to ask questions. Patient verbalized understanding of the plan and was able to repeat key elements of the plan. All questions were answered to their satisfaction.   I, Jasmine Greenland, MD, have reviewed all documentation for this visit. The documentation on 04/04/22 for the exam, diagnosis, procedures, and orders are all accurate and complete.  IF YOU HAVE BEEN REFERRED TO A SPECIALIST, IT MAY TAKE 1-2 WEEKS TO SCHEDULE/PROCESS THE REFERRAL. IF YOU HAVE NOT HEARD  FROM US/SPECIALIST IN TWO WEEKS, PLEASE GIVE [place] A CALL AT [phone].   THE PATIENT IS ENCOURAGED TO PRACTICE SOCIAL DISTANCING DUE TO THE COVID-19 PANDEMIC.

## 2022-04-05 LAB — CMP14+EGFR
ALT: 15 IU/L (ref 0–32)
AST: 17 IU/L (ref 0–40)
Albumin/Globulin Ratio: 1.6 (ref 1.2–2.2)
Albumin: 4.5 g/dL (ref 3.8–4.8)
Alkaline Phosphatase: 86 IU/L (ref 44–121)
BUN/Creatinine Ratio: 15 (ref 12–28)
BUN: 13 mg/dL (ref 8–27)
Bilirubin Total: 0.4 mg/dL (ref 0.0–1.2)
CO2: 26 mmol/L (ref 20–29)
Calcium: 10.2 mg/dL (ref 8.7–10.3)
Chloride: 105 mmol/L (ref 96–106)
Creatinine, Ser: 0.88 mg/dL (ref 0.57–1.00)
Globulin, Total: 2.9 g/dL (ref 1.5–4.5)
Glucose: 109 mg/dL — ABNORMAL HIGH (ref 70–99)
Potassium: 4.1 mmol/L (ref 3.5–5.2)
Sodium: 144 mmol/L (ref 134–144)
Total Protein: 7.4 g/dL (ref 6.0–8.5)
eGFR: 74 mL/min/{1.73_m2} (ref >59)

## 2022-04-05 LAB — HEMOGLOBIN A1C
Est. average glucose Bld gHb Est-mCnc: 131 mg/dL
Hgb A1c MFr Bld: 6.2 % — ABNORMAL HIGH (ref 4.8–5.6)

## 2022-04-05 LAB — MICROALBUMIN / CREATININE URINE RATIO
Creatinine, Urine: 226.3 mg/dL
Microalb/Creat Ratio: 9 mg/g creat (ref 0–29)
Microalbumin, Urine: 19.5 ug/mL

## 2022-04-12 ENCOUNTER — Telehealth: Payer: Self-pay

## 2022-04-25 ENCOUNTER — Other Ambulatory Visit: Payer: Self-pay | Admitting: Nurse Practitioner

## 2022-04-25 DIAGNOSIS — R0789 Other chest pain: Secondary | ICD-10-CM

## 2022-05-09 ENCOUNTER — Other Ambulatory Visit: Payer: Self-pay | Admitting: Nurse Practitioner

## 2022-05-14 ENCOUNTER — Other Ambulatory Visit: Payer: Self-pay | Admitting: Nurse Practitioner

## 2022-05-14 ENCOUNTER — Other Ambulatory Visit: Payer: Self-pay | Admitting: Internal Medicine

## 2022-05-14 DIAGNOSIS — M791 Myalgia, unspecified site: Secondary | ICD-10-CM

## 2022-05-17 ENCOUNTER — Other Ambulatory Visit: Payer: Self-pay | Admitting: Internal Medicine

## 2022-05-17 DIAGNOSIS — R0789 Other chest pain: Secondary | ICD-10-CM

## 2022-05-21 ENCOUNTER — Encounter: Payer: 59 | Admitting: Registered Nurse

## 2022-05-23 ENCOUNTER — Encounter: Payer: Self-pay | Admitting: Registered Nurse

## 2022-05-23 ENCOUNTER — Encounter: Payer: 59 | Attending: Registered Nurse | Admitting: Registered Nurse

## 2022-05-23 VITALS — BP 130/74 | HR 99 | Ht 66.8 in | Wt 187.0 lb

## 2022-05-23 DIAGNOSIS — R413 Other amnesia: Secondary | ICD-10-CM | POA: Diagnosis present

## 2022-05-23 DIAGNOSIS — M545 Low back pain, unspecified: Secondary | ICD-10-CM | POA: Diagnosis present

## 2022-05-23 DIAGNOSIS — G8929 Other chronic pain: Secondary | ICD-10-CM | POA: Insufficient documentation

## 2022-05-23 DIAGNOSIS — M542 Cervicalgia: Secondary | ICD-10-CM | POA: Insufficient documentation

## 2022-05-23 DIAGNOSIS — G5603 Carpal tunnel syndrome, bilateral upper limbs: Secondary | ICD-10-CM | POA: Diagnosis present

## 2022-05-23 NOTE — Progress Notes (Unsigned)
Subjective:    Patient ID: Jasmine Rodriguez, female    DOB: Apr 01, 1956, 66 y.o.   MRN: [id number]  HPI: Jasmine Rodriguez is a 66 y.o. female who returns for follow up appointment for chronic pain and medication refill. states *** pain is located in  ***. rates pain ***. current exercise regime is walking and performing stretching exercises.   Pain Inventory Average Pain 5 Pain Right Now 2 My pain is intermittent, sharp, burning, dull, tingling, and aching  In the last 24 hours, has pain interfered with the following? General activity 2 Relation with others 2 Enjoyment of life 0 What TIME of day is your pain at its worst? varies Sleep (in general) Good  Pain is worse with: bending, sitting, and some activites Pain improves with: rest, heat/ice, and medication Relief from Meds: 7  Family History  Problem Relation Age of Onset   Hypertension Mother    Arthritis Mother    Congestive Heart Failure Mother    Hypertension Father    Head & neck cancer Father    Social History   Socioeconomic History   Marital status: Divorced    Spouse name: Not on file   Number of children: Not on file   Years of education: Not on file   Highest education level: Not on file  Occupational History   Not on file  Tobacco Use   Smoking status: Never   Smokeless tobacco: Never  Vaping Use   Vaping Use: Never used  Substance and Sexual Activity   Alcohol use: No   Drug use: No   Sexual activity: Not on file  Other Topics Concern   Not on file  Social History Narrative   Not on file   Social Determinants of Health   Financial Resource Strain: Not on file  Food Insecurity: Not on file  Transportation Needs: Not on file  Physical Activity: Not on file  Stress: Not on file  Social Connections: Not on file   Past Surgical History:  Procedure Laterality Date   BREAST REDUCTION SURGERY     CESAREAN SECTION     KNEE SURGERY     TUBAL LIGATION     Past Surgical History:  Procedure  Laterality Date   BREAST REDUCTION SURGERY     CESAREAN SECTION     KNEE SURGERY     TUBAL LIGATION     Past Medical History:  Diagnosis Date   DDD (degenerative disc disease)    DM (diabetes mellitus) (HCC)    GERD (gastroesophageal reflux disease)    High cholesterol    HTN (hypertension)    Ht 5' 6.8" (1.697 m)   Wt 187 lb (84.8 kg)   BMI 29.46 kg/m   Opioid Risk Score:   Fall Risk Score:  `1  Depression screen [facility] 2/9     11/27/2021    9:03 AM 05/25/2021    9:24 AM 10/13/2020    1:31 PM 10/15/2019    9:38 AM 09/29/2019   12:35 PM 06/11/2019    9:40 AM 11/03/2018   11:58 AM  Depression screen PHQ 2/9  Decreased Interest 1 1 0 2 0 0 0  Down, Depressed, Hopeless 1 1 0 2 0 0 0  PHQ - 2 Score 2 2 0 4 0 0 0  Altered sleeping   0 0     Tired, decreased energy   1 3     Change in appetite   0 2  Feeling bad or failure about yourself    0 0     Trouble concentrating   0 0     Moving slowly or fidgety/restless   0 0     Suicidal thoughts   0 0     PHQ-9 Score   1 9     Difficult doing work/chores   Not difficult at all Somewhat difficult         Review of Systems  Musculoskeletal:  Positive for back pain.       Neck pain, bilateral shoulder, hand, foot pain, and knee pain      Objective:   Physical Exam        Assessment & Plan:  1.Cervicalgia: No Complaints today. Continue HEP as Tolerated. Continue to Monitor. 11/27/2021 2. Left Shoulder Tendonitis:RX: Medrol Dose Pak . Continue to Alternate Ice and Heat Therapy. Continue to Monitor. 11/27/2021 3. Left Wrist Pain: RX: X-ray. Continue to Monitor. 4. Neuropathic Pain: Right Hand: Continue current medication Regimen. Continue to monitor. 11/27/2021 4. Left Knee Pain: No complaints today. Continue to Monitor. 11/27/2021 5. Chronic Pain Syndrome: Continue Tramadol. PCP Prescribing. Continue to Monitor. 11/27/2021   F/U in 6 months

## 2022-05-23 NOTE — Patient Instructions (Addendum)
If you don't hear from Dr Epimenio Foot office in a week, give [place] a call.  [phone]

## 2022-05-24 ENCOUNTER — Encounter: Payer: Self-pay | Admitting: Registered Nurse

## 2022-05-31 ENCOUNTER — Encounter: Payer: Self-pay | Admitting: Internal Medicine

## 2022-05-31 ENCOUNTER — Other Ambulatory Visit: Payer: Self-pay

## 2022-05-31 MED ORDER — MICROLET LANCETS MISC: 2 refills | Status: AC

## 2022-05-31 MED ORDER — CONTOUR NEXT TEST VI STRP: ORAL_STRIP | 12 refills | Status: DC

## 2022-06-12 ENCOUNTER — Encounter: Payer: Self-pay | Admitting: Neurology

## 2022-06-12 ENCOUNTER — Telehealth: Payer: Self-pay | Admitting: Neurology

## 2022-06-12 ENCOUNTER — Ambulatory Visit: Payer: Commercial Managed Care - HMO | Admitting: Neurology

## 2022-06-12 VITALS — BP 149/100 | HR 93 | Ht 68.0 in | Wt 180.0 lb

## 2022-06-12 DIAGNOSIS — M5412 Radiculopathy, cervical region: Secondary | ICD-10-CM | POA: Diagnosis not present

## 2022-06-12 DIAGNOSIS — R413 Other amnesia: Secondary | ICD-10-CM

## 2022-06-12 DIAGNOSIS — R200 Anesthesia of skin: Secondary | ICD-10-CM | POA: Diagnosis not present

## 2022-06-12 DIAGNOSIS — E559 Vitamin D deficiency, unspecified: Secondary | ICD-10-CM | POA: Diagnosis not present

## 2022-06-12 NOTE — Progress Notes (Signed)
[facility]  PATIENT: Jasmine Rodriguez DOB: [birth year]  REFERRING DOCTOR OR PCP: Jacalyn Lefevre, NP; Velna Hatchet SOURCE: Patient, notes from primary care, imaging reports, lab reports, MRI images.  _________________________________   HISTORICAL  CHIEF COMPLAINT:  Chief Complaint  Patient presents with   Neurologic Problem    Pt alone, rm 1. Here today for a memory changes she is concerned about having ? Pinch nerve. Difficulty with short term memory. She has a lot of stress factors mom is sick and that hasnt helped     HISTORY OF PRESENT ILLNESS:  I had the pleasure seeing the patient, the DC of Jasmine Rodriguez, at [facility] for neurologic consultation regarding her memory changes and numbness in the hands and feet..  She is a 66 year old woman who has noted more issues with cognitive function over the past 6 to 9 months..  Her main problem has been forgetfulness.   She notes difficulties with her memory function more than other people do.  She is living alone independently and has not gotten lost while driving.  She denies difficulty with planning, following instructions or processing information    No difficulty with language.    When she does not recall something, often, with hints she will remember what she forgot.    She notes stress with her mother's health issues (she is 45 and has dementia and infections).    She denies depression and notes just mild anxiety.   She sleeps well most nights.     She does not think she has much snoring and has ever woken up gasping.     She lost all 5 points on the MoCA for short term recall and was 25/25 on the rest of the test.     15 minutes later, she got 3/5 with category prompt and one more with multiple choice prompt.            06/12/2022    8:32 AM  Montreal Cognitive Assessment   Visuospatial/ Executive (0/5) 5  Naming (0/3) 3  Attention: Read list of digits (0/2) 2  Attention: Read list of  letters (0/1) 1  Attention: Serial 7 subtraction starting at 100 (0/3) 3  Language: Repeat phrase (0/2) 2  Language : Fluency (0/1) 1  Abstraction (0/2) 2  Delayed Recall (0/5) 0  Orientation (0/6) 6  Total 25   She has numbness in her hands bilaterally.  This has worsened over the last few months.  She sometimes notes weakness in the hands..     She also has dysesthesias like walking on cracked glass in her feet..  The numbness in the feet is less bothersome than the numbness in the hands.  She also has L4L5 and L5S1 DJD/DDD seen on MRI 05/06/2004.  She had ESIs with benefit many years ago.  She continues to have some back pain.     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 2.  A couple muscles tested showed mild chronic denervation.  This could be due to a mild right C5 and mild right chronic C7 radiculopathy.  There was no acute denervation noted.   Changes at C5 mildly progressed compared to the previous study C7 is unchanged.  Vascular risks:  HTN, NIDDM, no smoking  REVIEW OF SYSTEMS: Constitutional: No fevers, chills, sweats, or change in appetite Eyes: No visual changes, double vision, eye pain Ear, nose and  throat: No hearing loss, ear pain, nasal congestion, sore throat Cardiovascular: No chest pain, palpitations Respiratory:  No shortness of breath at rest or with exertion.   No wheezes GastrointestinaI: No nausea, vomiting, diarrhea, abdominal pain, fecal incontinence Genitourinary:  No dysuria, urinary retention or frequency.  No nocturia. Musculoskeletal:  No neck pain, back pain Integumentary: No rash, pruritus, skin lesions Neurological: as above Psychiatric: No depression at this time.  No anxiety Endocrine: No palpitations, diaphoresis, change in appetite, change in weigh or increased thirst Hematologic/Lymphatic:  No anemia, purpura, petechiae. Allergic/Immunologic: No  itchy/runny eyes, nasal congestion, recent allergic reactions, rashes  ALLERGIES: Allergies  Allergen Reactions   Skelaxin [Metaxalone] Anaphylaxis, Itching and Rash   Lisinopril     HOME MEDICATIONS:  Current Outpatient Medications:    Accu-Chek FastClix Lancets MISC, USE AS DIRECTED TO CHECK BLOOD SUGARS 2 TIMES PER DAY., Disp: 102 each, Rfl: 0   ACCU-CHEK SMARTVIEW test strip, USE AS INSTRUCTED TO CHECK BLOOD SUGARS DAILY DX: E11.22, Disp: 50 strip, Rfl: 2   acetaminophen (TYLENOL) 500 MG tablet, Take 500 mg by mouth every 6 (six) hours as needed., Disp: , Rfl:    albuterol (PROAIR HFA) 108 (90 Base) MCG/ACT inhaler, Inhale 2 puffs into the lungs every 6 (six) hours as needed for wheezing or shortness of breath., Disp: 18 g, Rfl: 3   amLODipine (NORVASC) 2.5 MG tablet, TAKE 1 TABLET BY MOUTH EVERY DAY, Disp: 90 tablet, Rfl: 1   Calcium Carbonate (CALTRATE 600 PO), Take by mouth. daily, Disp: , Rfl:    cholecalciferol (VITAMIN D) 1000 UNITS tablet, Take 2,000 Units by mouth daily., Disp: , Rfl:    cyclobenzaprine (FLEXERIL) 10 MG tablet, TAKE 1 TABLET BY MOUTH AT BEDTIME AS NEEDED FOR MUSCLE SPASMS., Disp: 30 tablet, Rfl: 1   fluocinonide (LIDEX) 0.05 % external solution, APPLY 1 ML TO THE AFFECTED AREA NIGHTLY prn, Disp: 60 mL, Rfl: 3   glucosamine-chondroitin 500-400 MG tablet, Take 1 tablet by mouth 2 (two) times daily. , Disp: , Rfl:    glucose blood (CONTOUR NEXT TEST) test strip, Use as instructed to check blood sugars daily E11.69, Disp: 100 each, Rfl: 12   lactase (LACTAID) 3000 UNITS tablet, Take 1 tablet by mouth 3 (three) times daily as needed. Dairy consumption, Disp: , Rfl:    lidocaine (LIDODERM) 5 %, Remove & Discard patch within 12 hours or as directed by MD, Disp: 90 patch, Rfl: 2   Microlet Lancets MISC, Use as directed to check blood sugars E11.69, Disp: 100 each, Rfl: 2   nystatin (MYCOSTATIN/NYSTOP) powder, USE AS DIRECTED, Disp: 60 g, Rfl: 1   omeprazole (PRILOSEC)  20 MG capsule, TAKE 1 CAPSULE BY MOUTH EVERY DAY, Disp: 30 capsule, Rfl: 1   pravastatin (PRAVACHOL) 40 MG tablet, TAKE 1 TABLET BY MOUTH EVERY DAY IN THE EVENING, Disp: 90 tablet, Rfl: 2   sitaGLIPtin-metformin (JANUMET) 50-500 MG tablet, TAKE 1 TABLET BY MOUTH EVERY DAY, Disp: 30 tablet, Rfl: 2   traMADol (ULTRAM) 50 MG tablet, Take 1 tablet (50 mg total) by mouth every 8 (eight) hours as needed., Disp: 40 tablet, Rfl: 1   valACYclovir (VALTREX) 500 MG tablet, TAKE 1 TABLET BY MOUTH EVERY DAY, Disp: 90 tablet, Rfl: 1  PAST MEDICAL HISTORY: Past Medical History:  Diagnosis Date   DDD (degenerative disc disease)    DM (diabetes mellitus) (HCC)    GERD (gastroesophageal reflux disease)    High cholesterol    HTN (hypertension)  PAST SURGICAL HISTORY: Past Surgical History:  Procedure Laterality Date   BREAST REDUCTION SURGERY     CESAREAN SECTION     KNEE SURGERY     TUBAL LIGATION      FAMILY HISTORY: Family History  Problem Relation Age of Onset   Hypertension Mother    Arthritis Mother    Congestive Heart Failure Mother    Hypertension Father    Head & neck cancer Father     SOCIAL HISTORY: Social History   Socioeconomic History   Marital status: Divorced    Spouse name: Not on file   Number of children: Not on file   Years of education: Not on file   Highest education level: Not on file  Occupational History   Not on file  Tobacco Use   Smoking status: Never   Smokeless tobacco: Never  Vaping Use   Vaping Use: Never used  Substance and Sexual Activity   Alcohol use: No   Drug use: No   Sexual activity: Not on file  Other Topics Concern   Not on file  Social History Narrative   Not on file   Social Determinants of Health   Financial Resource Strain: Not on file  Food Insecurity: Not on file  Transportation Needs: Not on file  Physical Activity: Not on file  Stress: Not on file  Social Connections: Not on file  Intimate Partner Violence: Not on  file       PHYSICAL EXAM  Vitals:   06/12/22 0824  BP: (!) 149/100  Pulse: 93  Weight: 180 lb (81.6 kg)  Height: 5\' 8"  (1.727 m)     Body mass index is 27.37 kg/m.   General: The patient is well-developed and well-nourished and in no acute distress  HEENT:  Head is [place]/AT.  Sclera are anicteric.     Neck: No carotid bruits are noted.  The neck is nontender.  Cardiovascular: The heart has a regular rate and rhythm with a normal S1 and S2. There were no murmurs, gallops or rubs.    Skin: Extremities are without rash or  edema.  Musculoskeletal:  Back is nontender  Neurologic Exam  Mental status: The patient is alert and oriented x 3 at the time of the examination. The patient has apparent normal remote memory, but reduced STM (0/5 spontaneous, 3/5 with category prompt) .  She has normal attention span and concentration ability.   Speech is normal.  Cranial nerves: Extraocular movements are full. Pupils are equal, round, and reactive to light and accomodation.   Facial symmetry is present. There is good facial sensation to soft touch bilaterally.Facial strength is normal.  Trapezius and sternocleidomastoid strength is normal. No dysarthria is noted.  The tongue is midline, and the patient has symmetric elevation of the soft palate. No obvious hearing deficits are noted.  Motor:  Muscle bulk is normal.   Tone is normal. Strength is  5 / 5 in all 4 extremities.   Sensory: Sensory testing is intact to pinprick, soft touch and vibration sensation in arms but reduced vibration at toes (30%).  Coordination: Cerebellar testing reveals good finger-nose-finger and heel-to-shin bilaterally.  Gait and station: Station is normal.   Gait is slightly wide. Tandem gait is wide. Romberg is negative.   Reflexes: Deep tendon reflexes are symmetric and 1+ bilaterally.   Plantar responses are flexor.    DIAGNOSTIC DATA (LABS, IMAGING, TESTING) - I reviewed patient records, labs, notes,  testing and imaging myself where available.  Lab Results  Component Value Date   WBC 8.7 10/04/2021   HGB 12.8 10/04/2021   HCT 37.8 10/04/2021   MCV 84 10/04/2021   PLT 243 10/04/2021      Component Value Date/Time   NA 144 04/04/2022 1034   K 4.1 04/04/2022 1034   CL 105 04/04/2022 1034   CO2 26 04/04/2022 1034   GLUCOSE 109 (H) 04/04/2022 1034   BUN 13 04/04/2022 1034   CREATININE 0.88 04/04/2022 1034   CALCIUM 10.2 04/04/2022 1034   PROT 7.4 04/04/2022 1034   ALBUMIN 4.5 04/04/2022 1034   AST 17 04/04/2022 1034   ALT 15 04/04/2022 1034   ALKPHOS 86 04/04/2022 1034   BILITOT 0.4 04/04/2022 1034   GFRNONAA 67 06/21/2020 1555   GFRAA 77 06/21/2020 1555   Lab Results  Component Value Date   CHOL 193 10/04/2021   HDL 69 10/04/2021   LDLCALC 115 (H) 10/04/2021   TRIG 50 10/04/2021   CHOLHDL 2.8 10/04/2021   Lab Results  Component Value Date   HGBA1C 6.2 (H) 04/04/2022   Lab Results  Component Value Date   VITAMINB12 748 11/03/2018   Lab Results  Component Value Date   TSH 1.400 02/18/2020       ASSESSMENT AND PLAN  Memory loss - Plan: TSH, Vitamin B12, MR BRAIN WO CONTRAST  Numbness - Plan: MR CERVICAL SPINE WO CONTRAST  Cervical radiculopathy - Plan: MR CERVICAL SPINE WO CONTRAST  Vitamin D deficiency - Plan: VITAMIN D 25 Hydroxy (Vit-D Deficiency, Fractures)   In summary, Jasmine Rodriguez is a 66 year old woman with memory loss and numbness.  She scored 25/30 on the [facility] cognitive assessment (26 or higher is normal so she follows in the mild cognitive impairment range).  She lost all 5 points for memory and other cognitive tasks were performed well.  I discussed with her that often when a person in their 14s has difficulty with memory but does well with other cognitive tasks, the problem is more with focus/attention rather than with storage.  However, because she falls in the mild cognitive impairment stage, I would like to check some blood work including  B12 and TSH.  Additionally we will check an MRI of the brain to determine if there is medial temporal lobe atrophy as would be seen with early Alzheimer's disease or other changes.  She is noting more numbness and tingling in the hands.  Although she has known carpal tunnel syndrome on the right, she also had evidence of radiculopathy on NCV/EMG study in 2020.  Therefore, to better assess the cause of her symptoms we will check an MRI of the cervical spine.  If symptoms persist and the MRI does not show nerve root impingement, consider NCV/EMG testing.  Thank you for asking me to see Jasmine Rodriguez.  I will see her back in a few months or sooner if she has new or worsening neurologic symptoms..  Please let me know if I can be of further assistance with her or other patients in the future.  Jasmine Rodriguez A. Epimenio Foot, MD, [facility] 06/12/2022, 8:46 AM Certified in Neurology, Clinical Neurophysiology, Sleep Medicine and Neuroimaging  [facility] [address], [address] [place], [place] [address] [phone]

## 2022-06-12 NOTE — Telephone Encounter (Signed)
[employer] sent to GI they obtain auth  

## 2022-06-13 LAB — TSH: TSH: 1.14 u[IU]/mL (ref 0.450–4.500)

## 2022-06-13 LAB — VITAMIN D 25 HYDROXY (VIT D DEFICIENCY, FRACTURES): Vit D, 25-Hydroxy: 78.1 ng/mL (ref 30.0–100.0)

## 2022-06-13 LAB — VITAMIN B12: Vitamin B-12: 1426 pg/mL — ABNORMAL HIGH (ref 232–1245)

## 2022-06-14 ENCOUNTER — Other Ambulatory Visit: Payer: Self-pay | Admitting: Internal Medicine

## 2022-06-14 DIAGNOSIS — R0789 Other chest pain: Secondary | ICD-10-CM

## 2022-07-06 ENCOUNTER — Other Ambulatory Visit: Payer: Self-pay | Admitting: Internal Medicine

## 2022-07-06 DIAGNOSIS — R0789 Other chest pain: Secondary | ICD-10-CM

## 2022-07-09 ENCOUNTER — Other Ambulatory Visit: Payer: Self-pay

## 2022-07-17 ENCOUNTER — Other Ambulatory Visit: Payer: Self-pay

## 2022-08-07 ENCOUNTER — Other Ambulatory Visit: Payer: Self-pay | Admitting: Internal Medicine

## 2022-08-17 ENCOUNTER — Other Ambulatory Visit: Payer: Self-pay | Admitting: Internal Medicine

## 2022-08-22 ENCOUNTER — Other Ambulatory Visit: Payer: Self-pay | Admitting: Internal Medicine

## 2022-08-31 ENCOUNTER — Other Ambulatory Visit: Payer: Self-pay | Admitting: Nurse Practitioner

## 2022-08-31 DIAGNOSIS — M791 Myalgia, unspecified site: Secondary | ICD-10-CM

## 2022-10-09 ENCOUNTER — Encounter: Payer: Self-pay | Admitting: Internal Medicine

## 2022-10-09 ENCOUNTER — Ambulatory Visit (INDEPENDENT_AMBULATORY_CARE_PROVIDER_SITE_OTHER): Payer: Self-pay | Admitting: Internal Medicine

## 2022-10-09 VITALS — BP 122/84 | HR 103 | Temp 98.2°F | Ht 66.6 in | Wt 175.2 lb

## 2022-10-09 DIAGNOSIS — Z2821 Immunization not carried out because of patient refusal: Secondary | ICD-10-CM

## 2022-10-09 DIAGNOSIS — Z6827 Body mass index (BMI) 27.0-27.9, adult: Secondary | ICD-10-CM

## 2022-10-09 DIAGNOSIS — I10 Essential (primary) hypertension: Secondary | ICD-10-CM

## 2022-10-09 DIAGNOSIS — E785 Hyperlipidemia, unspecified: Secondary | ICD-10-CM

## 2022-10-09 DIAGNOSIS — E1169 Type 2 diabetes mellitus with other specified complication: Secondary | ICD-10-CM

## 2022-10-09 MED ORDER — AMLODIPINE BESYLATE 2.5 MG PO TABS: 2.5000 mg | ORAL_TABLET | Freq: Every day | ORAL | 1 refills | Status: DC

## 2022-10-09 NOTE — Progress Notes (Signed)
Jeri Cos [place],acting as a Neurosurgeon for [name], MD.,have documented all relevant documentation on the behalf of [name], MD,as directed by  [name], MD while in the presence of [name], MD.    Subjective:     Patient ID: Jasmine Rodriguez , female    DOB: 24-Apr-1956 , 67 y.o.   MRN: [id number]   Chief Complaint  Patient presents with   Hypertension    HPI  She presents today for HTN/DM check. She reports compliance with meds. She denies having headaches, chest pain and shortness of breath. She is happy to report she has been more active, she is now walking on a regular basis. She is pleased with her weight loss thus far.   Hypertension This is a chronic problem. The current episode started more than 1 year ago. The problem has been gradually improving since onset. The problem is controlled. Pertinent negatives include no blurred vision, chest pain or headaches. Risk factors for coronary artery disease include diabetes mellitus, post-menopausal state and sedentary lifestyle. Past treatments include calcium channel blockers. The current treatment provides moderate improvement.  Diabetes She presents for her follow-up diabetic visit. She has type 2 diabetes mellitus. Pertinent negatives for hypoglycemia include no headaches. Pertinent negatives for diabetes include no blurred vision, no chest pain, no polydipsia, no polyphagia and no polyuria. There are no hypoglycemic complications. Risk factors for coronary artery disease include diabetes mellitus, obesity, dyslipidemia, sedentary lifestyle and post-menopausal. She is compliant with treatment most of the time. She is following a diabetic diet. She participates in exercise intermittently. An ACE inhibitor/angiotensin II receptor blocker is not being taken.     Past Medical History:  Diagnosis Date   DDD (degenerative disc disease)    DM (diabetes mellitus) (HCC)    GERD (gastroesophageal reflux disease)     High cholesterol    HTN (hypertension)      Family History  Problem Relation Age of Onset   Hypertension Mother    Arthritis Mother    Congestive Heart Failure Mother    Hypertension Father    Head & neck cancer Father      Current Outpatient Medications:    Accu-Chek FastClix Lancets MISC, USE AS DIRECTED TO CHECK BLOOD SUGARS 2 TIMES PER DAY., Disp: 102 each, Rfl: 0   ACCU-CHEK SMARTVIEW test strip, USE AS INSTRUCTED TO CHECK BLOOD SUGARS DAILY DX: E11.22, Disp: 50 strip, Rfl: 2   acetaminophen (TYLENOL) 500 MG tablet, Take 500 mg by mouth every 6 (six) hours as needed., Disp: , Rfl:    albuterol (PROAIR HFA) 108 (90 Base) MCG/ACT inhaler, Inhale 2 puffs into the lungs every 6 (six) hours as needed for wheezing or shortness of breath., Disp: 18 g, Rfl: 3   Calcium Carbonate (CALTRATE 600 PO), Take by mouth. daily, Disp: , Rfl:    cholecalciferol (VITAMIN D) 1000 UNITS tablet, Take 2,000 Units by mouth daily., Disp: , Rfl:    cyclobenzaprine (FLEXERIL) 10 MG tablet, TAKE 1 TABLET BY MOUTH AT BEDTIME AS NEEDED FOR MUSCLE SPASMS., Disp: 30 tablet, Rfl: 1   fluocinonide (LIDEX) 0.05 % external solution, APPLY 1 ML TO THE AFFECTED AREA NIGHTLY prn, Disp: 60 mL, Rfl: 3   glucosamine-chondroitin 500-400 MG tablet, Take 1 tablet by mouth 2 (two) times daily. , Disp: , Rfl:    glucose blood (CONTOUR NEXT TEST) test strip, Use as instructed to check blood sugars daily E11.69, Disp: 100 each, Rfl: 12   lactase (  LACTAID) 3000 UNITS tablet, Take 1 tablet by mouth 3 (three) times daily as needed. Dairy consumption, Disp: , Rfl:    lidocaine (LIDODERM) 5 %, Remove & Discard patch within 12 hours or as directed by MD, Disp: 90 patch, Rfl: 2   Microlet Lancets MISC, Use as directed to check blood sugars E11.69, Disp: 100 each, Rfl: 2   nystatin (MYCOSTATIN/NYSTOP) powder, USE AS DIRECTED, Disp: 60 g, Rfl: 1   omeprazole (PRILOSEC) 20 MG capsule, TAKE 1 CAPSULE BY MOUTH EVERY DAY, Disp: 90 capsule,  Rfl: 1   pravastatin (PRAVACHOL) 40 MG tablet, TAKE 1 TABLET BY MOUTH EVERY DAY IN THE EVENING, Disp: 90 tablet, Rfl: 2   sitaGLIPtin-metformin (JANUMET) 50-500 MG tablet, TAKE 1 TABLET BY MOUTH EVERY DAY, Disp: 30 tablet, Rfl: 2   valACYclovir (VALTREX) 500 MG tablet, TAKE 1 TABLET BY MOUTH EVERY DAY, Disp: 90 tablet, Rfl: 1   amLODipine (NORVASC) 2.5 MG tablet, Take 1 tablet (2.5 mg total) by mouth daily., Disp: 90 tablet, Rfl: 1   Allergies  Allergen Reactions   Skelaxin [Metaxalone] Anaphylaxis, Itching and Rash   Lisinopril      Review of Systems  Constitutional: Negative.   Eyes: Negative.  Negative for blurred vision.  Respiratory: Negative.    Cardiovascular: Negative.  Negative for chest pain.  Gastrointestinal: Negative.   Endocrine: Negative for polydipsia, polyphagia and polyuria.  Musculoskeletal: Negative.   Skin: Negative.   Neurological: Negative.  Negative for headaches.  Psychiatric/Behavioral: Negative.       Today's Vitals   10/09/22 1052  BP: 122/84  Pulse: (!) 103  Temp: 98.2 F (36.8 C)  Weight: 175 lb 3.2 oz (79.5 kg)  Height: 5' 6.6" (1.692 m)  PainSc: 0-No pain   Body mass index is 27.77 kg/m.  Wt Readings from Last 3 Encounters:  10/09/22 175 lb 3.2 oz (79.5 kg)  06/12/22 180 lb (81.6 kg)  05/23/22 187 lb (84.8 kg)     Objective:  Physical Exam Vitals and nursing note reviewed.  Constitutional:      Appearance: Normal appearance.  HENT:     Head: Normocephalic and atraumatic.     Nose:     Comments: Masked     Mouth/Throat:     Comments: Masked  Eyes:     Extraocular Movements: Extraocular movements intact.  Cardiovascular:     Rate and Rhythm: Normal rate and regular rhythm.     Heart sounds: Normal heart sounds.  Pulmonary:     Effort: Pulmonary effort is normal.     Breath sounds: Normal breath sounds.  Musculoskeletal:     Cervical back: Normal range of motion.  Skin:    General: Skin is warm.  Neurological:      General: No focal deficit present.     Mental Status: She is alert.  Psychiatric:        Mood and Affect: Mood normal.        Behavior: Behavior normal.      Assessment And Plan:     1. Essential hypertension, benign Comments: Chronic, controlled. She will c/w amlodipine 2.5mg  daily. She is Ace-inhibitor intolerant. - CBC no Diff - amLODipine (NORVASC) 2.5 MG tablet; Take 1 tablet (2.5 mg total) by mouth daily.  Dispense: 90 tablet; Refill: 1 - CMP14+EGFR - Lipid panel  2. Type 2 diabetes mellitus with hyperlipidemia (HCC) Comments: Chronic, LDL goal <70. She is now taking Janumet 50/500mg  once daily. I will check labs as below. She will f/u in  23months for re-evaluation. - CBC no Diff - CMP14+EGFR - Hemoglobin A1c - Lipid panel  3. BMI 27.0-27.9,adult Comments: She is encouraged to aim for at least 150 minutes of exercise per week.  4. Herpes zoster vaccination declined   Patient was given opportunity to ask questions. Patient verbalized understanding of the plan and was able to repeat key elements of the plan. All questions were answered to their satisfaction.   I, [name], MD, have reviewed all documentation for this visit. The documentation on 10/09/22 for the exam, diagnosis, procedures, and orders are all accurate and complete.   IF YOU HAVE BEEN REFERRED TO A SPECIALIST, IT MAY TAKE 1-2 WEEKS TO SCHEDULE/PROCESS THE REFERRAL. IF YOU HAVE NOT HEARD FROM US/SPECIALIST IN TWO WEEKS, PLEASE GIVE [place] A CALL AT [phone].   THE PATIENT IS ENCOURAGED TO PRACTICE SOCIAL DISTANCING DUE TO THE COVID-19 PANDEMIC.

## 2022-10-09 NOTE — Telephone Encounter (Signed)
Chmg-error.  

## 2022-10-09 NOTE — Patient Instructions (Signed)

## 2022-10-10 LAB — CMP14+EGFR
ALT: 23 IU/L (ref 0–32)
AST: 24 IU/L (ref 0–40)
Albumin/Globulin Ratio: 1.9 (ref 1.2–2.2)
Albumin: 4.9 g/dL (ref 3.9–4.9)
Alkaline Phosphatase: 90 IU/L (ref 44–121)
BUN/Creatinine Ratio: 14 (ref 12–28)
BUN: 13 mg/dL (ref 8–27)
Bilirubin Total: 0.6 mg/dL (ref 0.0–1.2)
CO2: 24 mmol/L (ref 20–29)
Calcium: 10.1 mg/dL (ref 8.7–10.3)
Chloride: 99 mmol/L (ref 96–106)
Creatinine, Ser: 0.96 mg/dL (ref 0.57–1.00)
Globulin, Total: 2.6 g/dL (ref 1.5–4.5)
Glucose: 99 mg/dL (ref 70–99)
Potassium: 4.2 mmol/L (ref 3.5–5.2)
Sodium: 139 mmol/L (ref 134–144)
Total Protein: 7.5 g/dL (ref 6.0–8.5)
eGFR: 66 mL/min/{1.73_m2} (ref >59)

## 2022-10-10 LAB — CBC
Hematocrit: 40.8 % (ref 34.0–46.6)
Hemoglobin: 13.7 g/dL (ref 11.1–15.9)
MCH: 29.1 pg (ref 26.6–33.0)
MCHC: 33.6 g/dL (ref 31.5–35.7)
MCV: 87 fL (ref 79–97)
Platelets: 297 10*3/uL (ref 150–450)
RBC: 4.7 x10E6/uL (ref 3.77–5.28)
RDW: 13.1 % (ref 11.7–15.4)
WBC: 9.3 10*3/uL (ref 3.4–10.8)

## 2022-10-10 LAB — LIPID PANEL
Chol/HDL Ratio: 2.4 ratio (ref 0.0–4.4)
Cholesterol, Total: 196 mg/dL (ref 100–199)
HDL: 81 mg/dL (ref >39)
LDL Chol Calc (NIH): 106 mg/dL — ABNORMAL HIGH (ref 0–99)
Triglycerides: 49 mg/dL (ref 0–149)
VLDL Cholesterol Cal: 9 mg/dL (ref 5–40)

## 2022-10-10 LAB — HEMOGLOBIN A1C
Est. average glucose Bld gHb Est-mCnc: 126 mg/dL
Hgb A1c MFr Bld: 6 % — ABNORMAL HIGH (ref 4.8–5.6)

## 2022-11-16 ENCOUNTER — Other Ambulatory Visit: Payer: Self-pay | Admitting: Internal Medicine

## 2022-11-21 ENCOUNTER — Encounter: Payer: 59 | Admitting: Registered Nurse

## 2022-12-06 ENCOUNTER — Other Ambulatory Visit: Payer: Self-pay | Admitting: Nurse Practitioner

## 2022-12-06 DIAGNOSIS — M791 Myalgia, unspecified site: Secondary | ICD-10-CM

## 2022-12-20 ENCOUNTER — Other Ambulatory Visit: Payer: Self-pay | Admitting: Internal Medicine

## 2022-12-23 ENCOUNTER — Other Ambulatory Visit: Payer: Self-pay | Admitting: Internal Medicine

## 2022-12-23 DIAGNOSIS — R0789 Other chest pain: Secondary | ICD-10-CM

## 2023-01-03 ENCOUNTER — Other Ambulatory Visit: Payer: Self-pay

## 2023-01-10 DIAGNOSIS — Z6826 Body mass index (BMI) 26.0-26.9, adult: Secondary | ICD-10-CM | POA: Diagnosis not present

## 2023-01-10 DIAGNOSIS — Z01419 Encounter for gynecological examination (general) (routine) without abnormal findings: Secondary | ICD-10-CM | POA: Diagnosis not present

## 2023-01-10 DIAGNOSIS — Z1231 Encounter for screening mammogram for malignant neoplasm of breast: Secondary | ICD-10-CM | POA: Diagnosis not present

## 2023-01-10 DIAGNOSIS — N952 Postmenopausal atrophic vaginitis: Secondary | ICD-10-CM | POA: Diagnosis not present

## 2023-01-10 LAB — HM MAMMOGRAPHY

## 2023-01-16 ENCOUNTER — Encounter: Payer: 59 | Admitting: Internal Medicine

## 2023-01-24 DIAGNOSIS — N879 Dysplasia of cervix uteri, unspecified: Secondary | ICD-10-CM | POA: Diagnosis not present

## 2023-02-06 ENCOUNTER — Other Ambulatory Visit: Payer: Self-pay

## 2023-02-06 DIAGNOSIS — E1169 Type 2 diabetes mellitus with other specified complication: Secondary | ICD-10-CM

## 2023-02-06 MED ORDER — CONTOUR NEXT TEST VI STRP
ORAL_STRIP | 12 refills | Status: DC
Start: 2023-02-06 — End: 2023-06-07

## 2023-02-07 ENCOUNTER — Encounter: Payer: Self-pay | Admitting: Internal Medicine

## 2023-02-07 ENCOUNTER — Ambulatory Visit (INDEPENDENT_AMBULATORY_CARE_PROVIDER_SITE_OTHER): Payer: 59 | Admitting: Internal Medicine

## 2023-02-07 VITALS — BP 126/84 | HR 88 | Temp 98.0°F | Ht 66.0 in | Wt 168.4 lb

## 2023-02-07 DIAGNOSIS — I10 Essential (primary) hypertension: Secondary | ICD-10-CM | POA: Diagnosis not present

## 2023-02-07 DIAGNOSIS — E782 Mixed hyperlipidemia: Secondary | ICD-10-CM | POA: Diagnosis not present

## 2023-02-07 DIAGNOSIS — E663 Overweight: Secondary | ICD-10-CM | POA: Diagnosis not present

## 2023-02-07 DIAGNOSIS — E1169 Type 2 diabetes mellitus with other specified complication: Secondary | ICD-10-CM

## 2023-02-07 DIAGNOSIS — E785 Hyperlipidemia, unspecified: Secondary | ICD-10-CM | POA: Diagnosis not present

## 2023-02-07 DIAGNOSIS — Z6827 Body mass index (BMI) 27.0-27.9, adult: Secondary | ICD-10-CM

## 2023-02-07 LAB — BMP8+EGFR
BUN/Creatinine Ratio: 14 (ref 12–28)
BUN: 13 mg/dL (ref 8–27)
CO2: 25 mmol/L (ref 20–29)
Calcium: 10.2 mg/dL (ref 8.7–10.3)
Chloride: 102 mmol/L (ref 96–106)
Creatinine, Ser: 0.92 mg/dL (ref 0.57–1.00)
Glucose: 105 mg/dL — ABNORMAL HIGH (ref 70–99)
Potassium: 4.2 mmol/L (ref 3.5–5.2)
Sodium: 143 mmol/L (ref 134–144)
eGFR: 70 mL/min/{1.73_m2} (ref >59)

## 2023-02-07 LAB — HEMOGLOBIN A1C
Est. average glucose Bld gHb Est-mCnc: 123 mg/dL
Hgb A1c MFr Bld: 5.9 % — ABNORMAL HIGH (ref 4.8–5.6)

## 2023-02-07 NOTE — Patient Instructions (Signed)
Hypertension, Adult ?Hypertension is another name for high blood pressure. High blood pressure forces your heart to work harder to pump blood. This can cause problems over time. ?There are two numbers in a blood pressure reading. There is a top number (systolic) over a bottom number (diastolic). It is best to have a blood pressure that is below 120/80. ?What are the causes? ?The cause of this condition is not known. Some other conditions can lead to high blood pressure. ?What increases the risk? ?Some lifestyle factors can make you more likely to develop high blood pressure: ?Smoking. ?Not getting enough exercise or physical activity. ?Being overweight. ?Having too much fat, sugar, calories, or salt (sodium) in your diet. ?Drinking too much alcohol. ?Other risk factors include: ?Having any of these conditions: ?Heart disease. ?Diabetes. ?High cholesterol. ?Kidney disease. ?Obstructive sleep apnea. ?Having a family history of high blood pressure and high cholesterol. ?Age. The risk increases with age. ?Stress. ?What are the signs or symptoms? ?High blood pressure may not cause symptoms. Very high blood pressure (hypertensive crisis) may cause: ?Headache. ?Fast or uneven heartbeats (palpitations). ?Shortness of breath. ?Nosebleed. ?Vomiting or feeling like you may vomit (nauseous). ?Changes in how you see. ?Very bad chest pain. ?Feeling dizzy. ?Seizures. ?How is this treated? ?This condition is treated by making healthy lifestyle changes, such as: ?Eating healthy foods. ?Exercising more. ?Drinking less alcohol. ?Your doctor may prescribe medicine if lifestyle changes do not help enough and if: ?Your top number is above 130. ?Your bottom number is above 80. ?Your personal target blood pressure may vary. ?Follow these instructions at home: ?Eating and drinking ? ?If told, follow the DASH eating plan. To follow this plan: ?Fill one half of your plate at each meal with fruits and vegetables. ?Fill one fourth of your plate  at each meal with whole grains. Whole grains include whole-wheat pasta, brown rice, and whole-grain bread. ?Eat or drink low-fat dairy products, such as skim milk or low-fat yogurt. ?Fill one fourth of your plate at each meal with low-fat (lean) proteins. Low-fat proteins include fish, chicken without skin, eggs, beans, and tofu. ?Avoid fatty meat, cured and processed meat, or chicken with skin. ?Avoid pre-made or processed food. ?Limit the amount of salt in your diet to less than 1,500 mg each day. ?Do not drink alcohol if: ?Your doctor tells you not to drink. ?You are pregnant, may be pregnant, or are planning to become pregnant. ?If you drink alcohol: ?Limit how much you have to: ?0-1 drink a day for women. ?0-2 drinks a day for men. ?Know how much alcohol is in your drink. In the [place] one drink equals one 12 oz bottle of beer (355 mL), one 5 oz glass of wine (148 mL), or one 1? oz glass of hard liquor (44 mL). ?Lifestyle ? ?Work with your doctor to stay at a healthy weight or to lose weight. Ask your doctor what the best weight is for you. ?Get at least 30 minutes of exercise that causes your heart to beat faster (aerobic exercise) most days of the week. This may include walking, swimming, or biking. ?Get at least 30 minutes of exercise that strengthens your muscles (resistance exercise) at least 3 days a week. This may include lifting weights or doing Pilates. ?Do not smoke or use any products that contain nicotine or tobacco. If you need help quitting, ask your doctor. ?Check your blood pressure at home as told by your doctor. ?Keep all follow-up visits. ?Medicines ?Take over-the-counter and prescription medicines   only as told by your doctor. Follow directions carefully. ?Do not skip doses of blood pressure medicine. The medicine does not work as well if you skip doses. Skipping doses also puts you at risk for problems. ?Ask your doctor about side effects or reactions to medicines that you should watch  for. ?Contact a doctor if: ?You think you are having a reaction to the medicine you are taking. ?You have headaches that keep coming back. ?You feel dizzy. ?You have swelling in your ankles. ?You have trouble with your vision. ?Get help right away if: ?You get a very bad headache. ?You start to feel mixed up (confused). ?You feel weak or numb. ?You feel faint. ?You have very bad pain in your: ?Chest. ?Belly (abdomen). ?You vomit more than once. ?You have trouble breathing. ?These symptoms may be an emergency. Get help right away. Call 911. ?Do not wait to see if the symptoms will go away. ?Do not drive yourself to the hospital. ?Summary ?Hypertension is another name for high blood pressure. ?High blood pressure forces your heart to work harder to pump blood. ?For most people, a normal blood pressure is less than 120/80. ?Making healthy choices can help lower blood pressure. If your blood pressure does not get lower with healthy choices, you may need to take medicine. ?This information is not intended to replace advice given to you by your health care provider. Make sure you discuss any questions you have with your health care provider. ?Document Revised: 07/06/2021 Document Reviewed: 07/06/2021 ?Elsevier Patient Education ? 2023 [employer] ? ?

## 2023-02-07 NOTE — Progress Notes (Signed)
I,Victoria T Hamilton,acting as a scribe for [name], MD.,have documented all relevant documentation on the behalf of [name], MD,as directed by  [name], MD while in the presence of [name], MD. -   Subjective:     Patient ID: Jasmine Rodriguez , female    DOB: [birth year] , 67 y.o.   MRN: [id number]   Chief Complaint  Patient presents with   Hypertension   Diabetes   Hyperlipidemia    HPI  She presents today for HTN/DM check. She reports compliance with meds. She denies having headaches, chest pain and shortness of breath. She is happy to report she has been more active, she is now walking on a regular basis. She is pleased with her weight loss thus far.   She reports having an upcoming appt with ophthalmologist. She is due May 2024.   Hypertension This is a chronic problem. The current episode started more than 1 year ago. The problem has been gradually improving since onset. The problem is controlled. Pertinent negatives include no blurred vision, chest pain or headaches. Risk factors for coronary artery disease include diabetes mellitus, post-menopausal state and sedentary lifestyle. Past treatments include calcium channel blockers. The current treatment provides moderate improvement.  Diabetes She presents for her follow-up diabetic visit. She has type 2 diabetes mellitus. Pertinent negatives for hypoglycemia include no headaches. Pertinent negatives for diabetes include no blurred vision, no chest pain, no polydipsia, no polyphagia and no polyuria. There are no hypoglycemic complications. Risk factors for coronary artery disease include diabetes mellitus, obesity, dyslipidemia, sedentary lifestyle and post-menopausal. She is compliant with treatment most of the time. She is following a diabetic diet. She participates in exercise intermittently. An ACE inhibitor/angiotensin II receptor blocker is not being taken.  Hyperlipidemia This is a chronic problem.  The current episode started more than 1 year ago. Exacerbating diseases include diabetes. Pertinent negatives include no chest pain. The current treatment provides moderate improvement of lipids.     Past Medical History:  Diagnosis Date   DDD (degenerative disc disease)    DM (diabetes mellitus) (HCC)    GERD (gastroesophageal reflux disease)    High cholesterol    HTN (hypertension)      Family History  Problem Relation Age of Onset   Hypertension Mother    Arthritis Mother    Congestive Heart Failure Mother    Hypertension Father    Head & neck cancer Father      Current Outpatient Medications:    Accu-Chek FastClix Lancets MISC, USE AS DIRECTED TO CHECK BLOOD SUGARS 2 TIMES PER DAY., Disp: 102 each, Rfl: 0   acetaminophen (TYLENOL) 500 MG tablet, Take 500 mg by mouth every 6 (six) hours as needed., Disp: , Rfl:    albuterol (PROAIR HFA) 108 (90 Base) MCG/ACT inhaler, Inhale 2 puffs into the lungs every 6 (six) hours as needed for wheezing or shortness of breath., Disp: 18 g, Rfl: 3   amLODipine (NORVASC) 2.5 MG tablet, Take 1 tablet (2.5 mg total) by mouth daily., Disp: 90 tablet, Rfl: 1   Calcium Carbonate (CALTRATE 600 PO), Take by mouth. daily, Disp: , Rfl:    cholecalciferol (VITAMIN D) 1000 UNITS tablet, Take 2,000 Units by mouth daily., Disp: , Rfl:    cyclobenzaprine (FLEXERIL) 10 MG tablet, TAKE 1 TABLET BY MOUTH AT BEDTIME AS NEEDED FOR MUSCLE SPASMS., Disp: 30 tablet, Rfl: 1   fluocinonide (LIDEX) 0.05 % external solution, APPLY 1 ML TO THE  AFFECTED AREA NIGHTLY prn, Disp: 60 mL, Rfl: 3   glucosamine-chondroitin 500-400 MG tablet, Take 1 tablet by mouth 2 (two) times daily. , Disp: , Rfl:    glucose blood (CONTOUR NEXT TEST) test strip, Use as instructed to check blood sugars once daily E11.69, Disp: 100 each, Rfl: 12   lactase (LACTAID) 3000 UNITS tablet, Take 1 tablet by mouth 3 (three) times daily as needed. Dairy consumption, Disp: , Rfl:    lidocaine (LIDODERM) 5  %, Remove & Discard patch within 12 hours or as directed by MD, Disp: 90 patch, Rfl: 2   Microlet Lancets MISC, Use as directed to check blood sugars E11.69, Disp: 100 each, Rfl: 2   nystatin (MYCOSTATIN/NYSTOP) powder, USE AS DIRECTED, Disp: 60 g, Rfl: 1   omeprazole (PRILOSEC) 20 MG capsule, TAKE 1 CAPSULE BY MOUTH EVERY DAY, Disp: 90 capsule, Rfl: 2   pravastatin (PRAVACHOL) 40 MG tablet, TAKE 1 TABLET BY MOUTH EVERY DAY IN THE EVENING, Disp: 90 tablet, Rfl: 2   sitaGLIPtin-metformin (JANUMET) 50-500 MG tablet, TAKE 1 TABLET BY MOUTH TWICE A DAY, Disp: 180 tablet, Rfl: 2   valACYclovir (VALTREX) 500 MG tablet, TAKE 1 TABLET BY MOUTH EVERY DAY, Disp: 90 tablet, Rfl: 1   Allergies  Allergen Reactions   Skelaxin [Metaxalone] Anaphylaxis, Itching and Rash   Lisinopril      Review of Systems  Constitutional: Negative.   Eyes:  Negative for blurred vision.  Respiratory: Negative.    Cardiovascular: Negative.  Negative for chest pain.  Endocrine: Negative for polydipsia, polyphagia and polyuria.  Musculoskeletal: Negative.   Skin: Negative.   Neurological: Negative.  Negative for headaches.  Psychiatric/Behavioral: Negative.       Today's Vitals   02/07/23 0928  BP: 126/84  Pulse: 88  Temp: 98 F (36.7 C)  SpO2: 98%  Weight: 168 lb 6.4 oz (76.4 kg)  Height: 5\' 6"  (1.676 m)   Body mass index is 27.18 kg/m.  Wt Readings from Last 3 Encounters:  02/07/23 168 lb 6.4 oz (76.4 kg)  10/09/22 175 lb 3.2 oz (79.5 kg)  06/12/22 180 lb (81.6 kg)    Objective:  Physical Exam Vitals and nursing note reviewed.  Constitutional:      Appearance: Normal appearance.  HENT:     Head: Normocephalic and atraumatic.     Nose:     Comments: Masked     Mouth/Throat:     Comments: Masked  Eyes:     Extraocular Movements: Extraocular movements intact.  Cardiovascular:     Rate and Rhythm: Normal rate and regular rhythm.     Heart sounds: Normal heart sounds.  Pulmonary:     Effort:  Pulmonary effort is normal.     Breath sounds: Normal breath sounds.  Musculoskeletal:     Cervical back: Normal range of motion.  Skin:    General: Skin is warm.  Neurological:     General: No focal deficit present.     Mental Status: She is alert.  Psychiatric:        Mood and Affect: Mood normal.        Behavior: Behavior normal.         Assessment And Plan:     1. Essential hypertension, benign Comments: Chronic, fair control. Goal BP<120/80. She was congratulated on her regular exercise regimen. She will c/w amlodipine 2.5mg  daily.  2. Type 2 diabetes mellitus with hyperlipidemia (HCC) Comments: Chronic, LDL goal < 70. She will c/w pravastatin and Janumet. She  was congratulated on her lifestyle changes. - Hemoglobin A1c - BMP8+EGFR  3. Mixed hyperlipidemia Comments: Chronic, LDL goal < 70. She will c/w pravastatin.  4. Overweight with body mass index (BMI) of 27 to 27.9 in adult Comments: She was congratulated on her 7lb weight loss since January 2024. She is encouraged to aim for at least 150 minutes of exercise/week.     Patient was given opportunity to ask questions. Patient verbalized understanding of the plan and was able to repeat key elements of the plan. All questions were answered to their satisfaction.   I, [name], MD, have reviewed all documentation for this visit. The documentation on 02/07/23 for the exam, diagnosis, procedures, and orders are all accurate and complete.   IF YOU HAVE BEEN REFERRED TO A SPECIALIST, IT MAY TAKE 1-2 WEEKS TO SCHEDULE/PROCESS THE REFERRAL. IF YOU HAVE NOT HEARD FROM US/SPECIALIST IN TWO WEEKS, PLEASE GIVE [place] A CALL AT [phone] X [phone].   THE PATIENT IS ENCOURAGED TO PRACTICE SOCIAL DISTANCING DUE TO THE COVID-19 PANDEMIC.

## 2023-02-15 ENCOUNTER — Encounter: Payer: Self-pay | Admitting: Internal Medicine

## 2023-02-26 ENCOUNTER — Telehealth: Payer: Self-pay

## 2023-02-26 NOTE — Telephone Encounter (Signed)
PA for janument 50-500mg  has been submitted through covermymeds. We are waiting on the determination. YL,RMA

## 2023-03-04 ENCOUNTER — Encounter: Payer: Self-pay | Admitting: Internal Medicine

## 2023-03-04 ENCOUNTER — Other Ambulatory Visit: Payer: Self-pay

## 2023-03-04 MED ORDER — JANUMET 50-500 MG PO TABS: ORAL_TABLET | ORAL | 2 refills | Status: DC

## 2023-03-05 DIAGNOSIS — H40053 Ocular hypertension, bilateral: Secondary | ICD-10-CM | POA: Diagnosis not present

## 2023-03-05 DIAGNOSIS — H35033 Hypertensive retinopathy, bilateral: Secondary | ICD-10-CM | POA: Diagnosis not present

## 2023-03-05 DIAGNOSIS — E119 Type 2 diabetes mellitus without complications: Secondary | ICD-10-CM | POA: Diagnosis not present

## 2023-03-05 LAB — HM DIABETES EYE EXAM

## 2023-04-17 ENCOUNTER — Encounter: Payer: Self-pay | Admitting: Internal Medicine

## 2023-05-10 ENCOUNTER — Other Ambulatory Visit: Payer: Self-pay | Admitting: Internal Medicine

## 2023-05-10 DIAGNOSIS — I10 Essential (primary) hypertension: Secondary | ICD-10-CM

## 2023-05-17 ENCOUNTER — Other Ambulatory Visit: Payer: Self-pay | Admitting: Internal Medicine

## 2023-05-30 DIAGNOSIS — K5900 Constipation, unspecified: Secondary | ICD-10-CM | POA: Diagnosis not present

## 2023-05-30 DIAGNOSIS — Z86010 Personal history of colonic polyps: Secondary | ICD-10-CM | POA: Diagnosis not present

## 2023-05-30 DIAGNOSIS — K219 Gastro-esophageal reflux disease without esophagitis: Secondary | ICD-10-CM | POA: Diagnosis not present

## 2023-05-30 DIAGNOSIS — Z1211 Encounter for screening for malignant neoplasm of colon: Secondary | ICD-10-CM | POA: Diagnosis not present

## 2023-06-06 ENCOUNTER — Encounter: Payer: Self-pay | Admitting: Internal Medicine

## 2023-06-06 ENCOUNTER — Ambulatory Visit (INDEPENDENT_AMBULATORY_CARE_PROVIDER_SITE_OTHER): Payer: Medicare HMO | Admitting: Internal Medicine

## 2023-06-06 VITALS — BP 130/82 | HR 85 | Temp 97.9°F | Ht 66.0 in | Wt 169.0 lb

## 2023-06-06 DIAGNOSIS — E1169 Type 2 diabetes mellitus with other specified complication: Secondary | ICD-10-CM | POA: Diagnosis not present

## 2023-06-06 DIAGNOSIS — Z6826 Body mass index (BMI) 26.0-26.9, adult: Secondary | ICD-10-CM | POA: Insufficient documentation

## 2023-06-06 DIAGNOSIS — Z0000 Encounter for general adult medical examination without abnormal findings: Secondary | ICD-10-CM

## 2023-06-06 DIAGNOSIS — B351 Tinea unguium: Secondary | ICD-10-CM

## 2023-06-06 DIAGNOSIS — E785 Hyperlipidemia, unspecified: Secondary | ICD-10-CM | POA: Diagnosis not present

## 2023-06-06 DIAGNOSIS — I10 Essential (primary) hypertension: Secondary | ICD-10-CM | POA: Diagnosis not present

## 2023-06-06 DIAGNOSIS — Z6827 Body mass index (BMI) 27.0-27.9, adult: Secondary | ICD-10-CM | POA: Diagnosis not present

## 2023-06-06 NOTE — Patient Instructions (Signed)

## 2023-06-06 NOTE — Progress Notes (Signed)
Subjective:    Jasmine Rodriguez is a 67 y.o. female who presents for a Welcome to Medicare exam. She reports compliance with meds. She denies having any headaches, chest pain and shortness of breath.   She is here today for a full physical exam. She is followed by Gyn for her pelvic exams.She has her mammograms performed at his office as well. She reports compliance with meds.   Diabetes She presents for her follow-up diabetic visit. She has type 2 diabetes mellitus. There are no hypoglycemic associated symptoms. There are no diabetic associated symptoms. There are no hypoglycemic complications. Risk factors for coronary artery disease include diabetes mellitus, dyslipidemia, sedentary lifestyle, post-menopausal and obesity. Current diabetic treatment includes oral agent (dual therapy). She is compliant with treatment most of the time. She is following a diabetic diet. She never participates in exercise.     Review of Systems Review of Systems  Constitutional: Negative.   HENT: Negative.    Eyes: Negative.   Respiratory: Negative.    Cardiovascular: Negative.   Gastrointestinal: Negative.   Genitourinary: Negative.   Musculoskeletal: Negative.   Skin: Negative.   Neurological: Negative.   Endo/Heme/Allergies: Negative.   Psychiatric/Behavioral: Negative.      Cardiac risk factors: DM, HTN, postmenopausal, high cholesterol, ethnicity      Objective:    Today's Vitals   06/06/23 0950 06/06/23 1001  BP: (!) 140/82 130/82  Pulse: 85   Temp: 97.9 F (36.6 C)   SpO2: 99%   Weight: 169 lb (76.7 kg)   Height: 5\' 6"  (1.676 m)   Body mass index is 27.28 kg/m.  Medications Outpatient Encounter Medications as of 06/06/2023  Medication Sig   Accu-Chek FastClix Lancets MISC USE AS DIRECTED TO CHECK BLOOD SUGARS 2 TIMES PER DAY.   acetaminophen (TYLENOL) 500 MG tablet Take 500 mg by mouth every 6 (six) hours as needed.   amLODipine (NORVASC) 2.5 MG tablet TAKE 1 TABLET BY MOUTH  EVERY DAY   Calcium Carbonate (CALTRATE 600 PO) Take by mouth. daily   cholecalciferol (VITAMIN D) 1000 UNITS tablet Take 2,000 Units by mouth daily.   cyclobenzaprine (FLEXERIL) 10 MG tablet TAKE 1 TABLET BY MOUTH AT BEDTIME AS NEEDED FOR MUSCLE SPASMS.   fluocinonide (LIDEX) 0.05 % external solution APPLY 1 ML TO THE AFFECTED AREA NIGHTLY AS NEEDED   glucosamine-chondroitin 500-400 MG tablet Take 1 tablet by mouth 2 (two) times daily.    lactase (LACTAID) 3000 UNITS tablet Take 1 tablet by mouth 3 (three) times daily as needed. Dairy consumption   lidocaine (LIDODERM) 5 % Remove & Discard patch within 12 hours or as directed by MD   Microlet Lancets MISC Use as directed to check blood sugars E11.69   nystatin (MYCOSTATIN/NYSTOP) powder USE AS DIRECTED   omeprazole (PRILOSEC) 20 MG capsule TAKE 1 CAPSULE BY MOUTH EVERY DAY   sitaGLIPtin-metformin (JANUMET) 50-500 MG tablet TAKE 1 TABLET BY MOUTH TWICE A DAY   valACYclovir (VALTREX) 500 MG tablet TAKE 1 TABLET BY MOUTH EVERY DAY   [DISCONTINUED] glucose blood (CONTOUR NEXT TEST) test strip Use as instructed to check blood sugars once daily E11.69   [DISCONTINUED] pravastatin (PRAVACHOL) 40 MG tablet TAKE 1 TABLET BY MOUTH EVERY DAY IN THE EVENING   albuterol (PROAIR HFA) 108 (90 Base) MCG/ACT inhaler Inhale 2 puffs into the lungs every 6 (six) hours as needed for wheezing or shortness of breath. (Patient not taking: Reported on 06/06/2023)   No facility-administered encounter medications on file  as of 06/06/2023.     History: Past Medical History:  Diagnosis Date   DDD (degenerative disc disease)    DM (diabetes mellitus) (HCC)    GERD (gastroesophageal reflux disease)    High cholesterol    HTN (hypertension)    Past Surgical History:  Procedure Laterality Date   BREAST REDUCTION SURGERY     CESAREAN SECTION     KNEE SURGERY     TUBAL LIGATION      Family History  Problem Relation Age of Onset   Hypertension Mother    Arthritis  Mother    Congestive Heart Failure Mother    Hypertension Father    Head & neck cancer Father    Social History   Occupational History   Not on file  Tobacco Use   Smoking status: Never   Smokeless tobacco: Never  Vaping Use   Vaping status: Never Used  Substance and Sexual Activity   Alcohol use: No   Drug use: No   Sexual activity: Not on file    Tobacco Counseling Counseling given: Yes   Immunizations and Health Maintenance Immunization History  Administered Date(s) Administered   COVID-19, mRNA, vaccine(Comirnaty)12 years and older 02/19/2023   Influenza,inj,Quad PF,6+ Mos 06/11/2019   Influenza-Unspecified 07/17/2018, 07/04/2020, 07/24/2022   Moderna Covid Bivalent Peds Booster(22mo Thru 42yrs) 10/20/2021   PFIZER(Purple Top)SARS-COV-2 Vaccination 12/16/2019, 01/09/2020   Pfizer Covid-19 Vaccine Bivalent Booster 36yrs & up 07/24/2022   Tdap 10/15/2019   Health Maintenance Due  Topic Date Due   FOOT EXAM  06/21/2021   COVID-19 Vaccine (6 - 2023-24 season) 06/02/2023    Activities of Daily Living    06/06/2023    9:52 AM  In your present state of health, do you have any difficulty performing the following activities:  Hearing? 0  Vision? 0  Difficulty concentrating or making decisions? 0  Walking or climbing stairs? 0  Dressing or bathing? 0  Doing errands, shopping? 0  Preparing Food and eating ? N  Using the Toilet? N  In the past six months, have you accidently leaked urine? Y  Do you have problems with loss of bowel control? N  Managing your Medications? N  Managing your Finances? N  Housekeeping or managing your Housekeeping? N    Physical Exam   Physical Exam Vitals and nursing note reviewed.  Constitutional:      Appearance: Normal appearance.  HENT:     Head: Normocephalic and atraumatic.     Right Ear: Tympanic membrane, ear canal and external ear normal.     Left Ear: Tympanic membrane, ear canal and external ear normal.  Eyes:      Extraocular Movements: Extraocular movements intact.     Conjunctiva/sclera: Conjunctivae normal.     Pupils: Pupils are equal, round, and reactive to light.  Cardiovascular:     Rate and Rhythm: Normal rate and regular rhythm.     Pulses: Normal pulses.          Dorsalis pedis pulses are 2+ on the right side and 2+ on the left side.     Heart sounds: Normal heart sounds.  Pulmonary:     Effort: Pulmonary effort is normal.     Breath sounds: Normal breath sounds.  Chest:  Breasts:    Tanner Score is 5.     Right: Normal.     Left: Normal.  Abdominal:     General: Bowel sounds are normal.     Palpations: Abdomen is soft.  Musculoskeletal:  General: Normal range of motion.     Cervical back: Normal range of motion.  Feet:     Right foot:     Protective Sensation: 5 sites tested.  5 sites sensed.     Skin integrity: Callus present.     Toenail Condition: Right toenails are abnormally thick. Fungal disease present.    Left foot:     Protective Sensation: 5 sites tested.  5 sites sensed.     Skin integrity: Skin integrity normal.  Skin:    General: Skin is warm and dry.  Neurological:     General: No focal deficit present.     Mental Status: She is alert and oriented to person, place, and time.  Psychiatric:        Mood and Affect: Mood normal.        Behavior: Behavior normal.      Advanced Directives: Does Patient Have a Medical Advance Directive?: No Would patient like information on creating a medical advance directive?: Yes (MAU/Ambulatory/Procedural Areas - Information given)   . Assessment:    This is a routine wellness examination for this patient .   Vision/Hearing screen Hearing Screening   500Hz  1000Hz  2000Hz  4000Hz   Right ear Pass Pass Pass Pass  Left ear Pass Pass Pass Pass   Vision Screening   Right eye Left eye Both eyes  Without correction     With correction 20/30 20/30 20/20      Goals       DIET - INCREASE WATER INTAKE      Quality  Time With Family. (pt-stated)      She would like to spend more time with her grandchildren. While enjoying life.        Depression Screen    06/06/2023    9:47 AM 02/07/2023    9:29 AM 05/23/2022    1:39 PM 11/27/2021    9:03 AM  PHQ 2/9 Scores  PHQ - 2 Score 0 0 0 2  PHQ- 9 Score 0        Fall Risk    06/06/2023    9:47 AM  Fall Risk   Falls in the past year? 0  Number falls in past yr: 0  Injury with Fall? 0  Risk for fall due to : No Fall Risks  Follow up Falls evaluation completed    Cognitive Function:      06/12/2022    8:32 AM  Montreal Cognitive Assessment   Visuospatial/ Executive (0/5) 5  Naming (0/3) 3  Attention: Read list of digits (0/2) 2  Attention: Read list of letters (0/1) 1  Attention: Serial 7 subtraction starting at 100 (0/3) 3  Language: Repeat phrase (0/2) 2  Language : Fluency (0/1) 1  Abstraction (0/2) 2  Delayed Recall (0/5) 0  Orientation (0/6) 6  Total 25      06/06/2023    9:56 AM  6CIT Screen  What Year? 0 points  What month? 0 points  What time? 0 points  Count back from 20 0 points  Months in reverse 0 points  Repeat phrase 8 points  Total Score 8 points    Patient Care Team: Dorothyann Peng, MD as PCP - General (Internal Medicine) Freddy Finner, MD (Inactive) as Attending Physician (Obstetrics and Gynecology)     Plan:  Encounter for Medicare annual wellness exam Assessment & Plan: The annual wellness visit was performed including discussion of advanced directives, assessment of functional status and cognitive function. EKG performed, NSR w/  frequent ectopic ventricular beats.  A full exam was also performed. She will rto in one year for AWV with [facility] Advisor.  A full exam was performed.  Importance of monthly self breast exams was discussed with the patient.  She is advised to get 30-45 minutes of regular exercise, no less than four to five days per week. Both weight-bearing and aerobic exercises are recommended.  She is  advised to follow a healthy diet with at least six fruits/veggies per day, decrease intake of red meat and other saturated fats and to increase fish intake to twice weekly.  Meats/fish should not be fried -- baked, boiled or broiled is preferable. It is also important to cut back on your sugar intake.  Be sure to read labels - try to avoid anything with added sugar, high fructose corn syrup or other sweeteners.  If you must use a sweetener, you can try stevia or monkfruit.  It is also important to avoid artificially sweetened foods/beverages and diet drinks. Lastly, wear SPF 50 sunscreen on exposed skin and when in direct sunlight for an extended period of time.  Be sure to avoid fast food restaurants and aim for at least 60 ounces of water daily.        Orders: -     EKG 12-Lead  Type 2 diabetes mellitus with hyperlipidemia (HCC) Assessment & Plan: Chronic, diabetic foot exam was performed. I DISCUSSED WITH THE PATIENT AT LENGTH REGARDING THE GOALS OF GLYCEMIC CONTROL AND POSSIBLE LONG-TERM COMPLICATIONS.  I  ALSO STRESSED THE IMPORTANCE OF COMPLIANCE WITH HOME GLUCOSE MONITORING, DIETARY RESTRICTIONS INCLUDING AVOIDANCE OF SUGARY DRINKS/PROCESSED FOODS,  ALONG WITH REGULAR EXERCISE.  I  ALSO STRESSED THE IMPORTANCE OF ANNUAL EYE EXAMS, SELF FOOT CARE AND COMPLIANCE WITH OFFICE VISITS.   Orders: -     Microalbumin / creatinine urine ratio -     EKG 12-Lead -     CBC -     CMP14+EGFR -     Lipid panel -     Hemoglobin A1c  Essential hypertension, benign Assessment & Plan: Chronic, fair control.  She will continue with amlodipine 2.5mg  daily. Reminded to follow low sodium diet. I will reassess in four months.    Onychomycosis Assessment & Plan: Chronic, she is currently using OTC product.  May need to consider oral Lamisil or topical Penlac in the future.    BMI 27.0-27.9,adult Assessment & Plan: Her BMI is acceptable for her demographic.  She is encouraged to aim for at least 150  minutes of exercise/week.       I have personally reviewed and noted the following in the patient's chart:   Medical and social history Use of alcohol, tobacco or illicit drugs  Current medications and supplements Functional ability and status Nutritional status Physical activity Advanced directives List of other physicians Hospitalizations, surgeries, and ER visits in previous 12 months Vitals Screenings to include cognitive, depression, and falls Referrals and appointments  In addition, I have reviewed and discussed with patient certain preventive protocols, quality metrics, and best practice recommendations. A written personalized care plan for preventive services as well as general preventive health recommendations were provided to patient.     [name], MD 06/09/2023

## 2023-06-07 ENCOUNTER — Other Ambulatory Visit: Payer: Self-pay | Admitting: Internal Medicine

## 2023-06-07 DIAGNOSIS — E1169 Type 2 diabetes mellitus with other specified complication: Secondary | ICD-10-CM

## 2023-06-07 LAB — CBC
Hematocrit: 42.6 % (ref 34.0–46.6)
Hemoglobin: 13.7 g/dL (ref 11.1–15.9)
MCH: 29 pg (ref 26.6–33.0)
MCHC: 32.2 g/dL (ref 31.5–35.7)
MCV: 90 fL (ref 79–97)
Platelets: 253 10*3/uL (ref 150–450)
RBC: 4.72 x10E6/uL (ref 3.77–5.28)
RDW: 13.1 % (ref 11.7–15.4)
WBC: 7.6 10*3/uL (ref 3.4–10.8)

## 2023-06-07 LAB — CMP14+EGFR
ALT: 16 IU/L (ref 0–32)
AST: 18 IU/L (ref 0–40)
Albumin: 4.4 g/dL (ref 3.9–4.9)
Alkaline Phosphatase: 96 IU/L (ref 44–121)
BUN/Creatinine Ratio: 15 (ref 12–28)
BUN: 13 mg/dL (ref 8–27)
Bilirubin Total: 0.5 mg/dL (ref 0.0–1.2)
CO2: 28 mmol/L (ref 20–29)
Calcium: 10 mg/dL (ref 8.7–10.3)
Chloride: 101 mmol/L (ref 96–106)
Creatinine, Ser: 0.87 mg/dL (ref 0.57–1.00)
Globulin, Total: 2.6 g/dL (ref 1.5–4.5)
Glucose: 78 mg/dL (ref 70–99)
Potassium: 4.1 mmol/L (ref 3.5–5.2)
Sodium: 142 mmol/L (ref 134–144)
Total Protein: 7 g/dL (ref 6.0–8.5)
eGFR: 74 mL/min/{1.73_m2} (ref >59)

## 2023-06-07 LAB — LIPID PANEL
Chol/HDL Ratio: 2.4 ratio (ref 0.0–4.4)
Cholesterol, Total: 180 mg/dL (ref 100–199)
HDL: 75 mg/dL (ref >39)
LDL Chol Calc (NIH): 93 mg/dL (ref 0–99)
Triglycerides: 64 mg/dL (ref 0–149)
VLDL Cholesterol Cal: 12 mg/dL (ref 5–40)

## 2023-06-07 LAB — MICROALBUMIN / CREATININE URINE RATIO
Creatinine, Urine: 112.3 mg/dL
Microalb/Creat Ratio: 7 mg/g{creat} (ref 0–29)
Microalbumin, Urine: 7.4 ug/mL

## 2023-06-07 LAB — HEMOGLOBIN A1C
Est. average glucose Bld gHb Est-mCnc: 123 mg/dL
Hgb A1c MFr Bld: 5.9 % — ABNORMAL HIGH (ref 4.8–5.6)

## 2023-06-09 DIAGNOSIS — B351 Tinea unguium: Secondary | ICD-10-CM | POA: Insufficient documentation

## 2023-06-09 NOTE — Assessment & Plan Note (Signed)
Chronic, she is currently using OTC product.  May need to consider oral Lamisil or topical Penlac in the future.

## 2023-06-09 NOTE — Assessment & Plan Note (Addendum)
The annual wellness visit was performed including discussion of advanced directives, assessment of functional status and cognitive function. EKG performed, NSR w/ frequent ectopic ventricular beats.  A full exam was also performed. She will rto in one year for AWV with [facility] Advisor.  A full exam was performed.  Importance of monthly self breast exams was discussed with the patient.  She is advised to get 30-45 minutes of regular exercise, no less than four to five days per week. Both weight-bearing and aerobic exercises are recommended.  She is advised to follow a healthy diet with at least six fruits/veggies per day, decrease intake of red meat and other saturated fats and to increase fish intake to twice weekly.  Meats/fish should not be fried -- baked, boiled or broiled is preferable. It is also important to cut back on your sugar intake.  Be sure to read labels - try to avoid anything with added sugar, high fructose corn syrup or other sweeteners.  If you must use a sweetener, you can try stevia or monkfruit.  It is also important to avoid artificially sweetened foods/beverages and diet drinks. Lastly, wear SPF 50 sunscreen on exposed skin and when in direct sunlight for an extended period of time.  Be sure to avoid fast food restaurants and aim for at least 60 ounces of water daily.

## 2023-06-09 NOTE — Assessment & Plan Note (Signed)
Chronic, fair control.  She will continue with amlodipine 2.5mg  daily. Reminded to follow low sodium diet. I will reassess in four months.

## 2023-06-09 NOTE — Assessment & Plan Note (Signed)
 Chronic, diabetic foot exam was performed. I DISCUSSED WITH THE PATIENT AT LENGTH REGARDING THE GOALS OF GLYCEMIC CONTROL AND POSSIBLE LONG-TERM COMPLICATIONS.  I  ALSO STRESSED THE IMPORTANCE OF COMPLIANCE WITH HOME GLUCOSE MONITORING, DIETARY RESTRICTIONS INCLUDING AVOIDANCE OF SUGARY DRINKS/PROCESSED FOODS,  ALONG WITH REGULAR EXERCISE.  I  ALSO STRESSED THE IMPORTANCE OF ANNUAL EYE EXAMS, SELF FOOT CARE AND COMPLIANCE WITH OFFICE VISITS.

## 2023-06-09 NOTE — Assessment & Plan Note (Signed)
Her BMI is acceptable for her demographic. She is encouraged to aim for at least 150 minutes of exercise/week.

## 2023-06-10 ENCOUNTER — Telehealth: Payer: Self-pay | Admitting: Neurology

## 2023-06-10 NOTE — Telephone Encounter (Signed)
The patient left me a voice mail saying that she is scheduled for a MRI on September 12 and is wanting to know her copay. I do not see that she is scheduled for a MRI anywhere in her chart and the last MRI we ordered for her was a year ago in September when she was last seen.

## 2023-06-10 NOTE — Telephone Encounter (Signed)
REQUIRED PHONE NOTE: pt called asking about a MRI that does not show to be scheduled

## 2023-06-12 ENCOUNTER — Encounter: Payer: Self-pay | Admitting: Internal Medicine

## 2023-06-14 ENCOUNTER — Other Ambulatory Visit: Payer: Self-pay

## 2023-06-14 MED ORDER — PRAVASTATIN SODIUM 80 MG PO TABS: 80.0000 mg | ORAL_TABLET | Freq: Every evening | ORAL | 2 refills | Status: DC

## 2023-07-10 ENCOUNTER — Other Ambulatory Visit: Payer: Self-pay | Admitting: Internal Medicine

## 2023-07-15 ENCOUNTER — Ambulatory Visit (INDEPENDENT_AMBULATORY_CARE_PROVIDER_SITE_OTHER): Payer: 59 | Admitting: Internal Medicine

## 2023-07-15 ENCOUNTER — Encounter: Payer: Self-pay | Admitting: Internal Medicine

## 2023-07-15 VITALS — BP 124/70 | HR 98 | Temp 98.7°F | Ht 66.0 in | Wt 164.6 lb

## 2023-07-15 DIAGNOSIS — E782 Mixed hyperlipidemia: Secondary | ICD-10-CM | POA: Diagnosis not present

## 2023-07-15 DIAGNOSIS — E663 Overweight: Secondary | ICD-10-CM

## 2023-07-15 DIAGNOSIS — Z6826 Body mass index (BMI) 26.0-26.9, adult: Secondary | ICD-10-CM | POA: Diagnosis not present

## 2023-07-15 DIAGNOSIS — R1013 Epigastric pain: Secondary | ICD-10-CM

## 2023-07-15 DIAGNOSIS — Z79899 Other long term (current) drug therapy: Secondary | ICD-10-CM | POA: Diagnosis not present

## 2023-07-15 DIAGNOSIS — R000 Tachycardia, unspecified: Secondary | ICD-10-CM

## 2023-07-15 NOTE — Patient Instructions (Signed)
Cholesterol Content in Foods Cholesterol is a waxy, fat-like substance that helps to carry fat in the blood. The body needs cholesterol in small amounts, but too much cholesterol can cause damage to the arteries and heart. What foods have cholesterol?  Cholesterol is found in animal-based foods, such as meat, seafood, and dairy. Generally, low-fat dairy and lean meats have less cholesterol than full-fat dairy and fatty meats. The milligrams of cholesterol per serving (mg per serving) of common cholesterol-containing foods are listed below. Meats and other proteins Egg -- one large whole egg has 186 mg. Veal shank -- 4 oz (113 g) has 141 mg. Lean ground [place] (93% lean) -- 4 oz (113 g) has 118 mg. Fat-trimmed lamb loin -- 4 oz (113 g) has 106 mg. Lean ground beef (90% lean) -- 4 oz (113 g) has 100 mg. Lobster -- 3.5 oz (99 g) has 90 mg. Pork loin chops -- 4 oz (113 g) has 86 mg. Canned salmon -- 3.5 oz (99 g) has 83 mg. Fat-trimmed beef top loin -- 4 oz (113 g) has 78 mg. Frankfurter -- 1 frank (3.5 oz or 99 g) has 77 mg. Crab -- 3.5 oz (99 g) has 71 mg. Roasted chicken without skin, white meat -- 4 oz (113 g) has 66 mg. Light bologna -- 2 oz (57 g) has 45 mg. Deli-cut [place] -- 2 oz (57 g) has 31 mg. Canned tuna -- 3.5 oz (99 g) has 31 mg. Tomasa Blase -- 1 oz (28 g) has 29 mg. Oysters and mussels (raw) -- 3.5 oz (99 g) has 25 mg. Mackerel -- 1 oz (28 g) has 22 mg. Trout -- 1 oz (28 g) has 20 mg. Pork sausage -- 1 link (1 oz or 28 g) has 17 mg. Salmon -- 1 oz (28 g) has 16 mg. Tilapia -- 1 oz (28 g) has 14 mg. Dairy Soft-serve ice cream --  cup (4 oz or 86 g) has 103 mg. Whole-milk yogurt -- 1 cup (8 oz or 245 g) has 29 mg. Cheddar cheese -- 1 oz (28 g) has 28 mg. American cheese -- 1 oz (28 g) has 28 mg. Whole milk -- 1 cup (8 oz or 250 mL) has 23 mg. 2% milk -- 1 cup (8 oz or 250 mL) has 18 mg. Cream cheese -- 1 tablespoon (Tbsp) (14.5 g) has 15 mg. Cottage cheese --  cup (4 oz or  113 g) has 14 mg. Low-fat (1%) milk -- 1 cup (8 oz or 250 mL) has 10 mg. Sour cream -- 1 Tbsp (12 g) has 8.5 mg. Low-fat yogurt -- 1 cup (8 oz or 245 g) has 8 mg. Nonfat Greek yogurt -- 1 cup (8 oz or 228 g) has 7 mg. Half-and-half cream -- 1 Tbsp (15 mL) has 5 mg. Fats and oils Cod liver oil -- 1 tablespoon (Tbsp) (13.6 g) has 82 mg. Butter -- 1 Tbsp (14 g) has 15 mg. Lard -- 1 Tbsp (12.8 g) has 14 mg. Bacon grease -- 1 Tbsp (12.9 g) has 14 mg. Mayonnaise -- 1 Tbsp (13.8 g) has 5-10 mg. Margarine -- 1 Tbsp (14 g) has 3-10 mg. The items listed above may not be a complete list of foods with cholesterol. Exact amounts of cholesterol in these foods may vary depending on specific ingredients and brands. Contact a dietitian for more information. What foods do not have cholesterol? Most plant-based foods do not have cholesterol unless you combine them with a food that has  cholesterol. Foods without cholesterol include: Grains and cereals. Vegetables. Fruits. Vegetable oils, such as olive, canola, and sunflower oil. Legumes, such as peas, beans, and lentils. Nuts and seeds. Egg whites. The items listed above may not be a complete list of foods that do not have cholesterol. Contact a dietitian for more information. Summary The body needs cholesterol in small amounts, but too much cholesterol can cause damage to the arteries and heart. Cholesterol is found in animal-based foods, such as meat, seafood, and dairy. Generally, low-fat dairy and lean meats have less cholesterol than full-fat dairy and fatty meats. This information is not intended to replace advice given to you by your health care provider. Make sure you discuss any questions you have with your health care provider. Document Revised: 01/27/2021 Document Reviewed: 01/27/2021 Elsevier Patient Education  2024 [employer].

## 2023-07-15 NOTE — Progress Notes (Signed)
I,Victoria T Deloria Lair, CMA,acting as a Neurosurgeon for [name], MD.,have documented all relevant documentation on the behalf of [name], MD,as directed by  [name], MD while in the presence of [name], MD.  Subjective:  Patient ID: Jasmine Rodriguez , female    DOB: 07-19-1956 , 67 y.o.   MRN: [id number]  Chief Complaint  Patient presents with   Hyperlipidemia    HPI  She presents today for cholesterol follow up. She reports compliance with medications. The dose of pravastatin was increased to 80mg  at her last visit. She has not had any issues with the dose change. Patient reports no concerns today.   BP Readings from Last 3 Encounters: 07/15/23 : 124/70 06/06/23 : 130/82 02/07/23 : 126/84       Past Medical History:  Diagnosis Date   DDD (degenerative disc disease)    DM (diabetes mellitus) (HCC)    GERD (gastroesophageal reflux disease)    High cholesterol    HTN (hypertension)      Family History  Problem Relation Age of Onset   Hypertension Mother    Arthritis Mother    Congestive Heart Failure Mother    Hypertension Father    Head & neck cancer Father      Current Outpatient Medications:    Accu-Chek FastClix Lancets MISC, USE AS DIRECTED TO CHECK BLOOD SUGARS 2 TIMES PER DAY., Disp: 102 each, Rfl: 0   acetaminophen (TYLENOL) 500 MG tablet, Take 500 mg by mouth every 6 (six) hours as needed., Disp: , Rfl:    amLODipine (NORVASC) 2.5 MG tablet, TAKE 1 TABLET BY MOUTH EVERY DAY, Disp: 90 tablet, Rfl: 1   Calcium Carbonate (CALTRATE 600 PO), Take by mouth. daily, Disp: , Rfl:    cholecalciferol (VITAMIN D) 1000 UNITS tablet, Take 2,000 Units by mouth daily., Disp: , Rfl:    cyclobenzaprine (FLEXERIL) 10 MG tablet, TAKE 1 TABLET BY MOUTH AT BEDTIME AS NEEDED FOR MUSCLE SPASMS., Disp: 30 tablet, Rfl: 1   fluocinonide (LIDEX) 0.05 % external solution, APPLY 1 ML TO THE AFFECTED AREA NIGHTLY AS NEEDED, Disp: 60 mL, Rfl: 3    glucosamine-chondroitin 500-400 MG tablet, Take 1 tablet by mouth 2 (two) times daily. , Disp: , Rfl:    glucose blood (CONTOUR NEXT TEST) test strip, USE AS INSTRUCTED TO CHECK BLOOD SUGARS DAILY E11.69, Disp: 100 strip, Rfl: 12   lactase (LACTAID) 3000 UNITS tablet, Take 1 tablet by mouth 3 (three) times daily as needed. Dairy consumption, Disp: , Rfl:    lidocaine (LIDODERM) 5 %, Remove & Discard patch within 12 hours or as directed by MD, Disp: 90 patch, Rfl: 2   Microlet Lancets MISC, Use as directed to check blood sugars E11.69, Disp: 100 each, Rfl: 2   nystatin (MYCOSTATIN/NYSTOP) powder, USE AS DIRECTED, Disp: 60 g, Rfl: 1   pravastatin (PRAVACHOL) 80 MG tablet, Take 1 tablet (80 mg total) by mouth every evening., Disp: 90 tablet, Rfl: 2   sitaGLIPtin-metformin (JANUMET) 50-500 MG tablet, TAKE 1 TABLET BY MOUTH TWICE A DAY, Disp: 180 tablet, Rfl: 2   valACYclovir (VALTREX) 500 MG tablet, TAKE 1 TABLET BY MOUTH EVERY DAY, Disp: 30 tablet, Rfl: 5   albuterol (PROAIR HFA) 108 (90 Base) MCG/ACT inhaler, Inhale 2 puffs into the lungs every 6 (six) hours as needed for wheezing or shortness of breath. (Patient not taking: Reported on 06/06/2023), Disp: 18 g, Rfl: 3   omeprazole (PRILOSEC) 20 MG capsule, TAKE 1  CAPSULE BY MOUTH EVERY DAY, Disp: 90 capsule, Rfl: 2   Allergies  Allergen Reactions   Skelaxin [Metaxalone] Anaphylaxis, Itching and Rash   Lisinopril      Review of Systems  Constitutional: Negative.   Respiratory: Negative.    Cardiovascular: Negative.  Negative for palpitations.  Gastrointestinal: Negative.   Neurological: Negative.   Psychiatric/Behavioral: Negative.       Today's Vitals   07/15/23 1600  BP: 124/70  Pulse: 98  Temp: 98.7 F (37.1 C)  TempSrc: Oral  Weight: 164 lb 9.6 oz (74.7 kg)  Height: 5\' 6"  (1.676 m)  PainSc: 0-No pain   Body mass index is 26.57 kg/m.  Wt Readings from Last 3 Encounters:  07/15/23 164 lb 9.6 oz (74.7 kg)  06/06/23 169 lb (76.7  kg)  02/07/23 168 lb 6.4 oz (76.4 kg)     Objective:  Physical Exam Vitals and nursing note reviewed.  Constitutional:      Appearance: Normal appearance.  HENT:     Head: Normocephalic and atraumatic.  Eyes:     Extraocular Movements: Extraocular movements intact.  Cardiovascular:     Rate and Rhythm: Regular rhythm. Tachycardia present.     Heart sounds: Normal heart sounds.  Pulmonary:     Effort: Pulmonary effort is normal.     Breath sounds: Normal breath sounds.  Musculoskeletal:     Cervical back: Normal range of motion.  Skin:    General: Skin is warm.  Neurological:     General: No focal deficit present.     Mental Status: She is alert.  Psychiatric:        Mood and Affect: Mood normal.        Behavior: Behavior normal.         Assessment And Plan:  Mixed hyperlipidemia Assessment & Plan: Chronic, LDL goal is less than 70. She is now taking pravastatin 80mg  daily. Will check labs as below and adjust/change meds as needed.   Orders: -     Lipid panel -     ALT  Tachycardia Assessment & Plan: I will check thyroid panel. She denies palpitations. Suggest decreasing/avoiding caffeine.   Orders: -     TSH + free T4 -     Magnesium  Overweight with body mass index (BMI) of 26 to 26.9 in adult Assessment & Plan: She has lost five pounds since last visit. She does report decreased appetite. Will also check thyroid panel.    Drug therapy -     ALT     Return if symptoms worsen or fail to improve.  Patient was given opportunity to ask questions. Patient verbalized understanding of the plan and was able to repeat key elements of the plan. All questions were answered to their satisfaction.    I, [name], MD, have reviewed all documentation for this visit. The documentation on 07/15/23 for the exam, diagnosis, procedures, and orders are all accurate and complete.   IF YOU HAVE BEEN REFERRED TO A SPECIALIST, IT MAY TAKE 1-2 WEEKS TO  SCHEDULE/PROCESS THE REFERRAL. IF YOU HAVE NOT HEARD FROM US/SPECIALIST IN TWO WEEKS, PLEASE GIVE [place] A CALL AT [phone].   THE PATIENT IS ENCOURAGED TO PRACTICE SOCIAL DISTANCING DUE TO THE COVID-19 PANDEMIC.

## 2023-07-16 LAB — LIPID PANEL
Chol/HDL Ratio: 2.2 {ratio} (ref 0.0–4.4)
Cholesterol, Total: 167 mg/dL (ref 100–199)
HDL: 76 mg/dL (ref >39)
LDL Chol Calc (NIH): 78 mg/dL (ref 0–99)
Triglycerides: 66 mg/dL (ref 0–149)
VLDL Cholesterol Cal: 13 mg/dL (ref 5–40)

## 2023-07-16 LAB — MAGNESIUM: Magnesium: 2.2 mg/dL (ref 1.6–2.3)

## 2023-07-16 LAB — TSH+FREE T4
Free T4: 1.17 ng/dL (ref 0.82–1.77)
TSH: 1.16 u[IU]/mL (ref 0.450–4.500)

## 2023-07-16 LAB — ALT: ALT: 16 [IU]/L (ref 0–32)

## 2023-07-17 ENCOUNTER — Other Ambulatory Visit: Payer: Self-pay | Admitting: Internal Medicine

## 2023-07-17 DIAGNOSIS — R0789 Other chest pain: Secondary | ICD-10-CM

## 2023-07-22 DIAGNOSIS — R1013 Epigastric pain: Secondary | ICD-10-CM | POA: Insufficient documentation

## 2023-07-22 DIAGNOSIS — E782 Mixed hyperlipidemia: Secondary | ICD-10-CM | POA: Insufficient documentation

## 2023-07-22 DIAGNOSIS — R000 Tachycardia, unspecified: Secondary | ICD-10-CM | POA: Insufficient documentation

## 2023-07-22 NOTE — Assessment & Plan Note (Signed)
Chronic, LDL goal is less than 70. She is now taking pravastatin 80mg  daily. Will check labs as below and adjust/change meds as needed.

## 2023-07-22 NOTE — Assessment & Plan Note (Signed)
She has lost five pounds since last visit. She does report decreased appetite. Will also check thyroid panel.

## 2023-07-22 NOTE — Assessment & Plan Note (Signed)
I will check thyroid panel. She denies palpitations. Suggest decreasing/avoiding caffeine.

## 2023-09-03 DIAGNOSIS — E119 Type 2 diabetes mellitus without complications: Secondary | ICD-10-CM | POA: Diagnosis not present

## 2023-09-03 DIAGNOSIS — H35033 Hypertensive retinopathy, bilateral: Secondary | ICD-10-CM | POA: Diagnosis not present

## 2023-09-03 DIAGNOSIS — H401111 Primary open-angle glaucoma, right eye, mild stage: Secondary | ICD-10-CM | POA: Diagnosis not present

## 2023-09-10 IMAGING — CR DG CHEST 2V
2 series · 2 of 2 positions shown · non-contrast
Comparison: 07/17/2005

CLINICAL DATA: Chest discomfort

EXAM:
CHEST - 2 VIEW

[w chest pa]
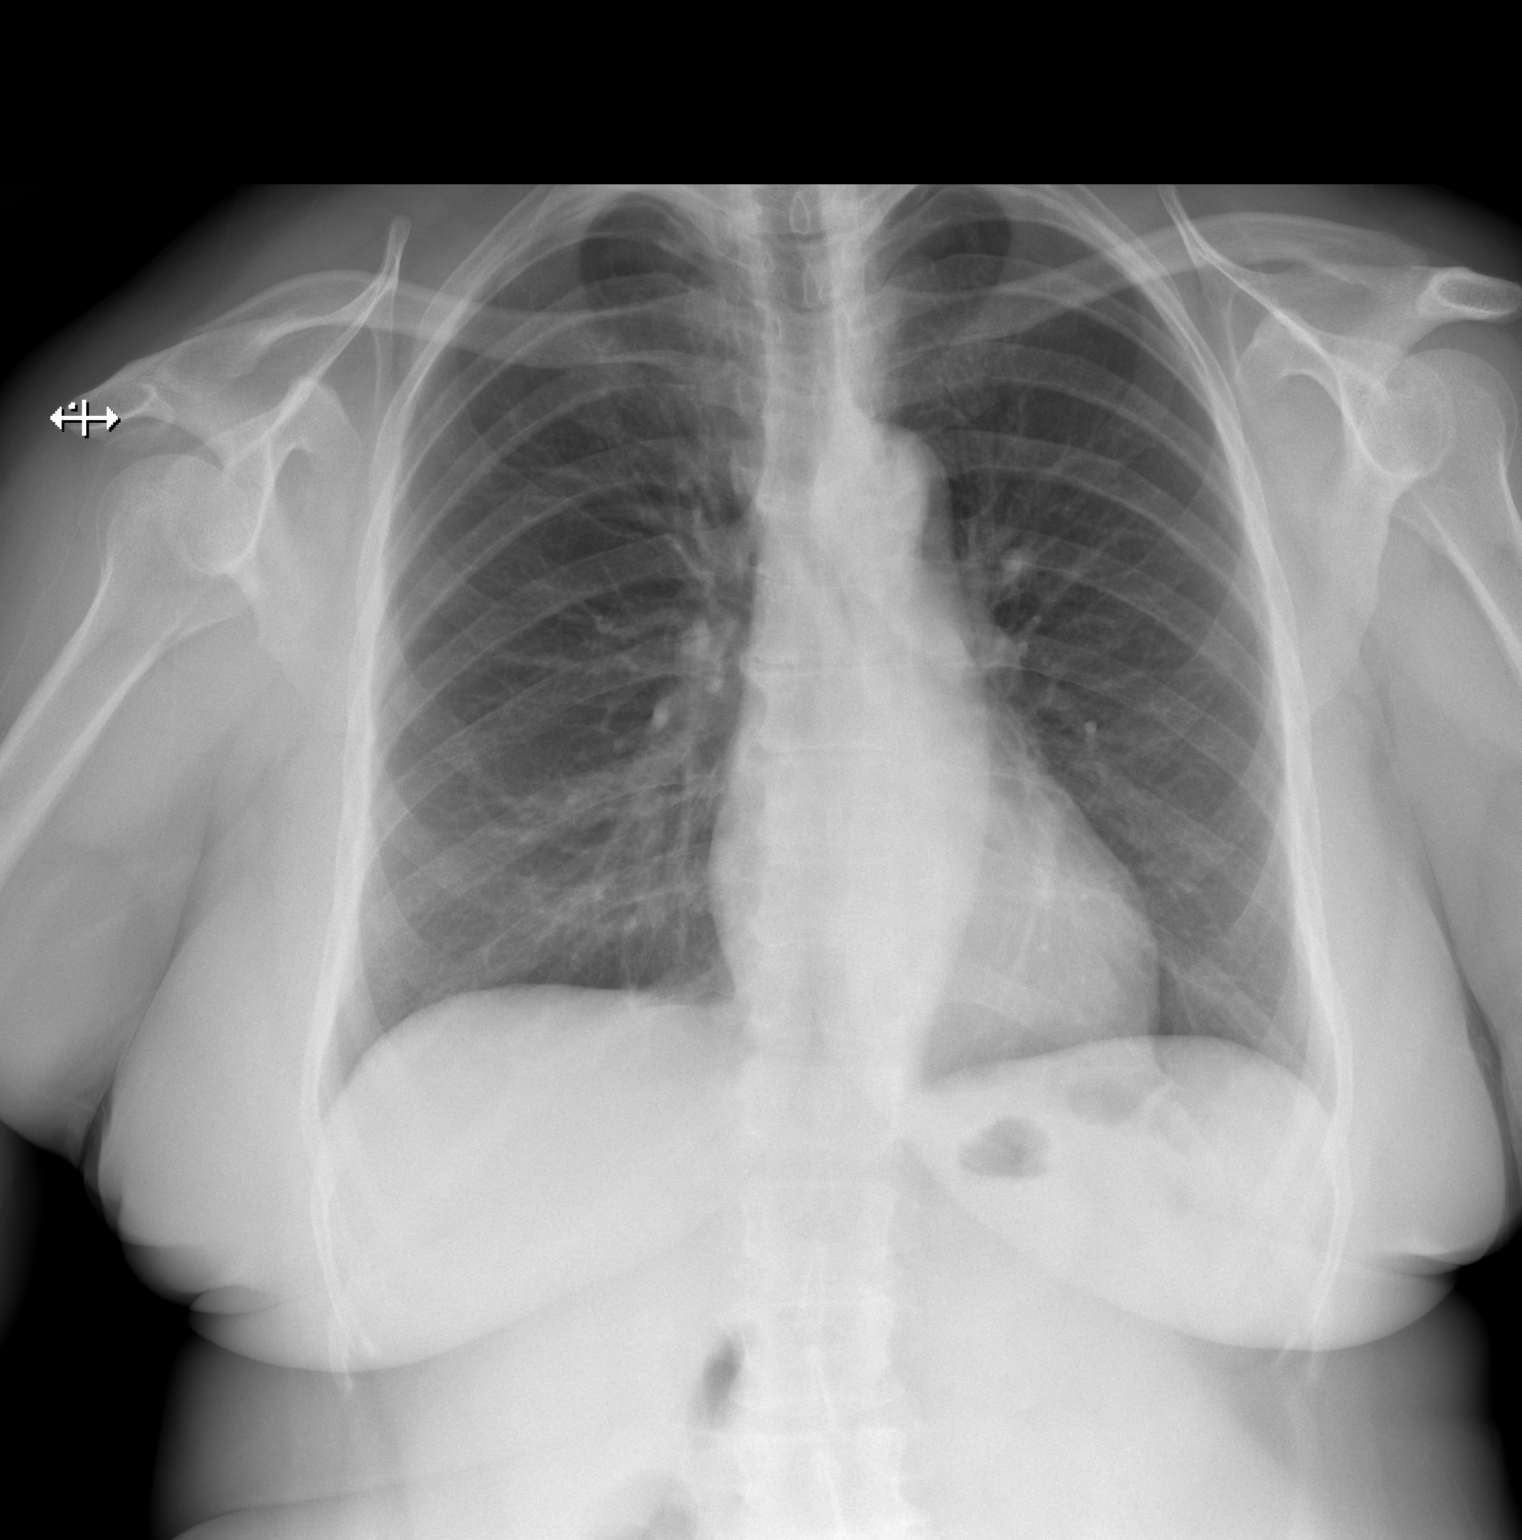

[w chest lat]
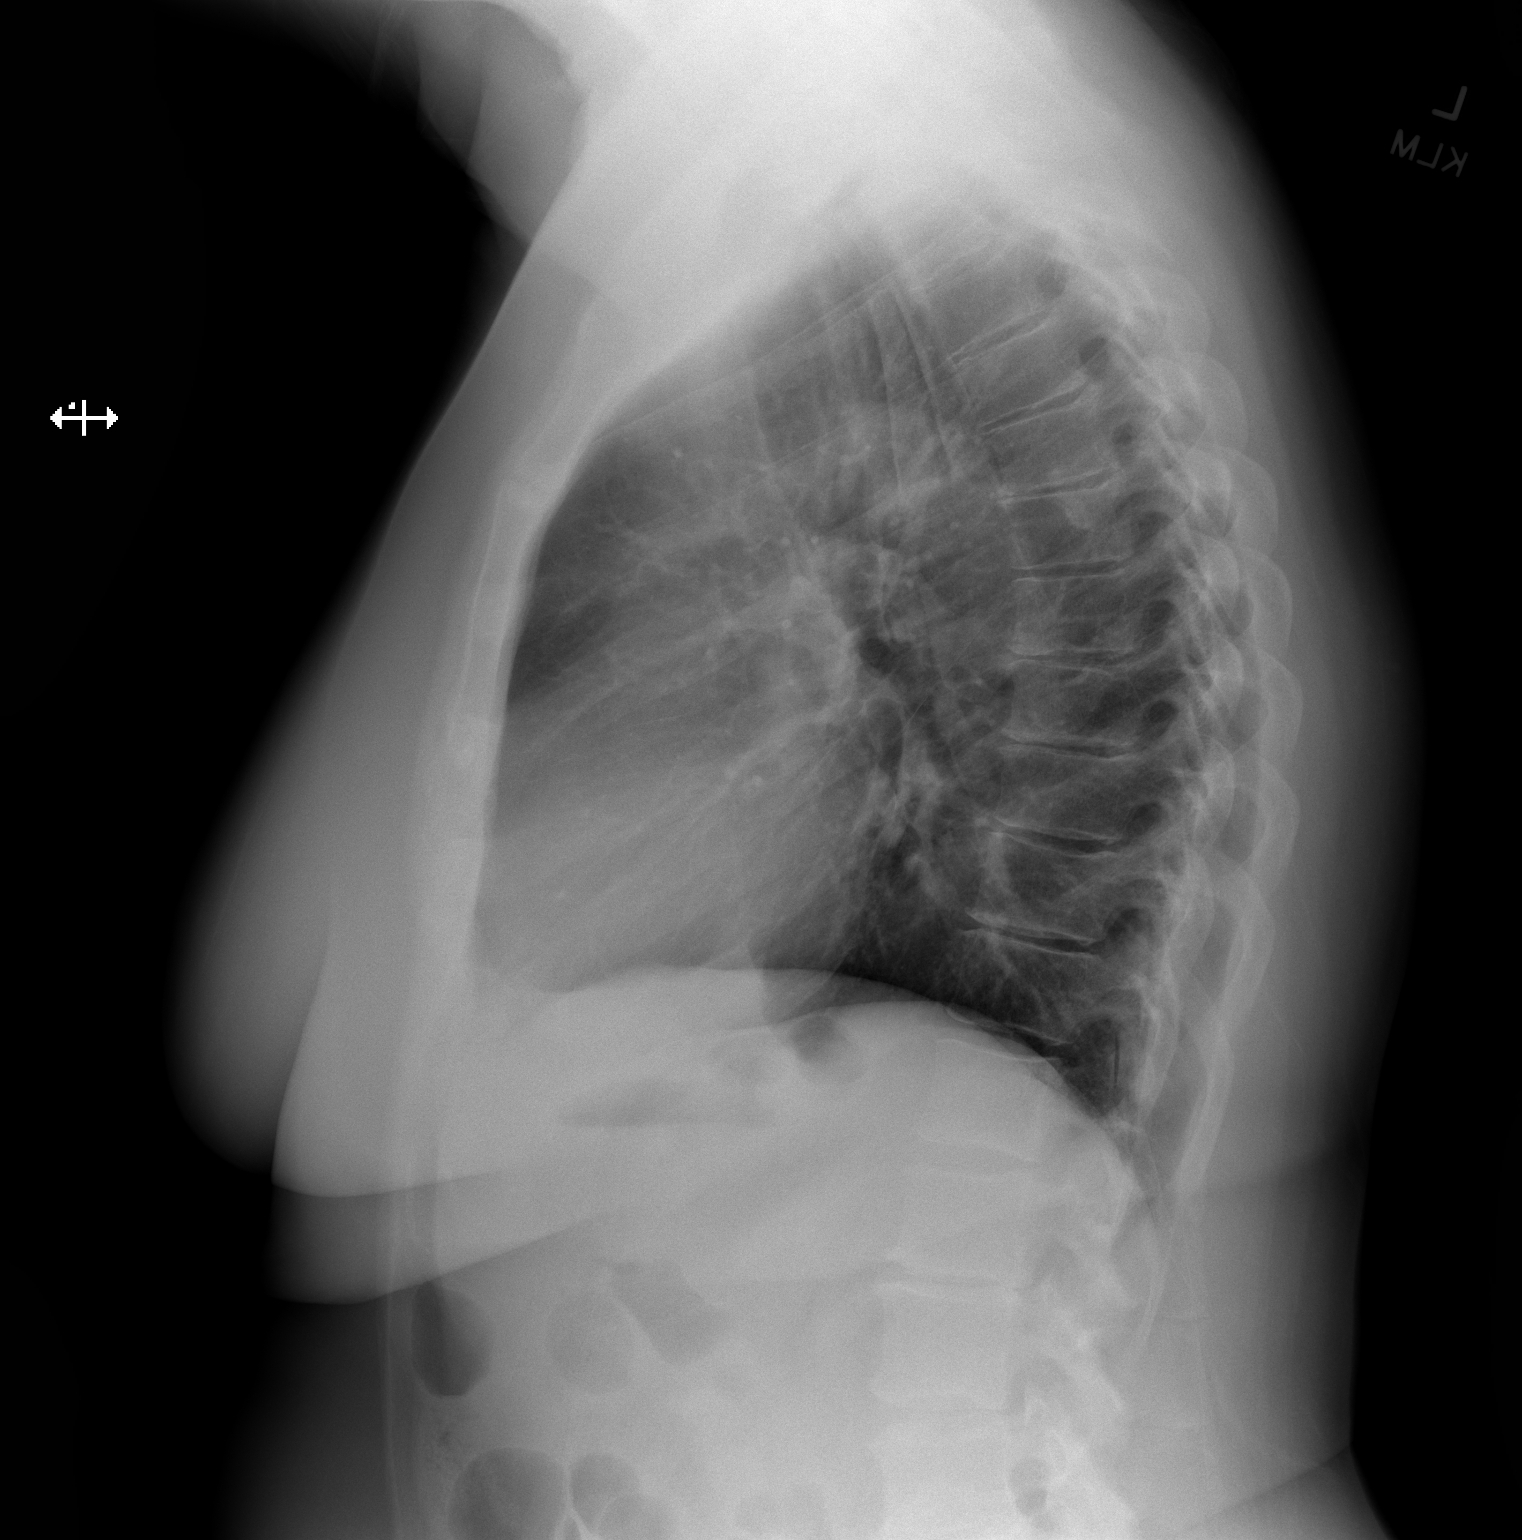

[2 of 2 positions shown; findings below may reference images not displayed]

FINDINGS: The heart size and mediastinal contours are within normal limits.
Both lungs are clear. The visualized skeletal structures are
unremarkable.
IMPRESSION: No active cardiopulmonary disease.

## 2023-10-03 DIAGNOSIS — E119 Type 2 diabetes mellitus without complications: Secondary | ICD-10-CM | POA: Diagnosis not present

## 2023-10-03 DIAGNOSIS — H35033 Hypertensive retinopathy, bilateral: Secondary | ICD-10-CM | POA: Diagnosis not present

## 2023-10-03 DIAGNOSIS — H401111 Primary open-angle glaucoma, right eye, mild stage: Secondary | ICD-10-CM | POA: Diagnosis not present

## 2023-10-08 ENCOUNTER — Encounter: Payer: Self-pay | Admitting: Internal Medicine

## 2023-10-08 ENCOUNTER — Ambulatory Visit (INDEPENDENT_AMBULATORY_CARE_PROVIDER_SITE_OTHER): Payer: Medicare HMO | Admitting: Internal Medicine

## 2023-10-08 ENCOUNTER — Other Ambulatory Visit: Payer: Self-pay

## 2023-10-08 VITALS — BP 134/82 | HR 89 | Temp 97.9°F | Ht 66.0 in | Wt 165.0 lb

## 2023-10-08 DIAGNOSIS — M5442 Lumbago with sciatica, left side: Secondary | ICD-10-CM | POA: Diagnosis not present

## 2023-10-08 DIAGNOSIS — E785 Hyperlipidemia, unspecified: Secondary | ICD-10-CM | POA: Diagnosis not present

## 2023-10-08 DIAGNOSIS — G8929 Other chronic pain: Secondary | ICD-10-CM

## 2023-10-08 DIAGNOSIS — E782 Mixed hyperlipidemia: Secondary | ICD-10-CM

## 2023-10-08 DIAGNOSIS — I10 Essential (primary) hypertension: Secondary | ICD-10-CM | POA: Diagnosis not present

## 2023-10-08 DIAGNOSIS — Z23 Encounter for immunization: Secondary | ICD-10-CM | POA: Diagnosis not present

## 2023-10-08 DIAGNOSIS — E1169 Type 2 diabetes mellitus with other specified complication: Secondary | ICD-10-CM | POA: Diagnosis not present

## 2023-10-08 MED ORDER — AMLODIPINE BESYLATE 2.5 MG PO TABS: 2.5000 mg | ORAL_TABLET | Freq: Every day | ORAL | 1 refills | Status: DC

## 2023-10-08 MED ORDER — JANUMET 50-500 MG PO TABS: ORAL_TABLET | ORAL | 2 refills | Status: DC

## 2023-10-08 MED ORDER — ALBUTEROL SULFATE HFA 108 (90 BASE) MCG/ACT IN AERS: 2.0000 | INHALATION_SPRAY | Freq: Four times a day (QID) | RESPIRATORY_TRACT | 3 refills | Status: AC | PRN

## 2023-10-08 MED ORDER — LIDOCAINE 5 % EX PTCH
MEDICATED_PATCH | CUTANEOUS | 2 refills | Status: DC
Start: 2023-10-08 — End: 2024-02-20

## 2023-10-08 NOTE — Patient Instructions (Addendum)

## 2023-10-08 NOTE — Progress Notes (Signed)
 I,Jasmine Rodriguez, CMA,acting as a neurosurgeon for Jasmine LOISE Slocumb, MD.,have documented all relevant documentation on the behalf of Jasmine LOISE Slocumb, MD,as directed by  Jasmine LOISE Slocumb, MD while in the presence of Jasmine LOISE Slocumb, MD.  Subjective:    Patient ID: Jasmine Rodriguez , female    DOB: 25-Mar-1956 , 68 y.o.   MRN: [id number]  Chief Complaint  Patient presents with   Hypertension   Diabetes   Hyperlipidemia    HPI  Patient presents for bp, diabetes & cholesterol follow up. She reports compliance with medications. Denies headache, chest pain & sob. She has no specific questions or concerns.      Past Medical History:  Diagnosis Date   DDD (degenerative disc disease)    DM (diabetes mellitus) (HCC)    GERD (gastroesophageal reflux disease)    High cholesterol    HTN (hypertension)      Family History  Problem Relation Age of Onset   Hypertension Mother    Arthritis Mother    Congestive Heart Failure Mother    Hypertension Father    Head & neck cancer Father      Current Outpatient Medications:    Accu-Chek FastClix Lancets MISC, USE AS DIRECTED TO CHECK BLOOD SUGARS 2 TIMES PER DAY., Disp: 102 each, Rfl: 0   acetaminophen (TYLENOL) 500 MG tablet, Take 500 mg by mouth every 6 (six) hours as needed., Disp: , Rfl:    Calcium Carbonate (CALTRATE 600 PO), Take by mouth. daily, Disp: , Rfl:    cholecalciferol (VITAMIN D ) 1000 UNITS tablet, Take 2,000 Units by mouth daily., Disp: , Rfl:    cyclobenzaprine  (FLEXERIL ) 10 MG tablet, TAKE 1 TABLET BY MOUTH AT BEDTIME AS NEEDED FOR MUSCLE SPASMS., Disp: 30 tablet, Rfl: 1   fluocinonide  (LIDEX ) 0.05 % external solution, APPLY 1 ML TO THE AFFECTED AREA NIGHTLY AS NEEDED, Disp: 60 mL, Rfl: 3   glucosamine-chondroitin 500-400 MG tablet, Take 1 tablet by mouth 2 (two) times daily. , Disp: , Rfl:    glucose blood (CONTOUR NEXT TEST) test strip, USE AS INSTRUCTED TO CHECK BLOOD SUGARS DAILY E11.69, Disp: 100 strip, Rfl: 12   lactase  (LACTAID) 3000 UNITS tablet, Take 1 tablet by mouth 3 (three) times daily as needed. Dairy consumption, Disp: , Rfl:    Microlet Lancets MISC, Use as directed to check blood sugars E11.69, Disp: 100 each, Rfl: 2   nystatin  (MYCOSTATIN /NYSTOP ) powder, USE AS DIRECTED, Disp: 60 g, Rfl: 1   omeprazole  (PRILOSEC) 20 MG capsule, TAKE 1 CAPSULE BY MOUTH EVERY DAY, Disp: 90 capsule, Rfl: 2   pravastatin  (PRAVACHOL ) 80 MG tablet, Take 1 tablet (80 mg total) by mouth every evening., Disp: 90 tablet, Rfl: 2   valACYclovir  (VALTREX ) 500 MG tablet, TAKE 1 TABLET BY MOUTH EVERY DAY, Disp: 30 tablet, Rfl: 5   albuterol  (PROAIR  HFA) 108 (90 Base) MCG/ACT inhaler, Inhale 2 puffs into the lungs every 6 (six) hours as needed for wheezing or shortness of breath., Disp: 18 g, Rfl: 3   amLODipine  (NORVASC ) 2.5 MG tablet, Take 1 tablet (2.5 mg total) by mouth daily., Disp: 90 tablet, Rfl: 1   lidocaine  (LIDODERM ) 5 %, Remove & Discard patch within 12 hours or as directed by MD, Disp: 90 patch, Rfl: 2   sitaGLIPtin -metformin  (JANUMET ) 50-500 MG tablet, TAKE 1 TABLET BY MOUTH once daily, Disp: 90 tablet, Rfl: 2   Allergies  Allergen Reactions   Skelaxin [Metaxalone] Anaphylaxis, Itching and Rash   Lisinopril  Review of Systems  Constitutional: Negative.   HENT: Negative.    Eyes: Negative.   Respiratory: Negative.    Cardiovascular: Negative.   Gastrointestinal: Negative.   Genitourinary: Negative.   Neurological: Negative.   Hematological: Negative.   Psychiatric/Behavioral: Negative.       Today's Vitals   10/08/23 1123  BP: 134/82  Pulse: 89  Temp: 97.9 F (36.6 C)  SpO2: 98%  Weight: 165 lb (74.8 kg)  Height: 5' 6 (1.676 m)   Body mass index is 26.63 kg/m.  Wt Readings from Last 3 Encounters:  10/08/23 165 lb (74.8 kg)  07/15/23 164 lb 9.6 oz (74.7 kg)  06/06/23 169 lb (76.7 kg)     Objective:  Physical Exam Vitals and nursing note reviewed.  Constitutional:      Appearance:  Normal appearance.  HENT:     Head: Normocephalic and atraumatic.  Eyes:     Extraocular Movements: Extraocular movements intact.  Cardiovascular:     Rate and Rhythm: Normal rate and regular rhythm.     Heart sounds: Normal heart sounds.  Pulmonary:     Effort: Pulmonary effort is normal.     Breath sounds: Normal breath sounds.  Musculoskeletal:     Cervical back: Normal range of motion.  Skin:    General: Skin is warm.  Neurological:     General: No focal deficit present.     Mental Status: She is alert.  Psychiatric:        Mood and Affect: Mood normal.        Behavior: Behavior normal.         Assessment And Plan:     Essential hypertension, benign Assessment & Plan: Chronic, fair control.  Goal BP <120/80.  She will continue with amlodipine  2.5mg  daily. Reminded to follow low sodium diet and to aim for at least 150 minutes of exercise per week. I will reassess in four months.   Orders: -     amLODIPine  Besylate; Take 1 tablet (2.5 mg total) by mouth daily.  Dispense: 90 tablet; Refill: 1  Type 2 diabetes mellitus with hyperlipidemia (HCC) Assessment & Plan: Chronic, she will continue with Janumet  50/500mg  once daily. Importance of dietary/exercise compliance was discussed with the patient. She will f/u in four months for re-evaluation.   Orders: -     CMP14+EGFR -     Hemoglobin A1c -     Janumet ; TAKE 1 TABLET BY MOUTH once daily  Dispense: 90 tablet; Refill: 2  Chronic left-sided low back pain with left-sided sciatica -     Lidocaine ; Remove & Discard patch within 12 hours or as directed by MD  Dispense: 90 patch; Refill: 2  Immunization due -     Varicella-zoster vaccine IM  Other orders -     Albuterol  Sulfate HFA; Inhale 2 puffs into the lungs every 6 (six) hours as needed for wheezing or shortness of breath.  Dispense: 18 g; Refill: 3     Return for 1 year HM, 4 month bp & dm . Patient was given opportunity to ask questions. Patient verbalized  understanding of the plan and was able to repeat key elements of the plan. All questions were answered to their satisfaction.     I, Jasmine LOISE Slocumb, MD, have reviewed all documentation for this visit. The documentation on 10/08/23 for the exam, diagnosis, procedures, and orders are all accurate and complete.

## 2023-10-09 LAB — CMP14+EGFR
ALT: 15 [IU]/L (ref 0–32)
AST: 19 [IU]/L (ref 0–40)
Albumin: 4.8 g/dL (ref 3.9–4.9)
Alkaline Phosphatase: 93 [IU]/L (ref 44–121)
BUN/Creatinine Ratio: 13 (ref 12–28)
BUN: 12 mg/dL (ref 8–27)
Bilirubin Total: 0.4 mg/dL (ref 0.0–1.2)
CO2: 25 mmol/L (ref 20–29)
Calcium: 9.8 mg/dL (ref 8.7–10.3)
Chloride: 103 mmol/L (ref 96–106)
Creatinine, Ser: 0.91 mg/dL (ref 0.57–1.00)
Globulin, Total: 2.3 g/dL (ref 1.5–4.5)
Glucose: 106 mg/dL — ABNORMAL HIGH (ref 70–99)
Potassium: 4.3 mmol/L (ref 3.5–5.2)
Sodium: 143 mmol/L (ref 134–144)
Total Protein: 7.1 g/dL (ref 6.0–8.5)
eGFR: 70 mL/min/{1.73_m2} (ref >59)

## 2023-10-09 LAB — HEMOGLOBIN A1C
Est. average glucose Bld gHb Est-mCnc: 123 mg/dL
Hgb A1c MFr Bld: 5.9 % — ABNORMAL HIGH (ref 4.8–5.6)

## 2023-10-11 NOTE — Assessment & Plan Note (Signed)
 Chronic, she will continue with Janumet 50/500mg  once daily. Importance of dietary/exercise compliance was discussed with the patient. She will f/u in four months for re-evaluation.

## 2023-10-11 NOTE — Assessment & Plan Note (Signed)
 Chronic, fair control.  Goal BP <120/80.  She will continue with amlodipine 2.5mg  daily. Reminded to follow low sodium diet and to aim for at least 150 minutes of exercise per week. I will reassess in four months.

## 2023-10-24 ENCOUNTER — Other Ambulatory Visit: Payer: Self-pay

## 2023-10-24 DIAGNOSIS — E1169 Type 2 diabetes mellitus with other specified complication: Secondary | ICD-10-CM

## 2023-10-24 MED ORDER — CONTOUR NEXT TEST VI STRP: ORAL_STRIP | 12 refills | Status: AC

## 2023-10-24 MED ORDER — ACCU-CHEK FASTCLIX LANCETS MISC: 0 refills | Status: DC

## 2023-11-09 ENCOUNTER — Other Ambulatory Visit: Payer: Self-pay | Admitting: Nurse Practitioner

## 2023-11-09 DIAGNOSIS — M791 Myalgia, unspecified site: Secondary | ICD-10-CM

## 2024-01-01 ENCOUNTER — Other Ambulatory Visit: Payer: Self-pay | Admitting: Internal Medicine

## 2024-01-01 NOTE — Telephone Encounter (Signed)
 Copied from CRM [id number]. Topic: Clinical - Medication Refill >> Jan 01, 2024  9:18 AM [name] wrote: Most Recent Primary Care Visit:  Provider: Dorothyann Peng  Department: [facility]  Visit Type: OFFICE VISIT  Date: 10/08/2023  Medication: sitaGLIPtin-metformin (JANUMET) 50-500 MG tablet   Has the patient contacted their pharmacy? Yes (Agent: If no, request that the patient contact the pharmacy for the refill. If patient does not wish to contact the pharmacy document the reason why and proceed with request.) (Agent: If yes, when and what did the pharmacy advise?)  Is this the correct pharmacy for this prescription? Yes If no, delete pharmacy and type the correct one.  This is the patient's preferred pharmacy:   [id number] [place], [place] - [address] [place] [address] Phone: [phone] Fax: [phone]   Has the prescription been filled recently? Yes  Is the patient out of the medication? Yes  Has the patient been seen for an appointment in the last year OR does the patient have an upcoming appointment? Yes  Can we respond through MyChart? Yes  Agent: Please be advised that Rx refills may take up to 3 business days. We ask that you follow-up with your pharmacy.

## 2024-01-06 DIAGNOSIS — D123 Benign neoplasm of transverse colon: Secondary | ICD-10-CM | POA: Diagnosis not present

## 2024-01-06 DIAGNOSIS — Z1211 Encounter for screening for malignant neoplasm of colon: Secondary | ICD-10-CM | POA: Diagnosis not present

## 2024-01-06 DIAGNOSIS — Z860100 Personal history of colon polyps, unspecified: Secondary | ICD-10-CM | POA: Diagnosis not present

## 2024-01-06 DIAGNOSIS — K635 Polyp of colon: Secondary | ICD-10-CM | POA: Diagnosis not present

## 2024-01-06 DIAGNOSIS — Z860101 Personal history of adenomatous and serrated colon polyps: Secondary | ICD-10-CM | POA: Diagnosis not present

## 2024-01-06 DIAGNOSIS — D125 Benign neoplasm of sigmoid colon: Secondary | ICD-10-CM | POA: Diagnosis not present

## 2024-01-06 LAB — HM COLONOSCOPY

## 2024-01-07 ENCOUNTER — Encounter: Payer: Self-pay | Admitting: Internal Medicine

## 2024-01-13 DIAGNOSIS — E119 Type 2 diabetes mellitus without complications: Secondary | ICD-10-CM | POA: Diagnosis not present

## 2024-01-13 DIAGNOSIS — H401111 Primary open-angle glaucoma, right eye, mild stage: Secondary | ICD-10-CM | POA: Diagnosis not present

## 2024-01-13 DIAGNOSIS — H35033 Hypertensive retinopathy, bilateral: Secondary | ICD-10-CM | POA: Diagnosis not present

## 2024-01-13 LAB — OPHTHALMOLOGY REPORT-SCANNED

## 2024-01-23 ENCOUNTER — Other Ambulatory Visit: Payer: Self-pay | Admitting: Internal Medicine

## 2024-01-23 DIAGNOSIS — R0789 Other chest pain: Secondary | ICD-10-CM

## 2024-02-05 ENCOUNTER — Ambulatory Visit (INDEPENDENT_AMBULATORY_CARE_PROVIDER_SITE_OTHER): Payer: 59 | Admitting: Internal Medicine

## 2024-02-05 ENCOUNTER — Encounter: Payer: Self-pay | Admitting: Internal Medicine

## 2024-02-05 ENCOUNTER — Telehealth: Payer: Self-pay | Admitting: Internal Medicine

## 2024-02-05 VITALS — BP 110/70 | HR 73 | Temp 98.3°F | Ht 66.0 in | Wt 165.4 lb

## 2024-02-05 DIAGNOSIS — L659 Nonscarring hair loss, unspecified: Secondary | ICD-10-CM | POA: Diagnosis not present

## 2024-02-05 DIAGNOSIS — E785 Hyperlipidemia, unspecified: Secondary | ICD-10-CM | POA: Diagnosis not present

## 2024-02-05 DIAGNOSIS — E1169 Type 2 diabetes mellitus with other specified complication: Secondary | ICD-10-CM | POA: Diagnosis not present

## 2024-02-05 DIAGNOSIS — I10 Essential (primary) hypertension: Secondary | ICD-10-CM

## 2024-02-05 DIAGNOSIS — R448 Other symptoms and signs involving general sensations and perceptions: Secondary | ICD-10-CM

## 2024-02-05 MED ORDER — CETIRIZINE HCL 10 MG PO TABS: 10.0000 mg | ORAL_TABLET | Freq: Every day | ORAL | 2 refills | Status: DC

## 2024-02-05 NOTE — Progress Notes (Signed)
 I,Jasmine Rodriguez, CMA,acting as a Neurosurgeon for Jasmine Dung, MD.,have documented all relevant documentation on the behalf of Jasmine Dung, MD,as directed by  Jasmine Dung, MD while in the presence of Jasmine Dung, MD.  Subjective:  Patient ID: Jasmine Rodriguez , female    DOB: 1956-03-15 , 68 y.o.   MRN: [id number]  Chief Complaint  Patient presents with   Hypertension    Patient presents today for bp, dm & cholesterol. She reports compliance with medications. Denies headache, chest pain & sob. She states hair thinning. This has been an ongoing issue for a while. She admits this issue is getting worse.  She also ate captain wafers grilled cheese crackers. After eating 2 crackers, she experienced tongue tingling/ itchiness. She noticed the tingling & itchiness lasted for a few days.  She has not yet scheduled mammogram.    Diabetes   Hyperlipidemia    HPI Discussed the use of AI scribe software for clinical note transcription with the patient, who gave verbal consent to proceed.  History of Present Illness Jasmine Rodriguez is a 68 year old female who presents for diabetes check.  Today, she is most concerned about hair thinning. She feels it has worsened over the past six months.   She has been experiencing hair thinning, particularly on the sides and back of her head, over the past six months. The crown remains full, but there is a one-inch area at the back with a different texture. She uses fluocinonide  for her scalp but does not take any collagen supplements. No family history of similar hair thinning is recalled.   She experienced tongue tingling after consuming Jasmine Rodriguez for the first time last evening. She did not take any antihistamines following the reaction and plans to avoid these crackers in the future.  Her blood sugar levels are monitored at home and are typically around 120 mg/dL or less, with recent readings of 121 mg/dL yesterday and 409 mg/dL.Jasmine Rodriguez  She has  not had a documented mammogram since 2022 and a bone density scan since 2021. She is due for both. She states recent studies are at Dr. Magalene Rodriguez office.   She takes a multivitamin and uses a black hair care product for moisturizing her hair, though she cannot recall the specific brand.   Diabetes She presents for her follow-up diabetic visit. She has type 2 diabetes mellitus. Pertinent negatives for hypoglycemia include no headaches. Pertinent negatives for diabetes include no blurred vision, no chest pain, no polydipsia, no polyphagia and no polyuria. There are no hypoglycemic complications. Risk factors for coronary artery disease include diabetes mellitus, obesity, dyslipidemia, sedentary lifestyle and post-menopausal. She is compliant with treatment most of the time. She is following a diabetic diet. She participates in exercise intermittently. An ACE inhibitor/angiotensin II receptor blocker is not being taken.  Hypertension This is a chronic problem. The current episode started more than 1 year ago. The problem has been gradually improving since onset. The problem is controlled. Pertinent negatives include no blurred vision, chest pain or headaches. Risk factors for coronary artery disease include diabetes mellitus, post-menopausal state and sedentary lifestyle. Past treatments include calcium channel blockers. The current treatment provides moderate improvement.     Past Medical History:  Diagnosis Date   DDD (degenerative disc disease)    DM (diabetes mellitus) (HCC)    GERD (gastroesophageal reflux disease)    High cholesterol    HTN (hypertension)      Family History  Problem Relation Age of Onset   Hypertension Mother    Arthritis Mother    Congestive Heart Failure Mother    Hypertension Father    Head & neck cancer Father      Current Outpatient Medications:    Accu-Chek FastClix Lancets MISC, USE AS DIRECTED TO CHECK BLOOD SUGARS 2 TIMES PER DAY., Disp: 102 each, Rfl: 0    acetaminophen (TYLENOL) 500 MG tablet, Take 500 mg by mouth every 6 (six) hours as needed., Disp: , Rfl:    albuterol  (PROAIR  HFA) 108 (90 Base) MCG/ACT inhaler, Inhale 2 puffs into the lungs every 6 (six) hours as needed for wheezing or shortness of breath., Disp: 18 g, Rfl: 3   amLODipine  (NORVASC ) 2.5 MG tablet, Take 1 tablet (2.5 mg total) by mouth daily., Disp: 90 tablet, Rfl: 1   Calcium Carbonate (CALTRATE 600 PO), Take by mouth. daily, Disp: , Rfl:    cetirizine (ZYRTEC ALLERGY) 10 MG tablet, Take 1 tablet (10 mg total) by mouth daily., Disp: 30 tablet, Rfl: 2   cholecalciferol (VITAMIN D ) 1000 UNITS tablet, Take 2,000 Units by mouth daily., Disp: , Rfl:    cyclobenzaprine  (FLEXERIL ) 10 MG tablet, TAKE 1 TABLET BY MOUTH AT BEDTIME AS NEEDED FOR MUSCLE SPASMS., Disp: 30 tablet, Rfl: 1   glucose blood (CONTOUR NEXT TEST) test strip, USE AS INSTRUCTED TO CHECK BLOOD SUGARS DAILY E11.69, Disp: 100 strip, Rfl: 12   lactase (LACTAID) 3000 UNITS tablet, Take 1 tablet by mouth 3 (three) times daily as needed. Dairy consumption, Disp: , Rfl:    lidocaine  (LIDODERM ) 5 %, Remove & Discard patch within 12 hours or as directed by MD, Disp: 90 patch, Rfl: 2   Microlet Lancets MISC, Use as directed to check blood sugars E11.69, Disp: 100 each, Rfl: 2   nystatin  (MYCOSTATIN /NYSTOP ) powder, USE AS DIRECTED, Disp: 60 g, Rfl: 1   omeprazole  (PRILOSEC) 20 MG capsule, TAKE 1 CAPSULE BY MOUTH EVERY DAY, Disp: 90 capsule, Rfl: 2   pravastatin  (PRAVACHOL ) 80 MG tablet, Take 1 tablet (80 mg total) by mouth every evening., Disp: 90 tablet, Rfl: 2   sitaGLIPtin -metformin  (JANUMET ) 50-500 MG tablet, TAKE 1 TABLET BY MOUTH once daily, Disp: 90 tablet, Rfl: 2   valACYclovir  (VALTREX ) 500 MG tablet, TAKE 1 TABLET BY MOUTH EVERY DAY, Disp: 30 tablet, Rfl: 5   fluocinonide  (LIDEX ) 0.05 % external solution, APPLY 1 ML TO THE AFFECTED AREA NIGHTLY AS NEEDED (Patient not taking: Reported on 02/05/2024), Disp: 60 mL, Rfl: 3    glucosamine-chondroitin 500-400 MG tablet, Take 1 tablet by mouth 2 (two) times daily.  (Patient not taking: Reported on 02/05/2024), Disp: , Rfl:    Allergies  Allergen Reactions   Skelaxin [Metaxalone] Anaphylaxis, Itching and Rash   Lisinopril      Review of Systems  Constitutional: Negative.   HENT:         Had tingling of tongue after eating Captain[employer] Wafers cheese crackers, occurred last night. Had never tried them in the past.   Eyes:  Negative for blurred vision.  Respiratory: Negative.    Cardiovascular: Negative.  Negative for chest pain.  Gastrointestinal: Negative.   Endocrine: Negative for polydipsia, polyphagia and polyuria.  Neurological: Negative.  Negative for headaches.  Psychiatric/Behavioral: Negative.       Today's Vitals   02/05/24 1137  BP: 110/70  Pulse: 73  Temp: 98.3 F (36.8 C)  SpO2: 98%  Weight: 165 lb 6.4 oz (75 kg)  Height: 5\' 6"  (1.676 m)  Body mass index is 26.7 kg/m.  Wt Readings from Last 3 Encounters:  02/05/24 165 lb 6.4 oz (75 kg)  10/08/23 165 lb (74.8 kg)  07/15/23 164 lb 9.6 oz (74.7 kg)     Objective:  Physical Exam Vitals and nursing note reviewed.  Constitutional:      Appearance: Normal appearance.  HENT:     Head: Normocephalic and atraumatic.  Eyes:     Extraocular Movements: Extraocular movements intact.  Cardiovascular:     Rate and Rhythm: Normal rate and regular rhythm.     Heart sounds: Normal heart sounds.  Pulmonary:     Effort: Pulmonary effort is normal.     Breath sounds: Normal breath sounds.  Musculoskeletal:     Cervical back: Normal range of motion.  Skin:    General: Skin is warm.     Comments: Areas of hair thinning behind ears  Neurological:     General: No focal deficit present.     Mental Status: She is alert.  Psychiatric:        Mood and Affect: Mood normal.        Behavior: Behavior normal.       Assessment And Plan:  Essential hypertension, benign Assessment & Plan: Chronic, well  controlled.  She will continue with amlodipine  2.5mg  daily. Reminded to follow low sodium diet and to aim for at least 150 minutes of exercise per week. I will reassess in four months. She has allergy to ACE-inhibitors.   Orders: -     CMP14+EGFR  Type 2 diabetes mellitus with hyperlipidemia (HCC) Assessment & Plan: Chronic, she will continue with Janumet  50/500mg  once daily. Importance of dietary/exercise compliance was discussed with the patient. She will f/u in four months for re-evaluation. LDL goal is less than 70. She will continue with pravastatin  80mg  daily.   Orders: -     CMP14+EGFR -     Hemoglobin A1c  Paresthesia of tongue Assessment & Plan: Experienced tongue tingling after consuming Jasmine Rodriguez for the first time.  This is a possible allergic reaction.  - Avoid Captain Wafers. - Administer antihistamines like diphenhydramine if allergic reaction occurs in the future.  Orders: -     Vitamin B12  Hair thinning Assessment & Plan: Hair thinning on sides and back of scalp with distinct texture change. - Order CBC, iron panel, and ANA. - Refer to dermatologist. - Discontinue fluocinonide  nightly use.  Reminded this is a prn medication. Should use the night before wash day - Consider iron supplementation if iron levels are low. - Re-evaluate in three months.  Orders: -     Iron, TIBC and Ferritin Panel -     CBC -     ANA, IFA (with reflex) -     Ambulatory referral to Dermatology  Other orders -     Cetirizine HCl; Take 1 tablet (10 mg total) by mouth daily.  Dispense: 30 tablet; Refill: 2  General Wellness Discussed need for mammogram and bone density scan. - Request mammogram records from Dr. Lee Public office. - Request bone density scan records. - Schedule mammogram. - Schedule bone density scan if needed  Return if symptoms worsen or fail to improve.  Patient was given opportunity to ask questions. Patient verbalized understanding of the plan and was  able to repeat key elements of the plan. All questions were answered to their satisfaction.    I, Jasmine Dung, MD, have reviewed all documentation for this visit. The documentation on 02/05/24 for  the exam, diagnosis, procedures, and orders are all accurate and complete.   IF YOU HAVE BEEN REFERRED TO A SPECIALIST, IT MAY TAKE 1-2 WEEKS TO SCHEDULE/PROCESS THE REFERRAL. IF YOU HAVE NOT HEARD FROM US /SPECIALIST IN TWO WEEKS, PLEASE GIVE US  A CALL AT [phone].   THE PATIENT IS ENCOURAGED TO PRACTICE SOCIAL DISTANCING DUE TO THE COVID-19 PANDEMIC.

## 2024-02-05 NOTE — Assessment & Plan Note (Addendum)
 Experienced tongue tingling after consuming Jasmine Rodriguez for the first time.  This is a possible allergic reaction.  - Avoid Captain Wafers. - Administer antihistamines like diphenhydramine if allergic reaction occurs in the future.

## 2024-02-05 NOTE — Patient Instructions (Addendum)
 Leaky gut  Drink bone broth to help address leaky gut which can result in food allergies/inflammation   Hypertension, Adult Hypertension is another name for high blood pressure. High blood pressure forces your heart to work harder to pump blood. This can cause problems over time. There are two numbers in a blood pressure reading. There is a top number (systolic) over a bottom number (diastolic). It is best to have a blood pressure that is below 120/80. What are the causes? The cause of this condition is not known. Some other conditions can lead to high blood pressure. What increases the risk? Some lifestyle factors can make you more likely to develop high blood pressure: Smoking. Not getting enough exercise or physical activity. Being overweight. Having too much fat, sugar, calories, or salt (sodium) in your diet. Drinking too much alcohol. Other risk factors include: Having any of these conditions: Heart disease. Diabetes. High cholesterol. Kidney disease. Obstructive sleep apnea. Having a family history of high blood pressure and high cholesterol. Age. The risk increases with age. Stress. What are the signs or symptoms? High blood pressure may not cause symptoms. Very high blood pressure (hypertensive crisis) may cause: Headache. Fast or uneven heartbeats (palpitations). Shortness of breath. Nosebleed. Vomiting or feeling like you may vomit (nauseous). Changes in how you see. Very bad chest pain. Feeling dizzy. Seizures. How is this treated? This condition is treated by making healthy lifestyle changes, such as: Eating healthy foods. Exercising more. Drinking less alcohol. Your doctor may prescribe medicine if lifestyle changes do not help enough and if: Your top number is above 130. Your bottom number is above 80. Your personal target blood pressure may vary. Follow these instructions at home: Eating and drinking  If told, follow the DASH eating plan. To follow  this plan: Fill one half of your plate at each meal with fruits and vegetables. Fill one fourth of your plate at each meal with whole grains. Whole grains include whole-wheat pasta, brown rice, and whole-grain bread. Eat or drink low-fat dairy products, such as skim milk or low-fat yogurt. Fill one fourth of your plate at each meal with low-fat (lean) proteins. Low-fat proteins include fish, chicken without skin, eggs, beans, and tofu. Avoid fatty meat, cured and processed meat, or chicken with skin. Avoid pre-made or processed food. Limit the amount of salt in your diet to less than 1,500 mg each day. Do not drink alcohol if: Your doctor tells you not to drink. You are pregnant, may be pregnant, or are planning to become pregnant. If you drink alcohol: Limit how much you have to: 0-1 drink a day for women. 0-2 drinks a day for men. Know how much alcohol is in your drink. In the [place] one drink equals one 12 oz bottle of beer (355 mL), one 5 oz glass of wine (148 mL), or one 1 oz glass of hard liquor (44 mL). Lifestyle  Work with your doctor to stay at a healthy weight or to lose weight. Ask your doctor what the best weight is for you. Get at least 30 minutes of exercise that causes your heart to beat faster (aerobic exercise) most days of the week. This may include walking, swimming, or biking. Get at least 30 minutes of exercise that strengthens your muscles (resistance exercise) at least 3 days a week. This may include lifting weights or doing Pilates. Do not smoke or use any products that contain nicotine or tobacco. If you need help quitting, ask your doctor. Check your  blood pressure at home as told by your doctor. Keep all follow-up visits. Medicines Take over-the-counter and prescription medicines only as told by your doctor. Follow directions carefully. Do not skip doses of blood pressure medicine. The medicine does not work as well if you skip doses. Skipping doses also puts you  at risk for problems. Ask your doctor about side effects or reactions to medicines that you should watch for. Contact a doctor if: You think you are having a reaction to the medicine you are taking. You have headaches that keep coming back. You feel dizzy. You have swelling in your ankles. You have trouble with your vision. Get help right away if: You get a very bad headache. You start to feel mixed up (confused). You feel weak or numb. You feel faint. You have very bad pain in your: Chest. Belly (abdomen). You vomit more than once. You have trouble breathing. These symptoms may be an emergency. Get help right away. Call 911. Do not wait to see if the symptoms will go away. Do not drive yourself to the hospital. Summary Hypertension is another name for high blood pressure. High blood pressure forces your heart to work harder to pump blood. For most people, a normal blood pressure is less than 120/80. Making healthy choices can help lower blood pressure. If your blood pressure does not get lower with healthy choices, you may need to take medicine. This information is not intended to replace advice given to you by your health care provider. Make sure you discuss any questions you have with your health care provider. Document Revised: 07/06/2021 Document Reviewed: 07/06/2021 Elsevier Patient Education  2024 [employer].

## 2024-02-05 NOTE — Assessment & Plan Note (Signed)
 Hair thinning on sides and back of scalp with distinct texture change. - Order CBC, iron panel, and ANA. - Refer to dermatologist. - Discontinue fluocinonide  nightly use.  Reminded this is a prn medication. Should use the night before wash day - Consider iron supplementation if iron levels are low. - Re-evaluate in three months.

## 2024-02-05 NOTE — Assessment & Plan Note (Addendum)
 Chronic, well controlled.  She will continue with amlodipine  2.5mg  daily. Reminded to follow low sodium diet and to aim for at least 150 minutes of exercise per week. I will reassess in four months. She has allergy to ACE-inhibitors.

## 2024-02-05 NOTE — Assessment & Plan Note (Signed)
 Chronic, she will continue with Janumet  50/500mg  once daily. Importance of dietary/exercise compliance was discussed with the patient. She will f/u in four months for re-evaluation. LDL goal is less than 70. She will continue with pravastatin  80mg  daily.

## 2024-02-07 NOTE — Telephone Encounter (Signed)
 error

## 2024-02-12 LAB — CBC
Hematocrit: 40.9 % (ref 34.0–46.6)
Hemoglobin: 13 g/dL (ref 11.1–15.9)
MCH: 30 pg (ref 26.6–33.0)
MCHC: 31.8 g/dL (ref 31.5–35.7)
MCV: 94 fL (ref 79–97)
Platelets: 223 10*3/uL (ref 150–450)
RBC: 4.34 x10E6/uL (ref 3.77–5.28)
RDW: 13.1 % (ref 11.7–15.4)
WBC: 8.5 10*3/uL (ref 3.4–10.8)

## 2024-02-12 LAB — IRON,TIBC AND FERRITIN PANEL
Ferritin: 44 ng/mL (ref 15–150)
Iron Saturation: 15 % (ref 15–55)
Iron: 50 ug/dL (ref 27–139)
Total Iron Binding Capacity: 337 ug/dL (ref 250–450)
UIBC: 287 ug/dL (ref 118–369)

## 2024-02-12 LAB — CMP14+EGFR
ALT: 16 IU/L (ref 0–32)
AST: 18 IU/L (ref 0–40)
Albumin: 4.3 g/dL (ref 3.9–4.9)
Alkaline Phosphatase: 80 IU/L (ref 44–121)
BUN/Creatinine Ratio: 12 (ref 12–28)
BUN: 11 mg/dL (ref 8–27)
Bilirubin Total: 0.3 mg/dL (ref 0.0–1.2)
CO2: 22 mmol/L (ref 20–29)
Calcium: 9.8 mg/dL (ref 8.7–10.3)
Chloride: 103 mmol/L (ref 96–106)
Creatinine, Ser: 0.91 mg/dL (ref 0.57–1.00)
Globulin, Total: 2.4 g/dL (ref 1.5–4.5)
Glucose: 73 mg/dL (ref 70–99)
Potassium: 4 mmol/L (ref 3.5–5.2)
Sodium: 143 mmol/L (ref 134–144)
Total Protein: 6.7 g/dL (ref 6.0–8.5)
eGFR: 70 mL/min/{1.73_m2} (ref >59)

## 2024-02-12 LAB — VITAMIN B12: Vitamin B-12: 891 pg/mL (ref 232–1245)

## 2024-02-12 LAB — ANTINUCLEAR ANTIBODIES, IFA: ANA Titer 1: NEGATIVE

## 2024-02-12 LAB — HEMOGLOBIN A1C
Est. average glucose Bld gHb Est-mCnc: 126 mg/dL
Hgb A1c MFr Bld: 6 % — ABNORMAL HIGH (ref 4.8–5.6)

## 2024-02-13 ENCOUNTER — Ambulatory Visit: Payer: Self-pay | Admitting: Internal Medicine

## 2024-02-13 DIAGNOSIS — E785 Hyperlipidemia, unspecified: Secondary | ICD-10-CM

## 2024-02-18 ENCOUNTER — Ambulatory Visit: Payer: Self-pay

## 2024-02-18 NOTE — Telephone Encounter (Signed)
 Copied from CRM [id number]. Topic: Clinical - Red Word Triage >> Feb 18, 2024  1:24 PM [place] wrote: Red Word that prompted transfer to Nurse Triage: Thursh- sore throat, tongue covered white, very painful.  Chief Complaint: Oral thrush Symptoms: Tongue and throat pain, white patches on tongue Frequency: A couple days ago Pertinent Negatives: Patient denies difficulty swallowing Disposition: [] ED /[] Urgent Care (no appt availability in office) / [x] Appointment(In office/virtual)/ []  [facility]/ [] Home Care/ [] Refused Recommended Disposition /[] [facility]/ []  Follow-up with PCP Additional Notes: Patient called in to report tongue/throat pain and white patches on her tongue. Patient believes cause to be oral thrush. Patient denied difficulty breathing and fever. Advised patient to be seen within 3 days, per protocol. No availability with PCP. Scheduled with alternate provider in office. Provided care advice and instructed patient to call back if symptoms worsen. Patient complied.   Reason for Disposition  [1] White patches that stick to tongue or inner cheek AND [2] can be wiped off  Answer Assessment - Initial Assessment Questions 1. SYMPTOM: "What's the main symptom you're concerned about?" (e.g., chapped lips, dry mouth, lump, sores)     Pain on tongue, white patches on tongue 2. ONSET: "When did the symptoms start?"     A couple days ago 3. PAIN: "Is there any pain?" If Yes, ask: "How bad is it?" (Scale: 1-10; mild, moderate, severe)   - MILD (1-3):  doesn't interfere with eating or normal activities   - MODERATE (4-7): interferes with eating some solids and normal activities   - SEVERE (8-10):  excruciating pain, interferes with most normal activities   - SEVERE DYSPHAGIA: can't swallow liquids, drooling     Rates tongue pain a 5-6 4. CAUSE: "What do you think is causing the symptoms?"     Oral thrush 5. OTHER SYMPTOMS: "Do you have any other symptoms?" (e.g.,  fever, sore throat, toothache, swelling)     Sore throat, denies difficulty swallowing- states there is a "dryness" when she swallows, denies fever  Protocols used: Mouth Symptoms-A-AH

## 2024-02-20 ENCOUNTER — Encounter: Payer: Self-pay | Admitting: Family Medicine

## 2024-02-20 ENCOUNTER — Ambulatory Visit: Admitting: Family Medicine

## 2024-02-20 VITALS — BP 120/82 | HR 77 | Temp 98.7°F | Ht 66.0 in | Wt 164.4 lb

## 2024-02-20 DIAGNOSIS — B370 Candidal stomatitis: Secondary | ICD-10-CM | POA: Insufficient documentation

## 2024-02-20 MED ORDER — NYSTATIN 100000 UNIT/ML MT SUSP: 10.0000 mL | Freq: Three times a day (TID) | OROMUCOSAL | 0 refills | Status: AC | PRN

## 2024-02-20 MED ORDER — NYSTATIN 100000 UNIT/ML MT SUSP: 10.0000 mL | Freq: Three times a day (TID) | OROMUCOSAL | 0 refills | Status: DC | PRN

## 2024-02-20 NOTE — Progress Notes (Signed)
 I,Jameka J Llittleton, CMA,acting as a Neurosurgeon for [employer], NP.,have documented all relevant documentation on the behalf of Melodie Spry, NP,as directed by  Melodie Spry, NP while in the presence of Melodie Spry, NP.  Subjective:  Patient ID: Jasmine Rodriguez , female    DOB: 1956-08-02 , 68 y.o.   MRN: [id number]  Chief Complaint  Patient presents with  . Acute Visit    Patient presents for white patches on tongue and tongue pain. Patient denies any headaches,chest pain or Sob     HPI  Patient is a 68 year old female who presents with pain and soreness of the tongue that started since Sunday, She states she also noticed some white patches but it has gotten better since then. She denies the use of any medication or taking anything new.    Past Medical History:  Diagnosis Date  . DDD (degenerative disc disease)   . DM (diabetes mellitus) (HCC)   . GERD (gastroesophageal reflux disease)   . High cholesterol   . HTN (hypertension)      Family History  Problem Relation Age of Onset  . Hypertension Mother   . Arthritis Mother   . Congestive Heart Failure Mother   . Hypertension Father   . Head & neck cancer Father      Current Outpatient Medications:  .  Accu-Chek FastClix Lancets MISC, USE AS DIRECTED TO CHECK BLOOD SUGARS 2 TIMES PER DAY., Disp: 102 each, Rfl: 0 .  acetaminophen (TYLENOL) 500 MG tablet, Take 500 mg by mouth every 6 (six) hours as needed., Disp: , Rfl:  .  albuterol  (PROAIR  HFA) 108 (90 Base) MCG/ACT inhaler, Inhale 2 puffs into the lungs every 6 (six) hours as needed for wheezing or shortness of breath., Disp: 18 g, Rfl: 3 .  amLODipine  (NORVASC ) 2.5 MG tablet, Take 1 tablet (2.5 mg total) by mouth daily., Disp: 90 tablet, Rfl: 1 .  Calcium Carbonate (CALTRATE 600 PO), Take by mouth. daily, Disp: , Rfl:  .  cholecalciferol (VITAMIN D ) 1000 UNITS tablet, Take 2,000 Units by mouth daily., Disp: , Rfl:  .  glucose blood (CONTOUR NEXT TEST) test strip, USE AS  INSTRUCTED TO CHECK BLOOD SUGARS DAILY E11.69, Disp: 100 strip, Rfl: 12 .  lactase (LACTAID) 3000 UNITS tablet, Take 1 tablet by mouth 3 (three) times daily as needed. Dairy consumption, Disp: , Rfl:  .  Microlet Lancets MISC, Use as directed to check blood sugars E11.69, Disp: 100 each, Rfl: 2 .  nystatin  (MYCOSTATIN /NYSTOP ) powder, USE AS DIRECTED, Disp: 60 g, Rfl: 1 .  omeprazole  (PRILOSEC) 20 MG capsule, TAKE 1 CAPSULE BY MOUTH EVERY DAY, Disp: 90 capsule, Rfl: 2 .  pravastatin  (PRAVACHOL ) 80 MG tablet, Take 1 tablet (80 mg total) by mouth every evening., Disp: 90 tablet, Rfl: 2 .  sitaGLIPtin -metformin  (JANUMET ) 50-500 MG tablet, TAKE 1 TABLET BY MOUTH once daily, Disp: 90 tablet, Rfl: 2 .  valACYclovir  (VALTREX ) 500 MG tablet, TAKE 1 TABLET BY MOUTH EVERY DAY, Disp: 30 tablet, Rfl: 5 .  cetirizine  (ZYRTEC  ALLERGY) 10 MG tablet, Take 1 tablet (10 mg total) by mouth daily. (Patient not taking: Reported on 02/20/2024), Disp: 30 tablet, Rfl: 2 .  cyclobenzaprine  (FLEXERIL ) 10 MG tablet, TAKE 1 TABLET BY MOUTH AT BEDTIME AS NEEDED FOR MUSCLE SPASMS. (Patient not taking: Reported on 02/20/2024), Disp: 30 tablet, Rfl: 1 .  fluocinonide  (LIDEX ) 0.05 % external solution, APPLY 1 ML TO THE AFFECTED AREA NIGHTLY AS NEEDED (Patient not taking: Reported  on 02/20/2024), Disp: 60 mL, Rfl: 3 .  glucosamine-chondroitin 500-400 MG tablet, Take 1 tablet by mouth 2 (two) times daily.  (Patient not taking: Reported on 02/05/2024), Disp: , Rfl:  .  magic mouthwash (nystatin , lidocaine , diphenhydrAMINE, alum & mag hydroxide) suspension, Swish and spit 10 mLs 3 (three) times daily as needed for up to 6 days., Disp: 180 mL, Rfl: 0   Allergies  Allergen Reactions  . Skelaxin [Metaxalone] Anaphylaxis, Itching and Rash  . Lisinopril      Review of Systems  Constitutional: Negative.   HENT:  Positive for mouth sores.   Respiratory: Negative.    Cardiovascular: Negative.   Skin: Negative.   Psychiatric/Behavioral:  Negative.       Today's Vitals   02/20/24 1158  BP: 120/82  Pulse: 77  Temp: 98.7 F (37.1 C)  SpO2: 98%  Weight: 164 lb 6.4 oz (74.6 kg)  Height: 5\' 6"  (1.676 m)   Body mass index is 26.53 kg/m.  Wt Readings from Last 3 Encounters:  02/20/24 164 lb 6.4 oz (74.6 kg)  02/05/24 165 lb 6.4 oz (75 kg)  10/08/23 165 lb (74.8 kg)    The 10-year ASCVD risk score (Arnett DK, et al., 2019) is: 14.2%   Values used to calculate the score:     Age: 50 years     Sex: Female     Is Non-Hispanic African American: Yes     Diabetic: Yes     Tobacco smoker: No     Systolic Blood Pressure: 120 mmHg     Is BP treated: Yes     HDL Cholesterol: 76 mg/dL     Total Cholesterol: 167 mg/dL  Objective:  Physical Exam HENT:     Head: Normocephalic.     Mouth/Throat:     Tongue: Lesions present.     Comments: White patches on the tongue Pulmonary:     Effort: Pulmonary effort is normal.     Breath sounds: Normal breath sounds.  Skin:    General: Skin is warm and dry.  Neurological:     General: No focal deficit present.     Mental Status: She is alert and oriented to person, place, and time.        Assessment And Plan:  Thrush -     magic mouthwash (nystatin , lidocaine , diphenhydrAMINE, alum & mag hydroxide) suspension; Swish and spit 10 mLs 3 (three) times daily as needed for up to 6 days.  Dispense: 180 mL; Refill: 0    Return if symptoms worsen or fail to improve, for Keep next Appt.  Patient was given opportunity to ask questions. Patient verbalized understanding of the plan and was able to repeat key elements of the plan. All questions were answered to their satisfaction.    I, Melodie Spry, NP, have reviewed all documentation for this visit. The documentation on 02/20/24 for the exam, diagnosis, procedures, and orders are all accurate and complete.   IF YOU HAVE BEEN REFERRED TO A SPECIALIST, IT MAY TAKE 1-2 WEEKS TO SCHEDULE/PROCESS THE REFERRAL. IF YOU HAVE NOT HEARD FROM  US /SPECIALIST IN TWO WEEKS, PLEASE GIVE US  A CALL AT [phone].

## 2024-02-21 ENCOUNTER — Ambulatory Visit: Payer: Self-pay

## 2024-02-21 NOTE — Telephone Encounter (Signed)
 3rd attempt. Routing for no contact follow up.  Reason for Disposition . Third attempt to contact caller AND no contact made. Phone number verified.  Protocols used: No Contact or Duplicate Contact Call-A-AH

## 2024-02-21 NOTE — Telephone Encounter (Addendum)
 Copied from CRM [id number]. Topic: Clinical - Medication Question >> Feb 21, 2024  3:47 PM Rosamond Comes wrote: Reason for CRM: patient calling asking how long the course thrush will last. Tongue is sore  Patient phone [phone] First attempt made; no answer.  2nd attempt; no answer.

## 2024-02-21 NOTE — Telephone Encounter (Signed)
 Patient called with questions about resolution of oral thrush as she has completed current oral meds.  Explained that RX of Magic Mouthwash had [facility].  Educated on benefits of Magic Mouthwash Verbalizes understanding and will pick up mouthwash from pharmacy Reason for Disposition  Caller has medicine question only, adult not sick, AND triager answers question  Answer Assessment - Initial Assessment Questions 1. REASON FOR CALL or QUESTION: "What is your reason for calling today?" or "How can I best help you?" or "What question do you have that I can help answer?"     Medication question  Protocols used: Information Only Call - No Triage-A-AH, Medication Question Call-A-AH

## 2024-02-22 ENCOUNTER — Other Ambulatory Visit: Payer: Self-pay | Admitting: Family Medicine

## 2024-02-24 ENCOUNTER — Other Ambulatory Visit: Payer: Self-pay | Admitting: Internal Medicine

## 2024-02-25 ENCOUNTER — Other Ambulatory Visit: Payer: Self-pay | Admitting: Internal Medicine

## 2024-02-25 DIAGNOSIS — I10 Essential (primary) hypertension: Secondary | ICD-10-CM

## 2024-02-25 DIAGNOSIS — E1169 Type 2 diabetes mellitus with other specified complication: Secondary | ICD-10-CM

## 2024-02-25 DIAGNOSIS — E782 Mixed hyperlipidemia: Secondary | ICD-10-CM

## 2024-02-26 ENCOUNTER — Telehealth: Payer: Self-pay

## 2024-02-26 NOTE — Telephone Encounter (Signed)
 Patient called in regards to having thrush and the treatment she received last week not agreeing with her. I spoke with her today and advised her to do the ACV rinse per Dr.Sanders. Patient agreed to treatment plan.YL,RMA

## 2024-02-26 NOTE — Telephone Encounter (Signed)
Rx benefits verified YL,RMA

## 2024-02-27 ENCOUNTER — Telehealth: Payer: Self-pay

## 2024-02-27 ENCOUNTER — Telehealth: Payer: Self-pay | Admitting: Pharmacist

## 2024-02-27 DIAGNOSIS — E785 Hyperlipidemia, unspecified: Secondary | ICD-10-CM

## 2024-02-27 NOTE — Progress Notes (Signed)
 02/27/2024 Name: Jasmine Rodriguez MRN: [id number] DOB: Jul 01, 1956  Chief Complaint  Patient presents with   Medication Assistance    Janumet     Jasmine Rodriguez is a 68 y.o. year old female who presented for a telephone visit.   They were referred to the pharmacist by their PCP for assistance in managing medication access. Janumet  50/500   Subjective: Patient is a 68 year old female with hyperlipidemia, type 2 diabetes, hypertension, and neuropathy   Care Team: Primary Care Provider: Cleave Curling, MD ; Next Scheduled Visit: 06/10/2024   Medication Access/Adherence  Current Pharmacy:  [id number], [place] - [address] [facility] [address] [place] [address] Phone: [phone] Fax: [phone]  [place] [facility] [address] [place] [address] Phone: [phone] Fax: [phone]   Patient reports affordability concerns with their medications: Yes  Janumet  Patient reports access/transportation concerns to their pharmacy: No  Patient reports adherence concerns with their medications:  Yes  Janumet -due to cost   Diabetes:  Current medications:  Medications tried in the past:   Current glucose readings:   Using Accu Chek meter; testing 1  time per day  Reported blood sugars average:    Patient reports hypoglycemic s/sx including  dizziness, shakiness, and nausea.  Patient denies hyperglycemic symptoms including  polyuria, polydipsia, polyphagia, nocturia, neuropathy, blurred vision.  Current meal patterns:  - Breakfast: oatmeal with walnuts,  - Lunch varies-peanut butter banana, peanut butter crackers, soup, Chick fil A - Supper baked chicken, fresh veggies, canned green beans and corn, fresh broccoli - Drinks Water, Tea (less sugar than before)  Current physical activity: gardening, cutting grass, walking   Objective:  Lab Results  Component Value Date    HGBA1C 6.0 (H) 02/05/2024    Lab Results  Component Value Date   CREATININE 0.91 02/05/2024   BUN 11 02/05/2024   NA 143 02/05/2024   K 4.0 02/05/2024   CL 103 02/05/2024   CO2 22 02/05/2024    Lab Results  Component Value Date   CHOL 167 07/15/2023   HDL 76 07/15/2023   LDLCALC 78 07/15/2023   TRIG 66 07/15/2023   CHOLHDL 2.2 07/15/2023    Medications Reviewed Today     Reviewed by Geronimo Krabbe, RPH (Pharmacist) on 02/27/24 at 1245  Med List Status: <None>   Medication Order Taking? Sig Documenting Provider Last Dose Status Informant  Accu-Chek FastClix Lancets MISC [id number] Yes USE AS DIRECTED TO CHECK BLOOD SUGARS 2 TIMES PER DAY. Cleave Curling, MD Taking Active   acetaminophen (TYLENOL) 500 MG tablet [id number] Yes Take 500 mg by mouth every 6 (six) hours as needed. [provider] Taking Active   albuterol  (PROAIR  HFA) 108 (90 Base) MCG/ACT inhaler [id number] Yes Inhale 2 puffs into the lungs every 6 (six) hours as needed for wheezing or shortness of breath. Cleave Curling, MD Taking Active   amLODipine  (NORVASC ) 2.5 MG tablet [id number] Yes Take 1 tablet (2.5 mg total) by mouth daily. Cleave Curling, MD Taking Active   Calcium Carbonate (CALTRATE 600 PO) [id number] No Take by mouth. daily  Patient not taking: Reported on 02/27/2024   [provider] Not Taking Active   cetirizine  (ZYRTEC  ALLERGY) 10 MG tablet [id number] Yes Take 1 tablet (10 mg total) by mouth daily.  Patient taking differently: Take 10 mg by mouth daily. PRN   Cleave Curling, MD Taking Active   cyclobenzaprine  (FLEXERIL )  10 MG tablet [id number]  TAKE 1 TABLET BY MOUTH AT BEDTIME AS NEEDED FOR MUSCLE SPASMS.  Patient not taking: Reported on 02/20/2024   Susanna Epley, FNP  Active   fluocinonide  (LIDEX ) 0.05 % external solution [id number]  APPLY 1 ML TO THE AFFECTED AREA NIGHTLY AS NEEDED  Patient not taking: Reported on 02/20/2024   Cleave Curling, MD  Active   glucosamine-chondroitin  500-400 MG tablet [id number]  Take 1 tablet by mouth 2 (two) times daily.   Patient not taking: Reported on 02/05/2024   [provider]  Active Self  glucose blood (CONTOUR NEXT TEST) test strip [id number] Yes USE AS INSTRUCTED TO CHECK BLOOD SUGARS DAILY E11.69 Cleave Curling, MD Taking Active   lactase (LACTAID) 3000 UNITS tablet [id number] Yes Take 1 tablet by mouth 3 (three) times daily as needed. Dairy consumption [provider] Taking Active Self  Microlet Lancets MISC [id number] Yes Use as directed to check blood sugars E11.69 Cleave Curling, MD Taking Active   Multiple Vitamins-Minerals (MULTIPLE VITAMINS/WOMENS PO) [id number] Yes Take 1 tablet by mouth daily. [provider] Taking Active   nystatin  (MYCOSTATIN /NYSTOP ) powder [id number] Yes USE AS DIRECTED Cleave Curling, MD Taking Active   omeprazole  (PRILOSEC) 20 MG capsule [id number] Yes TAKE 1 CAPSULE BY MOUTH EVERY DAY Cleave Curling, MD Taking Active   pravastatin  (PRAVACHOL ) 80 MG tablet [id number] Yes Take 1 tablet (80 mg total) by mouth every evening. Cleave Curling, MD Taking Active   sitaGLIPtin -metformin  (JANUMET ) 50-500 MG tablet [id number] Yes TAKE 1 TABLET BY MOUTH once daily Cleave Curling, MD Taking Active   valACYclovir  (VALTREX ) 500 MG tablet [id number] Yes TAKE 1 TABLET BY MOUTH EVERY DAY Cleave Curling, MD Taking Active               Assessment/Plan:   Diabetes: - Currently controlled A1c-6% - Reviewed long term cardiovascular and renal outcomes of uncontrolled blood sugar - Reviewed goal A1c, goal fasting, and goal 2 hour post prandial glucose - Recommend to continue current therapy - Meets financial criteria for Janumet  patient assistance program through [employer]. Will collaborate with provider, CPhT, and patient to pursue assistance.   Hypertension: - Currently controlled 110/70 at 02/05/24 office visit - Recommend to continue current therapy  (amlodipine  2.5 mg 1 tablet  daily)  Hyperlipidemia/ASCVD Risk Reduction: - Currently controlled.  LDL from 1024 was 78 - Recommend to continue current therapy --Pravastatin  80 mg 1 tablet daily   Follow Up Plan:    Route note to RX Med Assistance Team for Palmer Bobo, CPhT    Geronimo Krabbe, PharmD, [facility] Clinical Pharmacist [phone]

## 2024-02-27 NOTE — Telephone Encounter (Signed)
 PAP: Patient assistance application for Janumet  through [employer] has been mailed to pt's home address on file. Provider portion of application will be faxed to provider's office.

## 2024-03-02 NOTE — Telephone Encounter (Signed)
 Received Provider portion of PAP application Janumet  ([employer]) - waiting on Patient to return PAP application

## 2024-03-09 ENCOUNTER — Telehealth: Payer: Self-pay | Admitting: Pharmacist

## 2024-03-09 ENCOUNTER — Ambulatory Visit: Payer: Self-pay

## 2024-03-09 DIAGNOSIS — E1169 Type 2 diabetes mellitus with other specified complication: Secondary | ICD-10-CM

## 2024-03-09 NOTE — Telephone Encounter (Signed)
 FYI Only or Action Required?: FYI only for provider  Patient was last seen in primary care on 02/20/2024 by Melodie Spry, NP. Called Nurse Triage reporting Thrush. Symptoms began several weeks ago. Interventions attempted: Rest, hydration, or home remedies. Symptoms are: unchanged.  Triage Disposition: See PCP When Office is Open (Within 3 Days)  Patient/caregiver understands and will follow disposition?: Yes   Copied from CRM [id number]. Topic: Clinical - Red Word Triage >> Mar 09, 2024  2:32 PM Baldomero Bone wrote: Red Word that prompted transfer to Nurse Triage: Thrush, gets better, and then get bad again. Baking soda rinse is not helping. Tongue coated white, itching and burning. Callback is [phone] Reason for Disposition  [1] Dry mouth AND [2] new-onset AND [3] unexplained (Exceptions: chronic symptom or dry mouth from mild dehydration)  Answer Assessment - Initial Assessment Questions 1. SYMPTOM: "What's the main symptom you're concerned about?" (e.g., chapped lips, dry mouth, lump, sores)     White coating, itchy, and burning  2. ONSET: "When did the  thrush   start?"     3-4 days  3. PAIN: "Is there any pain?" If Yes, ask: "How bad is it?" (Scale: 1-10; mild, moderate, severe)   - MILD (1-3):  doesn't interfere with eating or normal activities   - MODERATE (4-7): interferes with eating some solids and normal activities   - SEVERE (8-10):  excruciating pain, interferes with most normal activities   - SEVERE DYSPHAGIA: can't swallow liquids, drooling     No pain  4. CAUSE: "What do you think is causing the symptoms?"     Unsure of cause  5. OTHER SYMPTOMS: "Do you have any other symptoms?" (e.g., fever, sore throat, toothache, swelling)     Poor appetite  6. PREGNANCY: "Is there any chance you are pregnant?" "When was your last menstrual period?"     no  Protocols used: Mouth Symptoms-A-AH

## 2024-03-09 NOTE — Progress Notes (Signed)
   03/09/2024  Patient ID: Jasmine Rodriguez, female   DOB: [birth year], 68 y.o.   MRN: [id number] Pharmacy Quality Measure Review  This patient is appearing on a report for being at risk of failing the adherence measure for diabetes medications this calendar year.   Medication: Janumet  Last fill date: 02/26/24 for 30 day supply  Patient has been sent an application for Janumet  Patient Assistance through [employer].   Plan: Patient will pass for now but if patient assistance is approved, will most likely fail.   Geronimo Krabbe, PharmD, BCACP Clinical Pharmacist [phone]

## 2024-03-10 ENCOUNTER — Ambulatory Visit (INDEPENDENT_AMBULATORY_CARE_PROVIDER_SITE_OTHER): Payer: Self-pay | Admitting: Internal Medicine

## 2024-03-10 ENCOUNTER — Encounter: Payer: Self-pay | Admitting: Internal Medicine

## 2024-03-10 ENCOUNTER — Other Ambulatory Visit: Payer: Self-pay | Admitting: Internal Medicine

## 2024-03-10 VITALS — BP 124/78 | HR 91 | Temp 98.2°F | Ht 66.0 in | Wt 160.0 lb

## 2024-03-10 DIAGNOSIS — B370 Candidal stomatitis: Secondary | ICD-10-CM | POA: Diagnosis not present

## 2024-03-10 DIAGNOSIS — Z599 Problem related to housing and economic circumstances, unspecified: Secondary | ICD-10-CM | POA: Diagnosis not present

## 2024-03-10 DIAGNOSIS — R634 Abnormal weight loss: Secondary | ICD-10-CM | POA: Insufficient documentation

## 2024-03-10 DIAGNOSIS — Z638 Other specified problems related to primary support group: Secondary | ICD-10-CM | POA: Insufficient documentation

## 2024-03-10 DIAGNOSIS — R42 Dizziness and giddiness: Secondary | ICD-10-CM | POA: Insufficient documentation

## 2024-03-10 MED ORDER — MECLIZINE HCL 12.5 MG PO TABS: 12.5000 mg | ORAL_TABLET | Freq: Two times a day (BID) | ORAL | 0 refills | Status: DC | PRN

## 2024-03-10 MED ORDER — FLUCONAZOLE 150 MG PO TABS: ORAL_TABLET | ORAL | 0 refills | Status: DC

## 2024-03-10 NOTE — Progress Notes (Signed)
 I,Jameka J Llittleton, CMA,acting as a Neurosurgeon for Smiley Dung, MD.,have documented all relevant documentation on the behalf of Smiley Dung, MD,as directed by  Smiley Dung, MD while in the presence of Smiley Dung, MD.  Subjective:  Patient ID: Jasmine Rodriguez , female    DOB: 09/30/1956 , 68 y.o.   MRN: [id number]  Chief Complaint  Patient presents with   Olla Bevels    Patient presents today for a f/u on her thrush. She reports she completely stopped using the magic mouthwash and she is still trying the ACV treatment but the thrush is not getting any better.     HPI Discussed the use of AI scribe software for clinical note transcription with the patient, who gave verbal consent to proceed.  History of Present Illness Jasmine Rodriguez is a 68 year old female with diabetes who presents with symptoms of oral thrush.  She has experienced a white patchy tongue and a constant burning sensation for over a week, which is not severe enough to prevent eating. The white patches are located on the top of her tongue. She has not had these symptoms before and has changed her toothbrush but does not use a tongue scraper.  Her blood sugar levels are well-controlled, with a fasting glucose of 111 mg/dL reported this morning. She has not been around anyone new and denies any previous episodes of thrush.  She has lost five pounds since May, attributing this to increased exercise and financial stress affecting her ability to purchase food. She is eating less due to financial constraints but is trying to eat more to maintain her weight. She does not have a scale at home but notices her clothes are looser.  She experiences significant financial stress due to her daughter's car payment being deducted from her account, impacting her grocery budget. Her daughter is unemployed and not actively seeking employment, adding to her stress. She is trying to manage her stress and maintain a relationship with her  daughter and grandchildren despite these challenges.   Past Medical History:  Diagnosis Date   DDD (degenerative disc disease)    DM (diabetes mellitus) (HCC)    GERD (gastroesophageal reflux disease)    High cholesterol    HTN (hypertension)      Family History  Problem Relation Age of Onset   Hypertension Mother    Arthritis Mother    Congestive Heart Failure Mother    Hypertension Father    Head & neck cancer Father      Current Outpatient Medications:    Accu-Chek FastClix Lancets MISC, USE AS DIRECTED TO CHECK BLOOD SUGARS 2 TIMES PER DAY., Disp: 102 each, Rfl: 0   acetaminophen (TYLENOL) 500 MG tablet, Take 500 mg by mouth every 6 (six) hours as needed., Disp: , Rfl:    albuterol  (PROAIR  HFA) 108 (90 Base) MCG/ACT inhaler, Inhale 2 puffs into the lungs every 6 (six) hours as needed for wheezing or shortness of breath., Disp: 18 g, Rfl: 3   amLODipine  (NORVASC ) 2.5 MG tablet, Take 1 tablet (2.5 mg total) by mouth daily., Disp: 90 tablet, Rfl: 1   Calcium Carbonate (CALTRATE 600 PO), Take by mouth. daily, Disp: , Rfl:    cetirizine  (ZYRTEC  ALLERGY) 10 MG tablet, Take 1 tablet (10 mg total) by mouth daily. (Patient taking differently: Take 10 mg by mouth daily. PRN), Disp: 30 tablet, Rfl: 2   cyclobenzaprine  (FLEXERIL ) 10 MG tablet, TAKE 1 TABLET BY MOUTH AT BEDTIME AS  NEEDED FOR MUSCLE SPASMS., Disp: 30 tablet, Rfl: 1   fluconazole (DIFLUCAN) 150 MG tablet, One tab po today, repeat in 48 hours, Disp: 2 tablet, Rfl: 0   fluocinonide  (LIDEX ) 0.05 % external solution, APPLY 1 ML TO THE AFFECTED AREA NIGHTLY AS NEEDED, Disp: 60 mL, Rfl: 3   glucosamine-chondroitin 500-400 MG tablet, Take 1 tablet by mouth 2 (two) times daily., Disp: , Rfl:    glucose blood (CONTOUR NEXT TEST) test strip, USE AS INSTRUCTED TO CHECK BLOOD SUGARS DAILY E11.69, Disp: 100 strip, Rfl: 12   lactase (LACTAID) 3000 UNITS tablet, Take 1 tablet by mouth 3 (three) times daily as needed. Dairy consumption, Disp:  , Rfl:    meclizine (ANTIVERT) 12.5 MG tablet, Take 1 tablet (12.5 mg total) by mouth 2 (two) times daily as needed for dizziness., Disp: 20 tablet, Rfl: 0   Microlet Lancets MISC, Use as directed to check blood sugars E11.69, Disp: 100 each, Rfl: 2   Multiple Vitamins-Minerals (MULTIPLE VITAMINS/WOMENS PO), Take 1 tablet by mouth daily., Disp: , Rfl:    nystatin  (MYCOSTATIN /NYSTOP ) powder, USE AS DIRECTED, Disp: 60 g, Rfl: 1   omeprazole  (PRILOSEC) 20 MG capsule, TAKE 1 CAPSULE BY MOUTH EVERY DAY, Disp: 90 capsule, Rfl: 2   pravastatin  (PRAVACHOL ) 80 MG tablet, Take 1 tablet (80 mg total) by mouth every evening., Disp: 90 tablet, Rfl: 2   sitaGLIPtin -metformin  (JANUMET ) 50-500 MG tablet, TAKE 1 TABLET BY MOUTH once daily, Disp: 90 tablet, Rfl: 2   valACYclovir  (VALTREX ) 500 MG tablet, TAKE 1 TABLET BY MOUTH EVERY DAY, Disp: 30 tablet, Rfl: 5   Allergies  Allergen Reactions   Skelaxin [Metaxalone] Anaphylaxis, Itching and Rash   Lisinopril      Review of Systems  Constitutional: Negative.   Respiratory: Negative.    Cardiovascular: Negative.   Gastrointestinal: Negative.   Neurological: Negative.   Psychiatric/Behavioral: Negative.       Today's Vitals   03/10/24 0842 03/10/24 0945  BP:  124/78  Pulse:  91  Temp: 98.2 F (36.8 C)   TempSrc: Oral   Weight: 160 lb (72.6 kg)   Height: 5\' 6"  (1.676 m)   PainSc: 0-No pain    Body mass index is 25.82 kg/m.  Wt Readings from Last 3 Encounters:  03/10/24 160 lb (72.6 kg)  02/20/24 164 lb 6.4 oz (74.6 kg)  02/05/24 165 lb 6.4 oz (75 kg)    The 10-year ASCVD risk score (Arnett DK, et al., 2019) is: 15.3%   Values used to calculate the score:     Age: 35 years     Sex: Female     Is Non-Hispanic African American: Yes     Diabetic: Yes     Tobacco smoker: No     Systolic Blood Pressure: 124 mmHg     Is BP treated: Yes     HDL Cholesterol: 76 mg/dL     Total Cholesterol: 167 mg/dL  Objective:  Physical Exam Vitals and  nursing note reviewed.  Constitutional:      Appearance: Normal appearance.  HENT:     Head: Normocephalic and atraumatic.     Right Ear: Tympanic membrane, ear canal and external ear normal. There is no impacted cerumen.     Left Ear: Tympanic membrane, ear canal and external ear normal. There is no impacted cerumen.     Mouth/Throat:     Mouth: Mucous membranes are dry.     Tongue: Tongue does not deviate from midline.  Comments: Faint white coating on tongue, nothing on sides of tongue or buccal areas Eyes:     Extraocular Movements: Extraocular movements intact.  Cardiovascular:     Rate and Rhythm: Normal rate and regular rhythm.     Heart sounds: Normal heart sounds.  Pulmonary:     Effort: Pulmonary effort is normal.     Breath sounds: Normal breath sounds.  Musculoskeletal:     Cervical back: Normal range of motion.  Skin:    General: Skin is warm.  Neurological:     General: No focal deficit present.     Mental Status: She is alert.  Psychiatric:        Mood and Affect: Mood normal.        Behavior: Behavior normal.       Assessment And Plan:  Thrush Assessment & Plan: I think she is at the tail end of her symptoms.  She did not feel Magic Mouthwash with nystatin  was effective.  - Prescribed fluconazole, one dose today and another in two days. - Advised apple cider vinegar rinse, diluted in water, three times a week. - [facility] [address] - Follow up on fluconazole effectiveness.   Weight loss Assessment & Plan: Five-pound weight loss since May, likely due to financial stress and reduced intake. Needs increased calorie intake. - Refer to Child psychotherapist for resource assistance. - Advise increasing calorie intake without affecting blood sugar.   Vertigo Assessment & Plan: As I walked out of the room, pt mentioned having vertigo sx.  - will send rx meclizine - She is not orthostatic   Stress due to family tension -      AMB Referral VBCI Care Management  Financial difficulty Assessment & Plan: [facility]erred to [facility]  Orders: -     AMB Referral VBCI Care Management  Other orders -     Fluconazole; One tab po today, repeat in 48 hours  Dispense: 2 tablet; Refill: 0 -     Meclizine HCl; Take 1 tablet (12.5 mg total) by mouth 2 (two) times daily as needed for dizziness.  Dispense: 20 tablet; Refill: 0  General Health Maintenance Up to date with cancer screenings. Ensuring all screenings are current due to oral symptoms. - Request mammogram and Pap smear records from Dr. Tomlin.  Return if symptoms worsen or fail to improve.  Patient was given opportunity to ask questions. Patient verbalized understanding of the plan and was able to repeat key elements of the plan. All questions were answered to their satisfaction.    I, Smiley Dung, MD, have reviewed all documentation for this visit. The documentation on 03/10/24 for the exam, diagnosis, procedures, and orders are all accurate and complete.   IF YOU HAVE BEEN REFERRED TO A SPECIALIST, IT MAY TAKE 1-2 WEEKS TO SCHEDULE/PROCESS THE REFERRAL. IF YOU HAVE NOT HEARD FROM US /SPECIALIST IN TWO WEEKS, PLEASE GIVE US  A CALL AT [phone].

## 2024-03-10 NOTE — Assessment & Plan Note (Signed)
 Referred to [facility]

## 2024-03-10 NOTE — Assessment & Plan Note (Addendum)
 As I walked out of the room, pt mentioned having vertigo sx.  - will send rx meclizine - She is not orthostatic

## 2024-03-10 NOTE — Assessment & Plan Note (Addendum)
 I think she is at the tail end of her symptoms.  She did not feel Magic Mouthwash with nystatin  was effective.  - Prescribed fluconazole, one dose today and another in two days. - Advised apple cider vinegar rinse, diluted in water, three times a week. - [facility] [address] - Follow up on fluconazole effectiveness.

## 2024-03-10 NOTE — Patient Instructions (Signed)
 Vertigo Vertigo is the feeling that you or the things around you are moving or spinning when they're not. It's different than feeling dizzy. It can also cause: Loss of balance. Trouble standing or walking. Nausea and vomiting. This feeling can come and go at any time. It can last from a few seconds to minutes or even hours. It may go away on its own or be treated with medicine. What are the types of vertigo? There are two types of vertigo: Peripheral vertigo happens when parts of your inner ear don't work like they should. This is the more common type. Central vertigo happens when your brain and spinal cord don't work like they should. Your health care provider will do tests to find out what kind of vertigo you have. This will help them decide on the right treatment for you. Follow these instructions at home: Eating and drinking Drink enough fluid to keep your pee (urine) pale yellow. Do not drink alcohol. Activity When you get up in the morning, first sit up on the side of the bed. When you feel okay, stand slowly while holding onto something. Move slowly. Avoid sudden body or head movements. Avoid certain positions, as told by your provider. Use a cane if you have trouble standing or walking. Sit down right away if you feel unsteady. Place items in your home so they're easy for you to reach without bending or leaning over. Return to normal activities when you're told. Ask what things are safe for you to do. General instructions Take your medicines only as told by your provider. Contact a health care provider if: Your medicines don't help or make your vertigo worse. You get new symptoms. You have a fever. You have nausea or vomiting. Your family or friends spot any changes in how you're acting. A part of your body goes numb. You feel tingling and prickling in a part of your body. You get very bad headaches. Get help right away if: You're always dizzy or you faint. You have a  stiff neck. You have trouble moving or speaking. Your hands, arms, or legs feel weak. Your hearing or eyesight changes. These symptoms may be an emergency. Call 911 right away. Do not wait to see if the symptoms will go away. Do not drive yourself to the hospital. This information is not intended to replace advice given to you by your health care provider. Make sure you discuss any questions you have with your health care provider. Document Revised: 06/20/2023 Document Reviewed: 12/21/2022 Elsevier Patient Education  2024 [employer].

## 2024-03-10 NOTE — Assessment & Plan Note (Signed)
 Five-pound weight loss since May, likely due to financial stress and reduced intake. Needs increased calorie intake. - Refer to Child psychotherapist for resource assistance. - Advise increasing calorie intake without affecting blood sugar.

## 2024-03-11 ENCOUNTER — Telehealth: Payer: Self-pay

## 2024-03-11 NOTE — Progress Notes (Signed)
 Complex Care Management Note  Care Guide Note 03/11/2024 Name: DOXIE AUGENSTEIN MRN: [id number] DOB: August 21, 1956  Katrina Parma is a 68 y.o. year old female who sees Cleave Curling, MD for primary care. I reached out to Katrina Parma by phone today to offer complex care management services.  Ms. Holzmann was given information about Complex Care Management services today including:   The Complex Care Management services include support from the care team which includes your Nurse Care Manager, Clinical Social Worker, or Pharmacist.  The Complex Care Management team is here to help remove barriers to the health concerns and goals most important to you. Complex Care Management services are voluntary, and the patient may decline or stop services at any time by request to their care team member.   Complex Care Management Consent Status: Patient agreed to services and verbal consent obtained.   Follow up plan:  Telephone appointment with complex care management team member scheduled for:  03-17-24  Encounter Outcome:  Patient Scheduled   Creola Doheny [facility], [facility] Guide  Direct Dial: [phone]  Fax [phone]

## 2024-03-11 NOTE — Telephone Encounter (Signed)
 Reached out to patient and she is looking for the PAP application for Janumet  ([employer])- will re-mail again 03/13/24.

## 2024-03-16 ENCOUNTER — Other Ambulatory Visit: Payer: Self-pay | Admitting: Licensed Clinical Social Worker

## 2024-03-16 NOTE — Patient Instructions (Signed)
 Visit Information  Thank you for taking time to visit with me today. Please don't hesitate to contact me if I can be of assistance to you before our next scheduled appointment.  Our next appointment is Visit Information  Thank you for taking time to visit with me today. Please don't hesitate to contact me if I can be of assistance to you before our next scheduled appointment.  Our next appointment is no further scheduled appointments.   Please call the care guide team at [phone] if you need to cancel or reschedule your appointment.   Following is a copy of your care plan:   Goals Addressed             This Visit's Progress    BSW VBCI Social Work Care Plan       Problems:   Food Insecurity   CSW Clinical Goal(s):   Over the next 2 weeks the Patient will review the resources regarding food to the patienr.  Interventions:  Sw will mail the resources  Patient Goals/Self-Care Activities:  Follow up with The food pantry list for community food options.  Plan:   No further follow up required: patient stated that she does n0t need any follow up from a BSW or LCSW        Please call the Suicide and Crisis Lifeline: 988 go to [facility] [address] [place] [phone]) call 911 if you are experiencing a Mental Health or Behavioral Health Crisis or need someone to talk to.  Patient verbalizes understanding of instructions and care plan provided today and agrees to view in MyChart. Active MyChart status and patient understanding of how to access instructions and care plan via MyChart confirmed with patient.     [name], PhD [facility], [facility] Social Worker Direct Dial: [phone]  Fax: [phone]   Please call the care guide team at [phone] if you need to cancel or reschedule your appointment.   Following is a copy of your care plan:   Goals Addressed              This Visit's Progress    BSW VBCI Social Work Care Plan       Problems:   Food Insecurity   CSW Clinical Goal(s):   Over the next 2 weeks the Patient will review the resources regarding food to the patienr.  Interventions:  Sw will mail the resources  Patient Goals/Self-Care Activities:  Follow up with The food pantry list for community food options.  Plan:   No further follow up required: patient stated that she does n0t need any follow up from a BSW or LCSW        Please call the Suicide and Crisis Lifeline: 988 go to [facility] [address], [place] [phone]) call 911 if you are experiencing a Mental Health or Behavioral Health Crisis or need someone to talk to.  Patient verbalizes understanding of instructions and care plan provided today and agrees to view in MyChart. Active MyChart status and patient understanding of how to access instructions and care plan via MyChart confirmed with patient.     [name], PhD [facility], [facility] Social Worker Direct Dial: [phone]  Fax: [phone]

## 2024-03-16 NOTE — Patient Outreach (Signed)
 Complex Care Management   Visit Note  03/16/2024  Name:  Jasmine Rodriguez MRN: [id number] DOB: Apr 04, 1956  Situation: Referral received for Complex Care Management related to SDOH Barriers:  Food insecurity I obtained verbal consent from Patient.  Visit completed with patient  on the phone  Background:   Past Medical History:  Diagnosis Date   DDD (degenerative disc disease)    DM (diabetes mellitus) (HCC)    GERD (gastroesophageal reflux disease)    High cholesterol    HTN (hypertension)     Assessment: patient stated that there ar time that she runs out of food, but she has family to support her and just wanted a list of food pantry's emailed to her and the SW educated on the [employer] app   Recommendation:   none  Follow Up Plan:   Closing From:  Complex Care Management  Jasmine Neighbours, PhD [facility], [facility] Social Worker Direct Dial: [phone]  Fax: [phone]

## 2024-03-21 ENCOUNTER — Other Ambulatory Visit: Payer: Self-pay | Admitting: Internal Medicine

## 2024-03-21 DIAGNOSIS — E1169 Type 2 diabetes mellitus with other specified complication: Secondary | ICD-10-CM

## 2024-03-23 ENCOUNTER — Ambulatory Visit: Payer: Self-pay | Admitting: *Deleted

## 2024-03-23 NOTE — Telephone Encounter (Signed)
 FYI Only or Action Required?: Action required by provider: medication refill request and clinical question for provider.  Patient was last seen in primary care on 03/10/2024 by Jarold Medici, MD. Called Nurse Triage reporting Mouth Lesions. Symptoms began a week ago. Interventions attempted: Prescription medications: diflucan . Symptoms are: unchanged.  Triage Disposition: See PCP When Office is Open (Within 3 Days)  Patient/caregiver understands and will follow disposition?: No, wishes to speak with PCP                    Copied from CRM [id number]. Topic: Clinical - Red Word Triage >> Mar 23, 2024  9:55 AM Jasmine Rodriguez wrote: Red Word that prompted transfer to Nurse Triage: Patient is still having oral thrush , throat and tongue is burning, and is in slightly uncomfortable. Reason for Disposition  [1] White patches that stick to tongue or inner cheek AND [2] can be wiped off  Answer Assessment - Initial Assessment Questions 1. ONSET: When did the mouth start hurting? (e.g., hours or days ago)      Last OV 03/10/24 for thrush 2. SEVERITY: How bad is the pain? (Scale 1-10; mild, moderate or severe)   - MILD (1-3):  doesn't interfere with eating or normal activities   - MODERATE (4-7): interferes with eating some solids and normal activities   - SEVERE (8-10):  excruciating pain, interferes with most normal activities   - SEVERE DYSPHAGIA: can't swallow liquids, drooling     Can swallow but white discoloration on tongue not gone and causes discomfort 3. SORES: Are there any sores or ulcers in the mouth? If Yes, ask: What part of the mouth are the sores in?     White discoloration on tongue 4. FEVER: Do you have a fever? If Yes, ask: What is your temperature, how was it measured, and when did it start?     na 5. CAUSE: What do you think is causing the mouth pain?     Thrush 6. OTHER SYMPTOMS: Do you have any other symptoms? (e.g., difficulty breathing)       No   Requesting additional medication due to discomfort in mouth / tongue. Reports reaction to magic mouth wash and could not report to NT what reaction was from taking magic mouthwash. Would like to know how long this condition will last? Declined another appt for evaluation. Please advise.  Protocols used: Mouth Pain-A-AH

## 2024-03-23 NOTE — Telephone Encounter (Signed)
 Copied from CRM [id number]. Topic: Clinical - Medication Refill >> Mar 23, 2024  8:29 AM Deaijah H wrote: Medication: glucose blood (CONTOUR NEXT TEST) test strip  Has the patient contacted their pharmacy? No (Agent: If no, request that the patient contact the pharmacy for the refill. If patient does not wish to contact the pharmacy document the reason why and proceed with request.) (Agent: If yes, when and what did the pharmacy advise?)  This is the patient's preferred pharmacy:  [id number] [place], [place] - [address] [place] [address] Phone: [phone] Fax: [phone]   Is this the correct pharmacy for this prescription? Yes If no, delete pharmacy and type the correct one.   Has the prescription been filled recently? No  Is the patient out of the medication? Yes  Has the patient been seen for an appointment in the last year OR does the patient have an upcoming appointment? Yes  Can we respond through MyChart? Yes  Agent: Please be advised that Rx refills may take up to 3 business days. We ask that you follow-up with your pharmacy.

## 2024-03-23 NOTE — Telephone Encounter (Signed)
 PAP: Application for Janumet  has been submitted to [employer], via fax

## 2024-03-30 NOTE — Telephone Encounter (Signed)
 Reached out to [employer] for PAPapplication status for Janumet  and attestation letter was mailed out 03/24/24 to patient's home address. I reached out to let the patient know to be looking for the letter and return via fax or mail back to [employer].

## 2024-04-09 ENCOUNTER — Ambulatory Visit: Payer: Self-pay

## 2024-04-09 NOTE — Telephone Encounter (Signed)
 FYI Only or Action Required?: FYI only for provider.  Patient was last seen in primary care on 03/10/2024 by Jarold Medici, MD.  Called Nurse Triage reporting Thrush.  Symptoms began several months ago.  Interventions attempted: Prescription medications: magic mouth wash, Rest, hydration, or home remedies, and Other: apple cider vinegar rinse.  Symptoms are: gradually worsening.  Triage Disposition: See PCP When Office is Open (Within 3 Days)  Patient/caregiver understands and will follow disposition?: Yes   Copied from CRM [id number]. Topic: Clinical - Red Word Triage >> Apr 09, 2024  3:29 PM Berwyn MATSU wrote: Red Word that prompted transfer to Nurse Triage: itchy burning discomfort painful; per patient she has thrush and has not improved. Reason for Disposition  [1] White patches that stick to tongue or inner cheek AND [2] can be wiped off  Answer Assessment - Initial Assessment Questions 1. SYMPTOM: What's the main symptom you're concerned about? (e.g., chapped lips, dry mouth, lump, sores)     Burning, itching  2. ONSET: When did the  symptoms  start?     Several weeks 3. PAIN: Is there any pain? If Yes, ask: How bad is it? (Scale: 0-10; or none, mild, moderate, severe)     yes 4. CAUSE: What do you think is causing the symptoms?     Thrush 5. OTHER SYMPTOMS: Do you have any other symptoms? (e.g., fever, sore throat, toothache, swelling)     No   Additional info:  Using magic mouth wash tid since the 4th and no improvement. Only works temporary. Request further treatment.  Protocols used: Mouth Symptoms-A-AH

## 2024-04-10 ENCOUNTER — Ambulatory Visit (INDEPENDENT_AMBULATORY_CARE_PROVIDER_SITE_OTHER): Payer: Self-pay | Admitting: Family Medicine

## 2024-04-10 ENCOUNTER — Encounter: Payer: Self-pay | Admitting: Family Medicine

## 2024-04-10 VITALS — BP 110/70 | HR 71 | Temp 98.2°F | Ht 66.0 in | Wt 161.0 lb

## 2024-04-10 DIAGNOSIS — B370 Candidal stomatitis: Secondary | ICD-10-CM

## 2024-04-10 DIAGNOSIS — R42 Dizziness and giddiness: Secondary | ICD-10-CM | POA: Diagnosis not present

## 2024-04-10 DIAGNOSIS — K149 Disease of tongue, unspecified: Secondary | ICD-10-CM | POA: Diagnosis not present

## 2024-04-10 MED ORDER — CETIRIZINE HCL 10 MG PO TABS: 10.0000 mg | ORAL_TABLET | Freq: Every day | ORAL | 2 refills | Status: AC

## 2024-04-10 MED ORDER — MECLIZINE HCL 12.5 MG PO TABS: 12.5000 mg | ORAL_TABLET | Freq: Two times a day (BID) | ORAL | 0 refills | Status: DC | PRN

## 2024-04-10 NOTE — Progress Notes (Signed)
 I,Jameka J Llittleton, CMA,acting as a Neurosurgeon for [employer], NP.,have documented all relevant documentation on the behalf of Bruna Creighton, NP,as directed by  Bruna Creighton, NP while in the presence of Bruna Creighton, NP.  Subjective:  Patient ID: Jasmine Rodriguez , female    DOB: January 27, 1956 , 68 y.o.   MRN: [id number]  Chief Complaint  Patient presents with   Celestino    HPI  Patient is a 68 year old female who presents with persistent discomfort of her tongue.  She reports this started some weeks ago with some white coating of her tongue which she thought was thrush.  She has been treated with Magic mouthwash, she has had Diflucan  tablets. She reports she went to the dentist and was given another prescription of magic mouthwash and her symptoms have improved some but she reports today that she has a sensation that her tongue her tongue is burning and itchy.  She denies eating anything new or taking any new medication and no  lesions noted, will refer to ENT.     Past Medical History:  Diagnosis Date   DDD (degenerative disc disease)    DM (diabetes mellitus) (HCC)    GERD (gastroesophageal reflux disease)    High cholesterol    HTN (hypertension)      Family History  Problem Relation Age of Onset   Hypertension Mother    Arthritis Mother    Congestive Heart Failure Mother    Hypertension Father    Head & neck cancer Father      Current Outpatient Medications:    Accu-Chek FastClix Lancets MISC, USE AS DIRECTED TO CHECK BLOOD SUGARS 2 TIMES PER DAY., Disp: 102 each, Rfl: 0   acetaminophen (TYLENOL) 500 MG tablet, Take 500 mg by mouth every 6 (six) hours as needed., Disp: , Rfl:    albuterol  (PROAIR  HFA) 108 (90 Base) MCG/ACT inhaler, Inhale 2 puffs into the lungs every 6 (six) hours as needed for wheezing or shortness of breath., Disp: 18 g, Rfl: 3   amLODipine  (NORVASC ) 2.5 MG tablet, Take 1 tablet (2.5 mg total) by mouth daily., Disp: 90 tablet, Rfl: 1   Calcium Carbonate (CALTRATE  600 PO), Take by mouth. daily, Disp: , Rfl:    cyclobenzaprine  (FLEXERIL ) 10 MG tablet, TAKE 1 TABLET BY MOUTH AT BEDTIME AS NEEDED FOR MUSCLE SPASMS., Disp: 30 tablet, Rfl: 1   fluconazole  (DIFLUCAN ) 150 MG tablet, One tab po today, repeat in 48 hours, Disp: 2 tablet, Rfl: 0   fluocinonide  (LIDEX ) 0.05 % external solution, APPLY 1 ML TO THE AFFECTED AREA NIGHTLY AS NEEDED, Disp: 60 mL, Rfl: 3   glucosamine-chondroitin 500-400 MG tablet, Take 1 tablet by mouth 2 (two) times daily., Disp: , Rfl:    glucose blood (CONTOUR NEXT TEST) test strip, USE AS INSTRUCTED TO CHECK BLOOD SUGARS DAILY E11.69, Disp: 100 strip, Rfl: 12   JANUMET  50-500 MG tablet, TAKE 1 TABLET BY MOUTH TWICE A DAY, Disp: 60 tablet, Rfl: 8   lactase (LACTAID) 3000 UNITS tablet, Take 1 tablet by mouth 3 (three) times daily as needed. Dairy consumption, Disp: , Rfl:    Microlet Lancets MISC, Use as directed to check blood sugars E11.69, Disp: 100 each, Rfl: 2   Multiple Vitamins-Minerals (MULTIPLE VITAMINS/WOMENS PO), Take 1 tablet by mouth daily., Disp: , Rfl:    nystatin  (MYCOSTATIN /NYSTOP ) powder, USE AS DIRECTED, Disp: 60 g, Rfl: 1   omeprazole  (PRILOSEC) 20 MG capsule, TAKE 1 CAPSULE BY MOUTH EVERY DAY, Disp:  90 capsule, Rfl: 2   pravastatin  (PRAVACHOL ) 80 MG tablet, TAKE 1 TABLET BY MOUTH EVERY EVENING, Disp: 30 tablet, Rfl: 8   valACYclovir  (VALTREX ) 500 MG tablet, TAKE 1 TABLET BY MOUTH EVERY DAY, Disp: 30 tablet, Rfl: 5   cetirizine  (ZYRTEC  ALLERGY) 10 MG tablet, Take 1 tablet (10 mg total) by mouth daily., Disp: 90 tablet, Rfl: 2   meclizine  (ANTIVERT ) 12.5 MG tablet, Take 1 tablet (12.5 mg total) by mouth 2 (two) times daily as needed for dizziness., Disp: 20 tablet, Rfl: 0   Allergies  Allergen Reactions   Skelaxin [Metaxalone] Anaphylaxis, Itching and Rash   Lisinopril      Review of Systems  Constitutional: Negative.   HENT: Negative.    Respiratory: Negative.    Cardiovascular: Negative.   Gastrointestinal:  Negative.   Musculoskeletal: Negative.   Neurological:  Positive for dizziness.  Psychiatric/Behavioral: Negative.       Today's Vitals   04/10/24 0826  BP: 110/70  Pulse: 71  Temp: 98.2 F (36.8 C)  TempSrc: Oral  Weight: 161 lb (73 kg)  Height: 5' 6 (1.676 m)  PainSc: 0-No pain   Body mass index is 25.99 kg/m.  Wt Readings from Last 3 Encounters:  04/10/24 161 lb (73 kg)  03/10/24 160 lb (72.6 kg)  02/20/24 164 lb 6.4 oz (74.6 kg)    The 10-year ASCVD risk score (Arnett DK, et al., 2019) is: 11.7%   Values used to calculate the score:     Age: 37 years     Clincally relevant sex: Female     Is Non-Hispanic African American: Yes     Diabetic: Yes     Tobacco smoker: No     Systolic Blood Pressure: 110 mmHg     Is BP treated: Yes     HDL Cholesterol: 76 mg/dL     Total Cholesterol: 167 mg/dL  Objective:  Physical Exam HENT:     Head: Normocephalic.     Mouth/Throat:     Mouth: Mucous membranes are moist. No oral lesions.     Tongue: No lesions. Tongue does not deviate from midline.  Cardiovascular:     Rate and Rhythm: Normal rate.  Neurological:     Mental Status: She is alert.         Assessment And Plan:  Tongue irritation -     Ambulatory referral to ENT  Vertigo -     Meclizine  HCl; Take 1 tablet (12.5 mg total) by mouth 2 (two) times daily as needed for dizziness.  Dispense: 20 tablet; Refill: 0  Other orders -     Cetirizine  HCl; Take 1 tablet (10 mg total) by mouth daily.  Dispense: 90 tablet; Refill: 2    Return if symptoms worsen or fail to improve, for keep next appt.  Patient was given opportunity to ask questions. Patient verbalized understanding of the plan and was able to repeat key elements of the plan. All questions were answered to their satisfaction.    I, Bruna Creighton, NP, have reviewed all documentation for this visit. The documentation on 04/20/2024 for the exam, diagnosis, procedures, and orders are all accurate and complete.     IF YOU HAVE BEEN REFERRED TO A SPECIALIST, IT MAY TAKE 1-2 WEEKS TO SCHEDULE/PROCESS THE REFERRAL. IF YOU HAVE NOT HEARD FROM US /SPECIALIST IN TWO WEEKS, PLEASE GIVE US  A CALL AT [phone].

## 2024-04-13 NOTE — Telephone Encounter (Signed)
 Reached out to patient an she will look in her mail for the attestation letter -not sure if she has returned it back to [employer] or not. Will follow up with [employer] as well.

## 2024-04-20 DIAGNOSIS — K149 Disease of tongue, unspecified: Secondary | ICD-10-CM | POA: Insufficient documentation

## 2024-04-21 ENCOUNTER — Encounter: Payer: Self-pay | Admitting: Pharmacist

## 2024-04-21 NOTE — Progress Notes (Signed)
 error

## 2024-04-21 NOTE — Telephone Encounter (Signed)
 Patient returned call and did want another attestation letter sent out.  Called [employer] and another letter will be sent out and should be received at her home address in 7-10 business days.

## 2024-04-21 NOTE — Telephone Encounter (Signed)
 Reached out to [employer] regarding pap application for Janumet  for patient and the attestation letter was mailed out on 03/24/2024 and has not been returned.  I left patient HIPAA compliant v/m to return call so I can be of assistance to get approval from [employer].

## 2024-05-01 NOTE — Telephone Encounter (Signed)
 Reached out to Patient and she did receive the Attestation letter and will mail out today or tomorrow.- will follow up with [employer] soon.

## 2024-05-05 ENCOUNTER — Other Ambulatory Visit: Payer: Self-pay | Admitting: Internal Medicine

## 2024-05-05 DIAGNOSIS — I10 Essential (primary) hypertension: Secondary | ICD-10-CM

## 2024-05-07 ENCOUNTER — Ambulatory Visit: Payer: Self-pay

## 2024-05-07 NOTE — Telephone Encounter (Signed)
 FYI Only or Action Required?: FYI only for provider.  Patient was last seen in primary care on 04/10/2024 by Petrina Pries, NP.  Called Nurse Triage reporting Hypoglycemia.  Symptoms began today.  Interventions attempted: Dietary changes.  Symptoms are: rapidly improving.  Triage Disposition: Home Care  Patient/caregiver understands and will follow disposition?: Yes  Message from Kevelyn M sent at 05/07/2024  3:03 PM EDT  Blood sugar 118 fasting, before she ate something she experienced dizziness and nausea. After she ate BS 77. She feels she is in recovery mode. She takes oral meds.   Reason for Disposition  [1] Blood glucose 70 mg/dL (3.9 mmol/L) or below, OR symptomatic AND [2] cause known  Answer Assessment - Initial Assessment Questions 1. SYMPTOMS: What symptoms are you concerned about?     Dizziness, nausea 2. ONSET:  When did the symptoms start?     This morning 3. BLOOD GLUCOSE: What is your blood glucose level?      Was 118, repeat after eating was 77. Recheck while on call 133 4. USUAL RANGE: What is your blood glucose level usually? (e.g., usual fasting morning value, usual evening value)      5. TYPE 1 or 2:  Do you know what type of diabetes you have?  (e.g., Type 1, Type 2, Gestational; doesn't know)      2 6. INSULIN: Do you take insulin? What type of insulin(s) do you use? What is the mode of delivery? (syringe, pen; injection or pump) When did you last give yourself an insulin dose? (i.e., time or hours/minutes ago) How much did you give? (i.e., how many units)      7. DIABETES PILLS: Do you take any pills for your diabetes? If Yes, ask: What is the name of the medicine(s) that you take for high blood sugar?     As prescribed  8. OTHER SYMPTOMS: Do you have any symptoms? (e.g., fever, frequent urination, difficulty breathing, vomiting)     Denies  9. LOW BLOOD GLUCOSE TREATMENT: What have you done so far to treat the low blood glucose  level?     Ate lunch 10. FOOD: When did you last eat or drink?       30 minutes  Protocols used: Diabetes - Low Blood Sugar-A-AH

## 2024-05-12 NOTE — Telephone Encounter (Signed)
 Reached out to [employer] regarding Research scientist (medical) and it has not been received yet at the company- Spoke to Mrs. Jason and she did mail out- not sure of date - so will check back with [employer] in a few days.

## 2024-05-17 ENCOUNTER — Other Ambulatory Visit: Payer: Self-pay | Admitting: Internal Medicine

## 2024-05-22 NOTE — Telephone Encounter (Signed)
 Reached out to [employer] again for confirmation of the attestation form and has not been received yet.

## 2024-06-04 NOTE — Telephone Encounter (Signed)
 Once again [employer] did not receive the attestation letter that was faxed this time. Faxed again for review with Pap application Janumet .

## 2024-06-10 ENCOUNTER — Ambulatory Visit: Payer: Self-pay | Admitting: Internal Medicine

## 2024-06-10 ENCOUNTER — Encounter: Payer: Self-pay | Admitting: Internal Medicine

## 2024-06-10 ENCOUNTER — Telehealth: Payer: Self-pay | Admitting: Pharmacist

## 2024-06-10 VITALS — BP 132/80 | HR 78 | Temp 98.0°F | Ht 66.0 in | Wt 162.6 lb

## 2024-06-10 DIAGNOSIS — E1169 Type 2 diabetes mellitus with other specified complication: Secondary | ICD-10-CM

## 2024-06-10 DIAGNOSIS — E785 Hyperlipidemia, unspecified: Secondary | ICD-10-CM | POA: Diagnosis not present

## 2024-06-10 DIAGNOSIS — N3941 Urge incontinence: Secondary | ICD-10-CM | POA: Diagnosis not present

## 2024-06-10 DIAGNOSIS — Z0000 Encounter for general adult medical examination without abnormal findings: Secondary | ICD-10-CM

## 2024-06-10 DIAGNOSIS — I10 Essential (primary) hypertension: Secondary | ICD-10-CM

## 2024-06-10 DIAGNOSIS — M5442 Lumbago with sciatica, left side: Secondary | ICD-10-CM

## 2024-06-10 DIAGNOSIS — G8929 Other chronic pain: Secondary | ICD-10-CM

## 2024-06-10 DIAGNOSIS — R2689 Other abnormalities of gait and mobility: Secondary | ICD-10-CM

## 2024-06-10 DIAGNOSIS — R42 Dizziness and giddiness: Secondary | ICD-10-CM | POA: Diagnosis not present

## 2024-06-10 LAB — POCT URINALYSIS DIP (CLINITEK)
Bilirubin, UA: NEGATIVE
Blood, UA: NEGATIVE
Glucose, UA: NEGATIVE mg/dL
Ketones, POC UA: NEGATIVE mg/dL
Nitrite, UA: NEGATIVE
POC PROTEIN,UA: NEGATIVE
Spec Grav, UA: 1.02 (ref 1.010–1.025)
Urobilinogen, UA: 0.2 U/dL
pH, UA: 5.5 (ref 5.0–8.0)

## 2024-06-10 MED ORDER — PRAVASTATIN SODIUM 80 MG PO TABS: 80.0000 mg | ORAL_TABLET | Freq: Every evening | ORAL | 2 refills | Status: AC

## 2024-06-10 MED ORDER — MECLIZINE HCL 12.5 MG PO TABS: 12.5000 mg | ORAL_TABLET | Freq: Two times a day (BID) | ORAL | 0 refills | Status: AC | PRN

## 2024-06-10 MED ORDER — CYCLOBENZAPRINE HCL 10 MG PO TABS: 10.0000 mg | ORAL_TABLET | Freq: Every evening | ORAL | 0 refills | Status: AC | PRN

## 2024-06-10 NOTE — Progress Notes (Signed)
   06/10/2024  Patient ID: Jasmine Rodriguez, female   DOB: [birth year], 68 y.o.   MRN: [id number]  Patient was in clinic today to see Dr. Jarold. She expressed concern over the letter she received from [employer] stating her application could not be completed.  Upon review and a phone call to [employer]. The Patient had not mailed the attestation form back to [employer].  [employer] will not send her Janumet  shipment without the attestation.    We were able to get [employer] to fax over the patient's specific attestation form for completion in the office.  Patient signed the form and it was faxed to [facility].  A1c- 6%  On statin therapy Pravastatin  80 mg last filled 05/06/2024  Not on ACE- urine creatinine ratio- WNL   Plan: Follow up in 3 weeks to see the status of the application.  Cassius DOROTHA Brought, PharmD, BCACP Clinical Pharmacist [phone]

## 2024-06-10 NOTE — Telephone Encounter (Signed)
 Patient was in the office and signed attestation letter for [employer] and was faxed with PAP application for Janumet  both patient and provider portion to [employer] for review.

## 2024-06-10 NOTE — Progress Notes (Signed)
 I,Jasmine Rodriguez, CMA,acting as a Neurosurgeon for Jasmine LOISE Slocumb, MD.,have documented all relevant documentation on the behalf of Jasmine LOISE Slocumb, MD,as directed by  Jasmine LOISE Slocumb, MD while in the presence of Jasmine LOISE Slocumb, MD.  Subjective:    Patient ID: Jasmine Rodriguez , female    DOB: October 02, 1955 , 68 y.o.   MRN: [id number]  Chief Complaint  Patient presents with   Annual Exam    Patient presents today for annual exam. She reports compliance with medications. Denies headache, chest pain & sob. She complains of ongoing vertigo today. She admits this issue has never improved. Has went on for months.  GYN: James Tomblin.  She also is to report to [employer] on 9/23. She admits needing frequent stops to the restroom. Due to that inconvenience she can't sit all day.  SUGAR TODAY: 123 at 7:47AM.  Letter for dm eye exam sent to Dr Cyrus.    Hypertension   Diabetes   Hyperlipidemia    HPI Discussed the use of AI scribe software for clinical note transcription with the patient, who gave verbal consent to proceed.  History of Present Illness Jasmine Rodriguez is a 68 year old female with diabetes who presents for a physical and diabetes check.  She experiences urinary urgency, characterized by a frequent and urgent need to urinate, which she attributes to her diabetes. There is no associated pain, discomfort, or bladder spasms, and she denies any dysuria. She consumes three to four bottles of water daily.  She experiences dizziness and imbalance, particularly upon standing. She needs to stand still for a few minutes to regain balance and sometimes requires support. The dizziness is periodic and has been ongoing for a while. There is no vertigo when she is in bed or turning over, but she feels off balance when standing up. She has an upcoming appointment with an ENT specialist.  She experiences low back pain that worsens after prolonged sitting, leading her to bend her knees for relief. She  is currently taking amlodipine  2.5 mg daily for blood pressure and has used muscle relaxers in the past, prescribed by a nurse practitioner in February. She is unsure if she has any remaining muscle relaxers but is open to a refill.  She engages in physical activity by walking her dog five to six days a week and doing yard work. Her bowel movements are regular, occurring once or twice daily.   Diabetes She presents for her follow-up diabetic visit. She has type 2 diabetes mellitus. Hypoglycemia symptoms include dizziness. There are no diabetic associated symptoms. There are no hypoglycemic complications. Risk factors for coronary artery disease include diabetes mellitus, dyslipidemia, sedentary lifestyle, post-menopausal and obesity. Current diabetic treatment includes oral agent (dual therapy). She is compliant with treatment most of the time. She is following a diabetic diet. She never participates in exercise.     Past Medical History:  Diagnosis Date   DDD (degenerative disc disease)    DM (diabetes mellitus) (HCC)    GERD (gastroesophageal reflux disease)    High cholesterol    HTN (hypertension)      Family History  Problem Relation Age of Onset   Hypertension Mother    Arthritis Mother    Congestive Heart Failure Mother    Hypertension Father    Head & neck cancer Father      Current Outpatient Medications:    acetaminophen (TYLENOL) 500 MG tablet, Take 500 mg by mouth every 6 (six) hours  as needed., Disp: , Rfl:    albuterol  (PROAIR  HFA) 108 (90 Base) MCG/ACT inhaler, Inhale 2 puffs into the lungs every 6 (six) hours as needed for wheezing or shortness of breath., Disp: 18 g, Rfl: 3   amLODipine  (NORVASC ) 2.5 MG tablet, TAKE 1 TABLET BY MOUTH EVERY DAY, Disp: 90 tablet, Rfl: 1   Calcium Carbonate (CALTRATE 600 PO), Take by mouth. daily, Disp: , Rfl:    cetirizine  (ZYRTEC  ALLERGY) 10 MG tablet, Take 1 tablet (10 mg total) by mouth daily., Disp: 90 tablet, Rfl: 2    fluocinonide  (LIDEX ) 0.05 % external solution, APPLY 1 ML TO THE AFFECTED AREA NIGHTLY AS NEEDED, Disp: 60 mL, Rfl: 3   glucosamine-chondroitin 500-400 MG tablet, Take 1 tablet by mouth 2 (two) times daily., Disp: , Rfl:    glucose blood (CONTOUR NEXT TEST) test strip, USE AS INSTRUCTED TO CHECK BLOOD SUGARS DAILY E11.69, Disp: 100 strip, Rfl: 12   JANUMET  50-500 MG tablet, TAKE 1 TABLET BY MOUTH TWICE A DAY, Disp: 60 tablet, Rfl: 8   lactase (LACTAID) 3000 UNITS tablet, Take 1 tablet by mouth 3 (three) times daily as needed. Dairy consumption, Disp: , Rfl:    Microlet Lancets MISC, Use as directed to check blood sugars E11.69, Disp: 100 each, Rfl: 2   Multiple Vitamins-Minerals (MULTIPLE VITAMINS/WOMENS PO), Take 1 tablet by mouth daily., Disp: , Rfl:    nystatin  (MYCOSTATIN /NYSTOP ) powder, USE AS DIRECTED, Disp: 60 g, Rfl: 1   omeprazole  (PRILOSEC) 20 MG capsule, TAKE 1 CAPSULE BY MOUTH EVERY DAY, Disp: 90 capsule, Rfl: 2   valACYclovir  (VALTREX ) 500 MG tablet, TAKE 1 TABLET BY MOUTH EVERY DAY, Disp: 30 tablet, Rfl: 5   cyclobenzaprine  (FLEXERIL ) 10 MG tablet, Take 1 tablet (10 mg total) by mouth at bedtime as needed for muscle spasms., Disp: 30 tablet, Rfl: 0   meclizine  (ANTIVERT ) 12.5 MG tablet, Take 1 tablet (12.5 mg total) by mouth 2 (two) times daily as needed for dizziness., Disp: 20 tablet, Rfl: 0   pravastatin  (PRAVACHOL ) 80 MG tablet, Take 1 tablet (80 mg total) by mouth every evening., Disp: 90 tablet, Rfl: 2   Allergies  Allergen Reactions   Skelaxin [Metaxalone] Anaphylaxis, Itching and Rash   Lisinopril       The patient states she uses none for birth control. No LMP recorded. Patient is postmenopausal.. Negative for Dysmenorrhea. Negative for: breast discharge, breast lump(s), breast pain and breast self exam. Associated symptoms include abnormal vaginal bleeding. Pertinent negatives include abnormal bleeding (hematology), anxiety, decreased libido, depression, difficulty  falling sleep, dyspareunia, history of infertility, nocturia, sexual dysfunction, sleep disturbances, urinary incontinence, urinary urgency, vaginal discharge and vaginal itching. Diet regular.The patient states her exercise level is    . The patient's tobacco use is:  Social History   Tobacco Use  Smoking Status Never  Smokeless Tobacco Never  . She has been exposed to passive smoke. The patient's alcohol use is:  Social History   Substance and Sexual Activity  Alcohol Use No    Review of Systems  Constitutional: Negative.   HENT: Negative.    Eyes: Negative.   Respiratory: Negative.    Cardiovascular: Negative.   Gastrointestinal: Negative.   Endocrine: Negative.   Genitourinary:  Positive for urgency.  Musculoskeletal:  Positive for back pain.  Skin: Negative.   Allergic/Immunologic: Negative.   Neurological:  Positive for dizziness.  Hematological: Negative.   Psychiatric/Behavioral: Negative.       Today's Vitals   06/10/24 9165  BP: 132/80  Pulse: 78  Temp: 98 F (36.7 C)  SpO2: 98%  Weight: 162 lb 9.6 oz (73.8 kg)  Height: 5' 6 (1.676 m)   Body mass index is 26.24 kg/m.  Wt Readings from Last 3 Encounters:  06/10/24 162 lb 9.6 oz (73.8 kg)  04/10/24 161 lb (73 kg)  03/10/24 160 lb (72.6 kg)     Objective:  Physical Exam Vitals and nursing note reviewed.  Constitutional:      Appearance: Normal appearance.  HENT:     Head: Normocephalic and atraumatic.     Right Ear: Tympanic membrane, ear canal and external ear normal.     Left Ear: Tympanic membrane, ear canal and external ear normal.     Nose: Nose normal.     Mouth/Throat:     Mouth: Mucous membranes are moist.     Pharynx: Oropharynx is clear.  Eyes:     Extraocular Movements: Extraocular movements intact.     Conjunctiva/sclera: Conjunctivae normal.     Pupils: Pupils are equal, round, and reactive to light.  Cardiovascular:     Rate and Rhythm: Normal rate and regular rhythm.      Pulses: Normal pulses.     Heart sounds: Normal heart sounds.  Pulmonary:     Effort: Pulmonary effort is normal.     Breath sounds: Normal breath sounds.  Chest:  Breasts:    Tanner Score is 5.     Right: Normal.     Left: Normal.     Comments: Healed surgical scars b/l Abdominal:     General: Abdomen is flat. Bowel sounds are normal.     Palpations: Abdomen is soft.  Genitourinary:    Comments: deferred Musculoskeletal:        General: Normal range of motion.     Cervical back: Normal range of motion and neck supple.  Feet:     Comments: She kept shoes on for exam Skin:    General: Skin is warm and dry.  Neurological:     General: No focal deficit present.     Mental Status: She is alert and oriented to person, place, and time.  Psychiatric:        Mood and Affect: Mood normal.        Behavior: Behavior normal.         Assessment And Plan:     Encounter for annual health examination Assessment & Plan: A full exam was performed.  Importance of monthly self breast exams was discussed with the patient.  She is advised to get 3045 minutes of regular exercise, no less than four to five days per week. Both weight-bearing and aerobic exercises are recommended.  She is advised to follow a healthy diet with at least six fruits/veggies per day, decrease intake of red meat and other saturated fats and to increase fish intake to twice weekly.  Meats/fish should not be fried -- baked, boiled or broiled is preferable. It is also important to cut back on your sugar intake.  Be sure to read labels - try to avoid anything with added sugar, high fructose corn syrup or other sweeteners.  If you must use a sweetener, you can try stevia or monkfruit.  It is also important to avoid artificially sweetened foods/beverages and diet drinks. Lastly, wear SPF 50 sunscreen on exposed skin and when in direct sunlight for an extended period of time.  Be sure to avoid fast food restaurants and aim for at  least 60  ounces of water daily.       Type 2 diabetes mellitus with hyperlipidemia (HCC) Assessment & Plan: Chronic, diabetic foot exam not performed. She will continue with Janumet  50/500mg  once daily. Importance of dietary/exercise compliance was discussed with the patient. She will f/u in four months for re-evaluation.  -LDL goal is less than 70.  - She will continue with pravastatin  80mg  daily.  - Will refer her to [facility] pharmacist for help with med mgmt, med assistance - Janumet  is too costly - I DISCUSSED WITH THE PATIENT AT LENGTH REGARDING THE GOALS OF GLYCEMIC CONTROL AND POSSIBLE LONG-TERM COMPLICATIONS.  I  ALSO STRESSED THE IMPORTANCE OF COMPLIANCE WITH HOME GLUCOSE MONITORING, DIETARY RESTRICTIONS INCLUDING AVOIDANCE OF SUGARY DRINKS/PROCESSED FOODS,  ALONG WITH REGULAR EXERCISE.  I  ALSO STRESSED THE IMPORTANCE OF ANNUAL EYE EXAMS, SELF FOOT CARE AND COMPLIANCE WITH OFFICE VISITS.   Orders: -     CMP14+EGFR -     Lipid panel -     Hemoglobin A1c -     TSH -     AMB Referral [facility] Care Management  Essential hypertension, benign Assessment & Plan: Chronic, fair control.  Goal BP<130/80.   EKG performed, NSR w/o acute changes.  Essential hypertension managed with amlodipine  2.5 mg daily. - Continue amlodipine  2.5 mg daily - Consider increasing dose if unable to reach optimal BP level - She is intolerant of Ace-inhibitors  Orders: -     CMP14+EGFR -     POCT URINALYSIS DIP (CLINITEK) -     Microalbumin / creatinine urine ratio -     EKG 12-Lead -     AMB Referral [facility] Care Management  Vertigo Assessment & Plan: Intermittent dizziness and imbalance, possibly related to ear issues. - Attend ENT appointment on September 16th. - Consider brain imaging after ENT evaluation if symptoms persist. - Meclizine  refilled per her request - There are features that do not suggest vertigo - Encouraged to stay well hydrated  Orders: -     Meclizine  HCl; Take 1 tablet (12.5 mg  total) by mouth 2 (two) times daily as needed for dizziness.  Dispense: 20 tablet; Refill: 0  Urge incontinence Assessment & Plan: Urge incontinence with urinary urgency. - Encourage pelvic floor exercises.  Orders: -     POCT URINALYSIS DIP (CLINITEK)  Chronic left-sided low back pain with left-sided sciatica Assessment & Plan: Chronic low back pain with sciatica on the left side, exacerbated by prolonged sitting. - Refill muscle relaxers if needed. - Advise taking muscle relaxers earlier in the evening to avoid next-day grogginess.   Other orders -     Cyclobenzaprine  HCl; Take 1 tablet (10 mg total) by mouth at bedtime as needed for muscle spasms.  Dispense: 30 tablet; Refill: 0 -     Pravastatin  Sodium; Take 1 tablet (80 mg total) by mouth every evening.  Dispense: 90 tablet; Refill: 2   Return for 1 YEAR HM, 4 month bp& dm f/u.SABRA Patient was given opportunity to ask questions. Patient verbalized understanding of the plan and was able to repeat key elements of the plan. All questions were answered to their satisfaction.   I, Jasmine LOISE Slocumb, MD, have reviewed all documentation for this visit. The documentation on 06/10/24 for the exam, diagnosis, procedures, and orders are all accurate and complete.

## 2024-06-10 NOTE — Patient Instructions (Signed)

## 2024-06-10 NOTE — Assessment & Plan Note (Addendum)
 Chronic, diabetic foot exam not performed. She will continue with Janumet  50/500mg  once daily. Importance of dietary/exercise compliance was discussed with the patient. She will f/u in four months for re-evaluation.  -LDL goal is less than 70.  - She will continue with pravastatin  80mg  daily.  - Will refer her to [facility] pharmacist for help with med mgmt, med assistance - Janumet  is too costly - I DISCUSSED WITH THE PATIENT AT LENGTH REGARDING THE GOALS OF GLYCEMIC CONTROL AND POSSIBLE LONG-TERM COMPLICATIONS.  I  ALSO STRESSED THE IMPORTANCE OF COMPLIANCE WITH HOME GLUCOSE MONITORING, DIETARY RESTRICTIONS INCLUDING AVOIDANCE OF SUGARY DRINKS/PROCESSED FOODS,  ALONG WITH REGULAR EXERCISE.  I  ALSO STRESSED THE IMPORTANCE OF ANNUAL EYE EXAMS, SELF FOOT CARE AND COMPLIANCE WITH OFFICE VISITS.

## 2024-06-11 ENCOUNTER — Ambulatory Visit: Payer: Self-pay | Admitting: Internal Medicine

## 2024-06-11 LAB — CMP14+EGFR
ALT: 18 IU/L (ref 0–32)
AST: 21 IU/L (ref 0–40)
Albumin: 4.5 g/dL (ref 3.9–4.9)
Alkaline Phosphatase: 81 IU/L (ref 44–121)
BUN/Creatinine Ratio: 16 (ref 12–28)
BUN: 14 mg/dL (ref 8–27)
Bilirubin Total: 0.4 mg/dL (ref 0.0–1.2)
CO2: 23 mmol/L (ref 20–29)
Calcium: 9.6 mg/dL (ref 8.7–10.3)
Chloride: 104 mmol/L (ref 96–106)
Creatinine, Ser: 0.85 mg/dL (ref 0.57–1.00)
Globulin, Total: 2.5 g/dL (ref 1.5–4.5)
Glucose: 102 mg/dL — ABNORMAL HIGH (ref 70–99)
Potassium: 4.1 mmol/L (ref 3.5–5.2)
Sodium: 142 mmol/L (ref 134–144)
Total Protein: 7 g/dL (ref 6.0–8.5)
eGFR: 76 mL/min/1.73 (ref >59)

## 2024-06-11 LAB — MICROALBUMIN / CREATININE URINE RATIO
Creatinine, Urine: 112.4 mg/dL
Microalb/Creat Ratio: 9 mg/g{creat} (ref 0–29)
Microalbumin, Urine: 10.3 ug/mL

## 2024-06-11 LAB — HEMOGLOBIN A1C
Est. average glucose Bld gHb Est-mCnc: 117 mg/dL
Hgb A1c MFr Bld: 5.7 % — ABNORMAL HIGH (ref 4.8–5.6)

## 2024-06-11 LAB — LIPID PANEL
Chol/HDL Ratio: 2.2 ratio (ref 0.0–4.4)
Cholesterol, Total: 164 mg/dL (ref 100–199)
HDL: 75 mg/dL (ref >39)
LDL Chol Calc (NIH): 80 mg/dL (ref 0–99)
Triglycerides: 43 mg/dL (ref 0–149)
VLDL Cholesterol Cal: 9 mg/dL (ref 5–40)

## 2024-06-11 LAB — TSH: TSH: 1.19 u[IU]/mL (ref 0.450–4.500)

## 2024-06-15 NOTE — Telephone Encounter (Signed)
 PAP: Patient assistance application for Janumet  has been approved by PAP Companies: [employer] from 06/11/2024 to 09/30/2024. Medication should be delivered to PAP Delivery: Home. For further shipping updates, please contact [employer] at [phone]. Patient ID is: not provided. Patient first delivery processed today- patient will have to call for refill on 08/31/2024.

## 2024-06-16 ENCOUNTER — Ambulatory Visit (INDEPENDENT_AMBULATORY_CARE_PROVIDER_SITE_OTHER): Payer: Self-pay | Admitting: Otolaryngology

## 2024-06-16 ENCOUNTER — Encounter (INDEPENDENT_AMBULATORY_CARE_PROVIDER_SITE_OTHER): Payer: Self-pay | Admitting: Otolaryngology

## 2024-06-16 VITALS — BP 111/68 | HR 91 | Temp 98.4°F

## 2024-06-16 DIAGNOSIS — H8110 Benign paroxysmal vertigo, unspecified ear: Secondary | ICD-10-CM

## 2024-06-16 DIAGNOSIS — K146 Glossodynia: Secondary | ICD-10-CM | POA: Diagnosis not present

## 2024-06-16 DIAGNOSIS — R42 Dizziness and giddiness: Secondary | ICD-10-CM

## 2024-06-16 NOTE — Progress Notes (Signed)
 ENT CONSULT:  Reason for Consult: thrush   HPI: Discussed the use of AI scribe software for clinical note transcription with the patient, who gave verbal consent to proceed.  History of Present Illness Jasmine Rodriguez is a 68 year old female with diabetes who presents with persistent oral thrush. She was referred for evaluation of persistent thrush.  She has been experiencing oral thrush for over a month, which has not responded to initial treatments. She initially used [employer] but had to discontinue Maalox due to intolerance or possible allergy. She also used nystatin , but the timeline of completion is unclear.  She describes her tongue as being coated white and reports a burning sensation in her mouth, which she describes as 'itchy burning'.  She has a history of diabetes and is currently on oral medications for management. She mentions significant weight loss but continues on oral diabetic medications.  She experiences dizziness, described as vertigo, that occurs mostly with movement but not consistently with every movement.   Records Reviewed:  Office visit 06/10/24 Jasmine Rodriguez is a 68 year old female with diabetes who presents for a physical and diabetes check.   She experiences urinary urgency, characterized by a frequent and urgent need to urinate, which she attributes to her diabetes. There is no associated pain, discomfort, or bladder spasms, and she denies any dysuria. She consumes three to four bottles of water daily.   She experiences dizziness and imbalance, particularly upon standing. She needs to stand still for a few minutes to regain balance and sometimes requires support. The dizziness is periodic and has been ongoing for a while. There is no vertigo when she is in bed or turning over, but she feels off balance when standing up. She has an upcoming appointment with an ENT specialist.   She experiences low back pain that worsens after prolonged sitting,  leading her to bend her knees for relief. She is currently taking amlodipine  2.5 mg daily for blood pressure and has used muscle relaxers in the past, prescribed by a nurse practitioner in February. She is unsure if she has any remaining muscle relaxers but is open to a refill.   She engages in physical activity by walking her dog five to six days a week and doing yard work. Her bowel movements are regular, occurring once or twice daily.    Past Medical History:  Diagnosis Date   DDD (degenerative disc disease)    DM (diabetes mellitus) (HCC)    GERD (gastroesophageal reflux disease)    High cholesterol    HTN (hypertension)     Past Surgical History:  Procedure Laterality Date   BREAST REDUCTION SURGERY     CESAREAN SECTION     KNEE SURGERY     TUBAL LIGATION      Family History  Problem Relation Age of Onset   Hypertension Mother    Arthritis Mother    Congestive Heart Failure Mother    Hypertension Father    Head & neck cancer Father     Social History:  reports that she has never smoked. She has never used smokeless tobacco. She reports that she does not drink alcohol and does not use drugs.  Allergies:  Allergies  Allergen Reactions   Skelaxin [Metaxalone] Anaphylaxis, Itching and Rash   Lisinopril     Medications: I have reviewed the patient's current medications.  The PMH, PSH, Medications, Allergies, and SH were reviewed and updated.  ROS: Constitutional: Negative for fever, weight loss  and weight gain. Cardiovascular: Negative for chest pain and dyspnea on exertion. Respiratory: Is not experiencing shortness of breath at rest. Gastrointestinal: Negative for nausea and vomiting. Neurological: Negative for headaches. Psychiatric: The patient is not nervous/anxious  Blood pressure 111/68, pulse 91, temperature 98.4 F (36.9 C), SpO2 95%. There is no height or weight on file to calculate BMI.  PHYSICAL EXAM: Exam: General: Well-developed,  well-nourished Respiratory Respiratory effort: Equal inspiration and expiration without stridor Cardiovascular Peripheral Vascular: Warm extremities with equal color/perfusion Eyes: No nystagmus with equal extraocular motion bilaterally Neuro/Psych/Balance: Patient oriented to person, place, and time; Appropriate mood and affect; Gait is intact with no imbalance; Cranial nerves I-XII are intact Head and Face Inspection: Normocephalic and atraumatic without mass or lesion Palpation: Facial skeleton intact without bony stepoffs Salivary Glands: No mass or tenderness  Facial Strength: Facial motility symmetric and full bilaterally ENT Pinna: External ear intact and fully developed External canal: Canal is patent with intact skin Tympanic Membrane: Clear and mobile External Nose: No scar or anatomic deformity Internal Nose: Septum is relatively straight on anterior rhinoscopy. Bilateral inferior turbinate hypertrophy.  Lips, Teeth, and gums: Mucosa and teeth intact and viable Oral cavity/oropharynx: No erythema or exudate, no lesions present Neck Neck and Trachea: Midline trachea without mass or lesion Thyroid : No mass or nodularity Lymphatics: No lymphadenopathy   Assessment/Plan: Encounter Diagnoses  Name Primary?   Vertigo    Burning mouth syndrome Yes   Benign paroxysmal positional vertigo, unspecified laterality     Assessment and Plan Assessment & Plan Burning mouth syndrome Burning sensation in the mouth consistent with burning mouth syndrome, no signs of thrush on exam today. Possible vitamin deficiencies or systemic issues. - Provided handout on burning mouth syndrome with treatment options. - Advised vitamin supplementation with alpha lipoic acid and B12. - Instructed primary care doctor to check B12 and folate levels, assess for anemia.  Vertigo Vertigo with movement likely due to BPPV - Referred to vestibular therapy for evaluation and management. - Scheduled  vestibular rehab sessions through physical therapy department.      Thank you for allowing me to participate in the care of this patient. Please do not hesitate to contact me with any questions or concerns.   Elena Larry, MD Otolaryngology [facility] Phone: [phone] Fax: [phone]    06/16/2024, 2:37 PM

## 2024-06-16 NOTE — Patient Instructions (Signed)
 BURNING MOUTH SYNDROME  1. Stress management helps more than any single other intervention. This could mean using a meditation app like Headspace, working a therapist who does [employer], or finding a counselor through your primary care doctor or an online app like BetterHelp.  2. Capsaicin has been shown to help by "burning out" the pain receptors. This is a chemical that comes from the red (hot) pepper. It works by depleting substance P, the chemical that allows nerves to communicate pain/itch. In your condition, the nerves are overactive in the skin. Depleting their ability to signal will result in loss of ability to signal baseline itch/pain. Apply Capsaicin at prescribed doseage (0.025% for oral mucosa).  Please wear gloves or thoroughly wash hands after application.  If you do get this on another body area that is undesired, using olive or vegetable oil is best to dissolve this chemical but does not work instantaneously. It will burn at first, but that will get better with continued use.  You do need to stick to this treatment for it to be effective. Treat in the following manner. Capsaicin 4x daily to affected area x 1 week Capsaicin 3x daily to affected area x 1 week Capsaicin 2x daily to affected area x 2 weeks Capsaicin 1x daily to affected area until follow-up/as needed.  3. Vitamin B12 daily  4. Make sure your doctor has sent bloodwork to check for any issues with vitamin levels  5. Try taking alpha lipoic acid dose of 600-800 mg/day   Anecdotal treatments from patients: 1. Vitamin E oil (open the capsule and apply it directly to tongue). 2.  Lysine supplementation  Patient Information Sheet: Burning Mouth Syndrome (BMS)**  What is Burning Mouth Syndrome (BMS)? Burning Mouth Syndrome (BMS) is a condition characterized by a persistent, often painful, burning sensation in the mouth without an obvious cause. It can affect the tongue, lips, gums, palate, or  the entire mouth.  Symptoms of BMS: - Burning Sensation: Typically feels like scalding or tingling in the mouth, often worsening as the day progresses. - Dry Mouth: You may feel like your mouth is dry, even though saliva production is normal. - Altered Taste: Some patients experience a bitter or metallic taste in the mouth. - Numbness or Tingling: Occasionally, patients report sensations of numbness or tingling.  Who Gets BMS? BMS most commonly affects: - Women, especially postmenopausal women. - Adults over 45 years old.  Possible Causes: The exact cause of BMS is often unknown, but some contributing factors include: - Nerve Damage: Problems with nerves that control taste and pain. - Hormonal Changes: Especially in women going through menopause. - Nutritional Deficiencies: Low levels of iron, zinc, or vitamin B12. - Dry Mouth: From medications or conditions like Sjgren's syndrome. - Allergies or Reactions: To foods, dental materials, or oral care products. - Stress and Anxiety: Psychological factors may exacerbate symptoms.  #### **Diagnosis:** BMS is a diagnosis of exclusion, meaning it is diagnosed after other causes are ruled out. Your doctor or dentist may perform: - A physical examination. - Blood tests to check for nutritional deficiencies. - Allergy tests or saliva flow tests. - Oral swabs to rule out infections.  #### **Treatment Options:** While there is no definitive cure for BMS, the following treatments may help alleviate symptoms: 1. **Medications**:    - Pain relievers, such as over-the-counter analgesics or prescription medications like tricyclic antidepressants, benzodiazepines, or anticonvulsants.    - Oral rinses with anesthetic properties. 2. **Saliva Substitutes**: To relieve  dry mouth symptoms. 3. **Dietary Changes**: Adjusting your diet to avoid spicy, acidic, or irritating foods. 4. **Vitamin or Mineral Supplements**: If deficiencies are identified. 5.  **Cognitive Behavioral Therapy (CBT)**: For managing anxiety or stress that may worsen symptoms. 6. **Avoid Irritants**: Avoid alcohol, tobacco, and irritating dental products.  #### **Self-care Tips:** - **Stay Hydrated**: Drink plenty of water to keep your mouth moist. - **Avoid Triggers**: Spicy, acidic foods, and alcohol may exacerbate symptoms. - **Practice Good Oral Hygiene**: Use gentle, non-irritating oral care products. - **Stress Management**: Engage in relaxation techniques like meditation or yoga.

## 2024-06-17 ENCOUNTER — Telehealth: Payer: Self-pay | Admitting: Pharmacist

## 2024-06-17 DIAGNOSIS — E1169 Type 2 diabetes mellitus with other specified complication: Secondary | ICD-10-CM

## 2024-06-17 NOTE — Progress Notes (Signed)
   06/17/2024  Patient ID: Jasmine Rodriguez, female   DOB: [birth year], 68 y.o.   MRN: [id number]  Patient was called to follow up on medication assistance and to provide the following information:  Patient assistance application for Janumet  has been approved by PAP Companies: [employer] from 06/11/2024 to 09/30/2024. Medication should be delivered to PAP Delivery: Home. For further shipping updates, please contact [employer] at [phone]. Patient ID is: not provided. Patient first delivery processed today- patient will have to call for refill on 08/31/2024.    Unfortunately, she did not answer the phone. HIPAA compliant message was left. A1c 5.7%  Plan: Follow up with Patient in October or November for re-enrollment.   Cassius DOROTHA Brought, PharmD, BCACP Clinical Pharmacist [phone]

## 2024-06-20 DIAGNOSIS — N3941 Urge incontinence: Secondary | ICD-10-CM | POA: Insufficient documentation

## 2024-06-20 NOTE — Assessment & Plan Note (Signed)
 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 - Refill muscle relaxers if needed. - Advise taking muscle relaxers earlier in the evening to avoid next-day grogginess.

## 2024-06-20 NOTE — Assessment & Plan Note (Signed)
 Urge incontinence with urinary urgency. - Encourage pelvic floor exercises.

## 2024-06-20 NOTE — Assessment & Plan Note (Addendum)
 Intermittent dizziness and imbalance, possibly related to ear issues. - Attend ENT appointment on September 16th. - Consider brain imaging after ENT evaluation if symptoms persist. - Meclizine  refilled per her request - There are features that do not suggest vertigo - Encouraged to stay well hydrated

## 2024-06-20 NOTE — Assessment & Plan Note (Signed)

## 2024-06-20 NOTE — Assessment & Plan Note (Signed)
 Chronic, fair control.  Goal BP<130/80.   EKG performed, NSR w/o acute changes.  Essential hypertension managed with amlodipine  2.5 mg daily. - Continue amlodipine  2.5 mg daily - Consider increasing dose if unable to reach optimal BP level - She is intolerant of Ace-inhibitors

## 2024-06-30 ENCOUNTER — Other Ambulatory Visit: Payer: Self-pay

## 2024-06-30 MED ORDER — CONTOUR NEXT TEST VI STRP: ORAL_STRIP | 12 refills | Status: AC

## 2024-06-30 MED ORDER — ACCU-CHEK SOFTCLIX LANCETS MISC: 12 refills | Status: DC

## 2024-07-01 ENCOUNTER — Ambulatory Visit: Payer: Self-pay

## 2024-07-01 ENCOUNTER — Other Ambulatory Visit: Payer: Self-pay

## 2024-07-01 VITALS — BP 122/62 | HR 73 | Temp 98.7°F | Ht 66.0 in | Wt 164.0 lb

## 2024-07-01 DIAGNOSIS — Z0000 Encounter for general adult medical examination without abnormal findings: Secondary | ICD-10-CM

## 2024-07-01 NOTE — Patient Instructions (Signed)
 Jasmine Rodriguez,  Thank you for taking the time for your Medicare Wellness Visit. I appreciate your continued commitment to your health goals. Please review the care plan we discussed, and feel free to reach out if I can assist you further.  Medicare recommends these wellness visits once per year to help you and your care team stay ahead of potential health issues. These visits are designed to focus on prevention, allowing your provider to concentrate on managing your acute and chronic conditions during your regular appointments.  Please note that Annual Wellness Visits do not include a physical exam. Some assessments may be limited, especially if the visit was conducted virtually. If needed, we may recommend a separate in-person follow-up with your provider.  Ongoing Care Seeing your primary care provider every 3 to 6 months helps us  monitor your health and provide consistent, personalized care.   Referrals If a referral was made during today's visit and you haven't received any updates within two weeks, please contact the referred provider directly to check on the status.  Recommended Screenings:  Health Maintenance  Topic Date Due   Eye exam for diabetics  03/04/2024   Complete foot exam   06/05/2024   COVID-19 Vaccine (5 - 2025-26 season) 07/17/2024*   Pneumococcal Vaccine for age over 28 (1 of 2 - PCV) 06/10/2025*   Hemoglobin A1C  12/08/2024   Breast Cancer Screening  01/09/2025   Yearly kidney function blood test for diabetes  06/10/2025   Yearly kidney health urinalysis for diabetes  06/10/2025   Medicare Annual Wellness Visit  07/01/2025   DTaP/Tdap/Td vaccine (2 - Td or Tdap) 10/14/2029   Colon Cancer Screening  01/05/2034   Flu Shot  Completed   DEXA scan (bone density measurement)  Completed   Hepatitis C Screening  Completed   Zoster (Shingles) Vaccine  Completed   HPV Vaccine  Aged Out   Meningitis B Vaccine  Aged Out  *Topic was postponed. The date shown is not the  original due date.       07/01/2024    8:44 AM  Advanced Directives  Does Patient Have a Medical Advance Directive? No  Would patient like information on creating a medical advance directive? Yes (MAU/Ambulatory/Procedural Areas - Information given)   Advance Care Planning is important because it: Ensures you receive medical care that aligns with your values, goals, and preferences. Provides guidance to your family and loved ones, reducing the emotional burden of decision-making during critical moments.  Vision: Annual vision screenings are recommended for early detection of glaucoma, cataracts, and diabetic retinopathy. These exams can also reveal signs of chronic conditions such as diabetes and high blood pressure.  Dental: Annual dental screenings help detect early signs of oral cancer, gum disease, and other conditions linked to overall health, including heart disease and diabetes.  Please see the attached documents for additional preventive care recommendations.

## 2024-07-01 NOTE — Progress Notes (Signed)
 Subjective:   AYARI LIWANAG is a 68 y.o. who presents for a Medicare Wellness preventive visit.  As a reminder, Annual Wellness Visits don't include a physical exam, and some assessments may be limited, especially if this visit is performed virtually. We may recommend an in-person follow-up visit with your provider if needed.  Visit Complete: In person    Persons Participating in Visit: Patient.  AWV Questionnaire: No: Patient Medicare AWV questionnaire was not completed prior to this visit.  Cardiac Risk Factors include: advanced age (>78men, >8 women);diabetes mellitus;dyslipidemia;hypertension     Objective:    Today's Vitals   07/01/24 0835 07/01/24 0836  BP: 122/62   Pulse: 73   Temp: 98.7 F (37.1 C)   TempSrc: Oral   SpO2: 97%   Weight: 164 lb (74.4 kg)   Height: 5' 6 (1.676 m)   PainSc:  2    Body mass index is 26.47 kg/m.     07/01/2024    8:44 AM 06/06/2023   10:27 AM 12/19/2020    2:35 PM  Advanced Directives  Does Patient Have a Medical Advance Directive? No No No  Would patient like information on creating a medical advance directive? Yes (MAU/Ambulatory/Procedural Areas - Information given) Yes (MAU/Ambulatory/Procedural Areas - Information given) No - Patient declined    Current Medications (verified) Outpatient Encounter Medications as of 07/01/2024  Medication Sig   Accu-Chek Softclix Lancets lancets Use as instructed   acetaminophen (TYLENOL) 500 MG tablet Take 500 mg by mouth every 6 (six) hours as needed.   albuterol  (PROAIR  HFA) 108 (90 Base) MCG/ACT inhaler Inhale 2 puffs into the lungs every 6 (six) hours as needed for wheezing or shortness of breath.   amLODipine  (NORVASC ) 2.5 MG tablet TAKE 1 TABLET BY MOUTH EVERY DAY   Calcium Carbonate (CALTRATE 600 PO) Take by mouth. daily   cetirizine  (ZYRTEC  ALLERGY) 10 MG tablet Take 1 tablet (10 mg total) by mouth daily.   cyclobenzaprine  (FLEXERIL ) 10 MG tablet Take 1 tablet (10 mg total) by  mouth at bedtime as needed for muscle spasms.   fluocinonide  (LIDEX ) 0.05 % external solution APPLY 1 ML TO THE AFFECTED AREA NIGHTLY AS NEEDED   glucosamine-chondroitin 500-400 MG tablet Take 1 tablet by mouth 2 (two) times daily.   glucose blood (CONTOUR NEXT TEST) test strip USE AS INSTRUCTED TO CHECK BLOOD SUGARS DAILY E11.69   glucose blood (CONTOUR NEXT TEST) test strip Use as instructed   JANUMET  50-500 MG tablet TAKE 1 TABLET BY MOUTH TWICE A DAY   lactase (LACTAID) 3000 UNITS tablet Take 1 tablet by mouth 3 (three) times daily as needed. Dairy consumption   meclizine  (ANTIVERT ) 12.5 MG tablet Take 1 tablet (12.5 mg total) by mouth 2 (two) times daily as needed for dizziness.   Microlet Lancets MISC Use as directed to check blood sugars E11.69   Multiple Vitamins-Minerals (MULTIPLE VITAMINS/WOMENS PO) Take 1 tablet by mouth daily.   nystatin  (MYCOSTATIN /NYSTOP ) powder USE AS DIRECTED   omeprazole  (PRILOSEC) 20 MG capsule TAKE 1 CAPSULE BY MOUTH EVERY DAY   pravastatin  (PRAVACHOL ) 80 MG tablet Take 1 tablet (80 mg total) by mouth every evening.   valACYclovir  (VALTREX ) 500 MG tablet TAKE 1 TABLET BY MOUTH EVERY DAY   No facility-administered encounter medications on file as of 07/01/2024.    Allergies (verified) Skelaxin [metaxalone] and Lisinopril   History: Past Medical History:  Diagnosis Date   DDD (degenerative disc disease)    DM (diabetes mellitus) (HCC)  GERD (gastroesophageal reflux disease)    High cholesterol    HTN (hypertension)    Past Surgical History:  Procedure Laterality Date   BREAST REDUCTION SURGERY     CESAREAN SECTION     KNEE SURGERY     TUBAL LIGATION     Family History  Problem Relation Age of Onset   Hypertension Mother    Arthritis Mother    Congestive Heart Failure Mother    Hypertension Father    Head & neck cancer Father    Social History   Socioeconomic History   Marital status: Divorced    Spouse name: Not on file   Number of  children: Not on file   Years of education: Not on file   Highest education level: Not on file  Occupational History   Not on file  Tobacco Use   Smoking status: Never   Smokeless tobacco: Never  Vaping Use   Vaping status: Never Used  Substance and Sexual Activity   Alcohol use: No   Drug use: No   Sexual activity: Not on file  Other Topics Concern   Not on file  Social History Narrative   Not on file   Social Drivers of Health   Financial Resource Strain: Low Risk  (07/01/2024)   Overall Financial Resource Strain (CARDIA)    Difficulty of Paying Living Expenses: Not hard at all  Food Insecurity: No Food Insecurity (07/01/2024)   Hunger Vital Sign    Worried About Running Out of Food in the Last Year: Never true    Ran Out of Food in the Last Year: Never true  Transportation Needs: No Transportation Needs (07/01/2024)   PRAPARE - Administrator, Civil Service (Medical): No    Lack of Transportation (Non-Medical): No  Physical Activity: Sufficiently Active (07/01/2024)   Exercise Vital Sign    Days of Exercise per Week: 7 days    Minutes of Exercise per Session: 30 min  Stress: No Stress Concern Present (07/01/2024)   [employer] of Occupational Health - Occupational Stress Questionnaire    Feeling of Stress: Not at all  Social Connections: Moderately Isolated (07/01/2024)   Social Connection and Isolation Panel    Frequency of Communication with Friends and Family: More than three times a week    Frequency of Social Gatherings with Friends and Family: More than three times a week    Attends Religious Services: More than 4 times per year    Active Member of [employer] or Organizations: No    Attends Engineer, structural: Never    Marital Status: Divorced    Tobacco Counseling Counseling given: Not Answered    Clinical Intake:  Pre-visit preparation completed: Yes  Pain : 0-10 Pain Score: 2  Pain Type: Chronic pain Pain Location: Back Pain  Orientation: Lower Pain Descriptors / Indicators: Aching Pain Onset: More than a month ago Pain Frequency: Intermittent     Nutritional Status: BMI 25 -29 Overweight Nutritional Risks: None Diabetes: Yes CBG done?: No Did pt. bring in CBG monitor from home?: No  Lab Results  Component Value Date   HGBA1C 5.7 (H) 06/10/2024   HGBA1C 6.0 (H) 02/05/2024   HGBA1C 5.9 (H) 10/08/2023     How often do you need to have someone help you when you read instructions, pamphlets, or other written materials from your doctor or pharmacy?: 1 - Never  Interpreter Needed?: No  Information entered by :: NAllen LPN   Activities of Daily  Living     07/01/2024    8:38 AM  In your present state of health, do you have any difficulty performing the following activities:  Hearing? 0  Vision? 0  Comment blurring sometimes  Difficulty concentrating or making decisions? 0  Walking or climbing stairs? 0  Dressing or bathing? 0  Doing errands, shopping? 0  Preparing Food and eating ? N  Using the Toilet? N  In the past six months, have you accidently leaked urine? Y  Comment if held too long  Do you have problems with loss of bowel control? N  Managing your Medications? N  Managing your Finances? N  Housekeeping or managing your Housekeeping? N    Patient Care Team: Jarold Medici, MD as PCP - General (Internal Medicine) Rosalynn LELON Ingle, MD (Inactive) as Attending Physician (Obstetrics and Gynecology) Cyrus Carwin, MD as Consulting Physician (Ophthalmology)  I have updated your Care Teams any recent Medical Services you may have received from other providers in the past year.     Assessment:   This is a routine wellness examination for Olesya.  Hearing/Vision screen Hearing Screening - Comments:: Denies hearing issues Vision Screening - Comments:: Regular eye exams, Dr. Cyrus   Goals Addressed             This Visit's Progress    Patient Stated       07/01/2024, maintain        Depression Screen     07/01/2024    8:47 AM 02/20/2024   11:50 AM 02/05/2024   11:44 AM 06/06/2023    9:47 AM 02/07/2023    9:29 AM 05/23/2022    1:39 PM 11/27/2021    9:03 AM  PHQ 2/9 Scores  PHQ - 2 Score 0 0 0 0 0 0 2  PHQ- 9 Score  0 0 0       Fall Risk     07/01/2024    8:46 AM 06/10/2024    8:41 AM 02/20/2024   11:50 AM 02/05/2024   11:43 AM 10/08/2023   11:21 AM  Fall Risk   Falls in the past year? 0 0 0 0 0  Number falls in past yr: 0 0 0 0 0  Injury with Fall? 0 0 0 0 0  Risk for fall due to : Medication side effect No Fall Risks No Fall Risks No Fall Risks No Fall Risks  Follow up Falls evaluation completed;Falls prevention discussed Falls evaluation completed Falls evaluation completed Falls evaluation completed Falls evaluation completed    MEDICARE RISK AT HOME:  Medicare Risk at Home Any stairs in or around the home?: No If so, are there any without handrails?: No Home free of loose throw rugs in walkways, pet beds, electrical cords, etc?: Yes Adequate lighting in your home to reduce risk of falls?: Yes Life alert?: No Use of a cane, walker or w/c?: No Grab bars in the bathroom?: Yes Shower chair or bench in shower?: No Elevated toilet seat or a handicapped toilet?: Yes  TIMED UP AND GO:  Was the test performed?  Yes  Length of time to ambulate 10 feet: 5 sec Gait steady and fast without use of assistive device  Cognitive Function: 6CIT completed      06/12/2022    8:32 AM  Montreal Cognitive Assessment   Visuospatial/ Executive (0/5) 5  Naming (0/3) 3  Attention: Read list of digits (0/2) 2  Attention: Read list of letters (0/1) 1  Attention: Serial 7 subtraction starting  at 100 (0/3) 3  Language: Repeat phrase (0/2) 2  Language : Fluency (0/1) 1  Abstraction (0/2) 2  Delayed Recall (0/5) 0  Orientation (0/6) 6  Total 25      07/01/2024    8:47 AM 06/06/2023    9:56 AM  6CIT Screen  What Year? 0 points 0 points  What month? 0 points 0 points   What time? 3 points 0 points  Count back from 20 0 points 0 points  Months in reverse 0 points 0 points  Repeat phrase 10 points 8 points  Total Score 13 points 8 points    Immunizations Immunization History  Administered Date(s) Administered   Fluad Quad(high Dose 65+) 06/19/2023, 06/05/2024   Influenza,inj,Quad PF,6+ Mos 06/11/2019   Influenza-Unspecified 07/17/2018, 07/04/2020, 07/24/2022   Moderna Covid Bivalent Peds Booster(65mo Thru 56yrs) 10/20/2021   PFIZER(Purple Top)SARS-COV-2 Vaccination 12/16/2019, 01/09/2020   Pfizer Covid-19 Vaccine Bivalent Booster 7yrs & up 07/24/2022   Tdap 10/15/2019   Zoster Recombinant(Shingrix ) 10/08/2023    Screening Tests Health Maintenance  Topic Date Due   OPHTHALMOLOGY EXAM  03/04/2024   FOOT EXAM  06/05/2024   COVID-19 Vaccine (5 - 2025-26 season) 07/17/2024 (Originally 06/01/2024)   Pneumococcal Vaccine: 50+ Years (1 of 2 - PCV) 06/10/2025 (Originally 05/26/1975)   HEMOGLOBIN A1C  12/08/2024   Mammogram  01/09/2025   Diabetic kidney evaluation - eGFR measurement  06/10/2025   Diabetic kidney evaluation - Urine ACR  06/10/2025   Medicare Annual Wellness (AWV)  07/01/2025   DTaP/Tdap/Td (2 - Td or Tdap) 10/14/2029   Colonoscopy  01/05/2034   Influenza Vaccine  Completed   DEXA SCAN  Completed   Hepatitis C Screening  Completed   Zoster Vaccines- Shingrix   Completed   HPV VACCINES  Aged Out   Meningococcal B Vaccine  Aged Out    Health Maintenance Items Addressed: Diabetic Foot Exam recommended  Additional Screening:  Vision Screening: Recommended annual ophthalmology exams for early detection of glaucoma and other disorders of the eye. Is the patient up to date with their annual eye exam?  Yes  Who is the provider or what is the name of the office in which the patient attends annual eye exams? Dr. Cyrus  Dental Screening: Recommended annual dental exams for proper oral hygiene  Community Resource Referral / Chronic Care  Management: CRR required this visit?  No   CCM required this visit?  No   Plan:    I have personally reviewed and noted the following in the patient's chart:   Medical and social history Use of alcohol, tobacco or illicit drugs  Current medications and supplements including opioid prescriptions. Patient is not currently taking opioid prescriptions. Functional ability and status Nutritional status Physical activity Advanced directives List of other physicians Hospitalizations, surgeries, and ER visits in previous 12 months Vitals Screenings to include cognitive, depression, and falls Referrals and appointments  In addition, I have reviewed and discussed with patient certain preventive protocols, quality metrics, and best practice recommendations. A written personalized care plan for preventive services as well as general preventive health recommendations were provided to patient.   Ardella FORBES Dawn, LPN   89/05/7973   After Visit Summary: (In Person-Printed) AVS printed and given to the patient  Notes: Nothing significant to report at this time.

## 2024-07-02 ENCOUNTER — Other Ambulatory Visit: Payer: Self-pay | Admitting: Internal Medicine

## 2024-07-07 ENCOUNTER — Encounter: Payer: Self-pay | Admitting: Nurse Practitioner

## 2024-07-07 ENCOUNTER — Ambulatory Visit (INDEPENDENT_AMBULATORY_CARE_PROVIDER_SITE_OTHER): Admitting: Nurse Practitioner

## 2024-07-07 ENCOUNTER — Ambulatory Visit: Payer: Self-pay

## 2024-07-07 VITALS — BP 110/62 | HR 78 | Temp 99.4°F | Ht 66.0 in | Wt 163.6 lb

## 2024-07-07 DIAGNOSIS — L608 Other nail disorders: Secondary | ICD-10-CM

## 2024-07-07 DIAGNOSIS — Z6826 Body mass index (BMI) 26.0-26.9, adult: Secondary | ICD-10-CM

## 2024-07-07 DIAGNOSIS — E1169 Type 2 diabetes mellitus with other specified complication: Secondary | ICD-10-CM | POA: Diagnosis not present

## 2024-07-07 DIAGNOSIS — E785 Hyperlipidemia, unspecified: Secondary | ICD-10-CM | POA: Diagnosis not present

## 2024-07-07 DIAGNOSIS — E663 Overweight: Secondary | ICD-10-CM

## 2024-07-07 DIAGNOSIS — M79674 Pain in right toe(s): Secondary | ICD-10-CM

## 2024-07-07 DIAGNOSIS — M79675 Pain in left toe(s): Secondary | ICD-10-CM | POA: Insufficient documentation

## 2024-07-07 DIAGNOSIS — I10 Essential (primary) hypertension: Secondary | ICD-10-CM | POA: Diagnosis not present

## 2024-07-07 NOTE — Assessment & Plan Note (Signed)
 numbness and tingling in both feet likely related to diabetic neuropathy. No acute changes in blood glucose levels. - Advise to continue regular blood glucose monitoring. - Instruct to report any significant changes in blood glucose levels, especially if associated with toe symptoms.

## 2024-07-07 NOTE — Assessment & Plan Note (Signed)
 Thickened, detached nail with possible fungal infection. Concern for infection is low however due to diabetes if she has worsening symptoms of erythema or hot to touch return call to office for possible antibiotics. Differential includes fungal and possible bacterial infection (low suspicion). - Refer to podiatry for evaluation and management of nail dystrophy and possible fungal infection. - Advise to monitor for changes such as increased redness or warmth, and report if these occur. - Consider antibiotics if symptoms suggestive of infection develop, such as increased redness or elevated blood glucose.

## 2024-07-07 NOTE — Assessment & Plan Note (Signed)
 Blood pressure is well controlled. Continue current medications.

## 2024-07-07 NOTE — Progress Notes (Signed)
 LILLETTE Kristeen JINNY Gladis, CMA,acting as a Neurosurgeon for Gaines Ada, FNP.,have documented all relevant documentation on the behalf of Gaines Ada, FNP,as directed by  Gaines Ada, FNP while in the presence of Gaines Ada, FNP.  Subjective:  Patient ID: Jasmine Rodriguez , female    DOB: November 07, 1955 , 68 y.o.   MRN: [id number]  Chief Complaint  Patient presents with   Toe Pain    Patient reports pain in her right great toe, she reports it is numb when she touches it. Patient reports the pain has now subsided. Patient reports it looks better today but yesterday it was red and tender. Patient reports she reports the pain has been going on for a couple days.      HPI  Discussed the use of AI scribe software for clinical note transcription with the patient, who gave verbal consent to proceed.  History of Present Illness AYIANA WINSLETT is a 67 year old female with diabetes who presents with right toe pain.  She has been experiencing pain in her right toe for a couple of days, starting around Saturday or Sunday. Initially, the pain was accompanied by redness and tenderness, but these symptoms have since subsided. Currently, the toe is numb when touched and was warm and red yesterday. She experiences discomfort when wearing shoes and sensitivity to bed sheets touching the toe.  She denies any history of gout and does not recall any unusual dietary intake, such as fish, beef, or alcohol, prior to the onset of symptoms. She took Tylenol once for the pain, which did not provide significant relief. No fever is present.  She has not seen a foot doctor in over three years and cannot recall the specific reason for her last visit.  She has a history of diabetes and experiences numbness and tingling in both feet, which she attributes to neuropathy. The numbness affects the entire foot, including the sole.   Past Medical History:  Diagnosis Date   DDD (degenerative disc disease)    DM (diabetes mellitus) (HCC)     GERD (gastroesophageal reflux disease)    High cholesterol    HTN (hypertension)      Family History  Problem Relation Age of Onset   Hypertension Mother    Arthritis Mother    Congestive Heart Failure Mother    Hypertension Father    Head & neck cancer Father      Current Outpatient Medications:    Accu-Chek Softclix Lancets lancets, USE AS INSTRUCTED, Disp: 100 each, Rfl: 12   acetaminophen (TYLENOL) 500 MG tablet, Take 500 mg by mouth every 6 (six) hours as needed., Disp: , Rfl:    albuterol  (PROAIR  HFA) 108 (90 Base) MCG/ACT inhaler, Inhale 2 puffs into the lungs every 6 (six) hours as needed for wheezing or shortness of breath., Disp: 18 g, Rfl: 3   amLODipine  (NORVASC ) 2.5 MG tablet, TAKE 1 TABLET BY MOUTH EVERY DAY, Disp: 90 tablet, Rfl: 1   Calcium Carbonate (CALTRATE 600 PO), Take by mouth. daily, Disp: , Rfl:    cetirizine  (ZYRTEC  ALLERGY) 10 MG tablet, Take 1 tablet (10 mg total) by mouth daily., Disp: 90 tablet, Rfl: 2   cyclobenzaprine  (FLEXERIL ) 10 MG tablet, Take 1 tablet (10 mg total) by mouth at bedtime as needed for muscle spasms., Disp: 30 tablet, Rfl: 0   fluocinonide  (LIDEX ) 0.05 % external solution, APPLY 1 ML TO THE AFFECTED AREA NIGHTLY AS NEEDED, Disp: 60 mL, Rfl: 3   glucosamine-chondroitin 500-400 MG tablet,  Take 1 tablet by mouth 2 (two) times daily., Disp: , Rfl:    glucose blood (CONTOUR NEXT TEST) test strip, USE AS INSTRUCTED TO CHECK BLOOD SUGARS DAILY E11.69, Disp: 100 strip, Rfl: 12   glucose blood (CONTOUR NEXT TEST) test strip, Use as instructed, Disp: 100 each, Rfl: 12   JANUMET  50-500 MG tablet, TAKE 1 TABLET BY MOUTH TWICE A DAY, Disp: 60 tablet, Rfl: 8   lactase (LACTAID) 3000 UNITS tablet, Take 1 tablet by mouth 3 (three) times daily as needed. Dairy consumption, Disp: , Rfl:    meclizine  (ANTIVERT ) 12.5 MG tablet, Take 1 tablet (12.5 mg total) by mouth 2 (two) times daily as needed for dizziness., Disp: 20 tablet, Rfl: 0   Microlet Lancets  MISC, Use as directed to check blood sugars E11.69, Disp: 100 each, Rfl: 2   Multiple Vitamins-Minerals (MULTIPLE VITAMINS/WOMENS PO), Take 1 tablet by mouth daily., Disp: , Rfl:    nystatin  (MYCOSTATIN /NYSTOP ) powder, USE AS DIRECTED, Disp: 60 g, Rfl: 1   omeprazole  (PRILOSEC) 20 MG capsule, TAKE 1 CAPSULE BY MOUTH EVERY DAY, Disp: 90 capsule, Rfl: 2   pravastatin  (PRAVACHOL ) 80 MG tablet, Take 1 tablet (80 mg total) by mouth every evening., Disp: 90 tablet, Rfl: 2   valACYclovir  (VALTREX ) 500 MG tablet, TAKE 1 TABLET BY MOUTH EVERY DAY, Disp: 30 tablet, Rfl: 5   Allergies  Allergen Reactions   Skelaxin [Metaxalone] Anaphylaxis, Itching and Rash   Lisinopril      Review of Systems  Constitutional: Negative.   Respiratory: Negative.    Cardiovascular: Negative.   Skin:        Right toenail bed pain with numbness.   Neurological: Negative.   Psychiatric/Behavioral: Negative.       Today's Vitals   07/07/24 1107  BP: 110/62  Pulse: 78  Temp: 99.4 F (37.4 C)  TempSrc: Oral  Weight: 163 lb 9.6 oz (74.2 kg)  Height: 5' 6 (1.676 m)  PainSc: 0-No pain   Body mass index is 26.41 kg/m.  Wt Readings from Last 3 Encounters:  07/07/24 163 lb 9.6 oz (74.2 kg)  07/01/24 164 lb (74.4 kg)  06/10/24 162 lb 9.6 oz (73.8 kg)     Objective:  Physical Exam Vitals and nursing note reviewed.  Constitutional:      Appearance: She is well-developed.  Eyes:     Pupils: Pupils are equal, round, and reactive to light.  Pulmonary:     Effort: Pulmonary effort is normal. No respiratory distress.  Skin:    General: Skin is warm and dry.     Capillary Refill: Capillary refill takes less than 2 seconds.     Comments: Right great toe thickened and tender to touch. Mild erythema underneath cuticle area, not hot to touch.   Neurological:     General: No focal deficit present.     Mental Status: She is alert and oriented to person, place, and time.     Cranial Nerves: No cranial nerve deficit.   Psychiatric:        Mood and Affect: Mood normal.         Assessment And Plan:  Pain of right great toe Assessment & Plan: Thickened, detached nail with possible fungal infection. Concern for infection is low however due to diabetes if she has worsening symptoms of erythema or hot to touch return call to office for possible antibiotics. Differential includes fungal and possible bacterial infection (low suspicion). - Refer to podiatry for evaluation and management of  nail dystrophy and possible fungal infection. - Advise to monitor for changes such as increased redness or warmth, and report if these occur. - Consider antibiotics if symptoms suggestive of infection develop, such as increased redness or elevated blood glucose.  Orders: -     Ambulatory referral to Podiatry  Overweight with body mass index (BMI) of 26 to 26.9 in adult  Essential hypertension, benign Assessment & Plan: Blood pressure is well controlled. Continue current medications.    Discoloration of nailbeds -     Ambulatory referral to Podiatry  Type 2 diabetes mellitus with hyperlipidemia (HCC) Assessment & Plan: numbness and tingling in both feet likely related to diabetic neuropathy. No acute changes in blood glucose levels. - Advise to continue regular blood glucose monitoring. - Instruct to report any significant changes in blood glucose levels, especially if associated with toe symptoms.     Return for keep same next.  Patient was given opportunity to ask questions. Patient verbalized understanding of the plan and was able to repeat key elements of the plan. All questions were answered to their satisfaction.    LILLETTE Gaines Ada, FNP, have reviewed all documentation for this visit. The documentation on 07/07/24 for the exam, diagnosis, procedures, and orders are all accurate and complete.   IF YOU HAVE BEEN REFERRED TO A SPECIALIST, IT MAY TAKE 1-2 WEEKS TO SCHEDULE/PROCESS THE REFERRAL. IF YOU HAVE NOT  HEARD FROM US /SPECIALIST IN TWO WEEKS, PLEASE GIVE US  A CALL AT [phone].

## 2024-07-07 NOTE — Telephone Encounter (Signed)
 FYI Only or Action Required?: FYI only for provider.  Patient was last seen in primary care on 06/10/2024 by Jarold Medici, MD.  Called Nurse Triage reporting Nail Problem.  Symptoms began several months ago.  Interventions attempted: OTC medications: Antifungal med.  Symptoms are: gradually worsening.  Triage Disposition: See Physician Within 24 Hours  Patient/caregiver understands and will follow disposition?: Yes      Copied from CRM #[id number]. Topic: Clinical - Red Word Triage >> Jul 07, 2024  8:47 AM Carlatta H wrote: [facility] that prompted transfer to Nurse Triage: Big toe on right foot is infected//Red, swollen and toe nail seems to be separating from toe// Reason for Disposition  [1] Looks infected (spreading redness, red streak, pus) AND [2] no fever  Answer Assessment - Initial Assessment Questions Tenderness in foot area. Pt states the nail is separating from the nail bed. Used antifungal meds for symptoms.   1. MAIN CONCERN OR SYMPTOM: What is your main concern right now?      Toenail separating from nailbed 2. LOCATION: Which toe?      R foot great toe  3. APPEARANCE: What does toenail look like? (e.g., thickened, white, yellow, or brown color)     Nail looks tan and is turning darker  4. ONSET: When did it start? (e.g., days, weeks, months ago)     Months  5. PAIN: Is there any pain? If Yes, ask: How bad is the pain? (Scale 0-10; or none, mild, moderate, severe)     Denies  6. REDNESS: Is there any redness of the skin? If Yes, ask: How much of the toe is red?     Slight redness  7. OTHER SYMPTOMS: Do you have any other symptoms? (e.g., fever, pus, red streak up foot)     Denies  Protocols used: Toenail Fungal Infection - Diagnosed or Suspected-A-AH

## 2024-07-13 ENCOUNTER — Ambulatory Visit: Admitting: Podiatry

## 2024-07-13 DIAGNOSIS — E1151 Type 2 diabetes mellitus with diabetic peripheral angiopathy without gangrene: Secondary | ICD-10-CM

## 2024-07-13 DIAGNOSIS — M79674 Pain in right toe(s): Secondary | ICD-10-CM | POA: Diagnosis not present

## 2024-07-13 DIAGNOSIS — L84 Corns and callosities: Secondary | ICD-10-CM | POA: Diagnosis not present

## 2024-07-13 DIAGNOSIS — M79675 Pain in left toe(s): Secondary | ICD-10-CM | POA: Diagnosis not present

## 2024-07-13 DIAGNOSIS — B351 Tinea unguium: Secondary | ICD-10-CM

## 2024-07-13 MED ORDER — CICLOPIROX 8 % EX SOLN: Freq: Every day | CUTANEOUS | 11 refills | Status: AC

## 2024-07-13 NOTE — Progress Notes (Unsigned)
 Subjective:  Patient ID: Jasmine Rodriguez, female    DOB: April 05, 1956,  MRN: [id number]  Jasmine Rodriguez presents to clinic today for:  Chief Complaint  Patient presents with   Nail Problem    -Right hallux toe pain under toenail with redness, discoloration of nailbeds.0 Pain.    Patient notes nails are thick and elongated, causing pain in shoe gear when ambulating.  Of most concern is her right great toenail.  She is concerned of fungus in the nail.  States that there may be some early changes noted in the right second toenail as well.  She also has a small corn on the left foot near the fifth metatarsal head.  States her last A1c was 5.7 on 06/10/2024 and she is a type II diabetic.  She is a retired Engineer, civil (consulting)  PCP is Jarold Medici, MD. last seen 06/10/2024  Past Medical History:  Diagnosis Date   DDD (degenerative disc disease)    DM (diabetes mellitus) (HCC)    GERD (gastroesophageal reflux disease)    High cholesterol    HTN (hypertension)    Allergies  Allergen Reactions   Skelaxin [Metaxalone] Anaphylaxis, Itching and Rash   Lisinopril     Objective:  Jasmine Rodriguez is a pleasant 68 y.o. female in NAD. AAO x 3.  Vascular Examination: Patient has 2/4 palpable DP pulse, absent PT pulse bilateral.  Delayed capillary refill bilateral toes.  Sparse digital hair bilateral.  Proximal to distal cooling WNL bilateral.    Dermatological Examination: Interspaces are clear with no open lesions noted bilateral.  Skin is shiny and atrophic bilateral.  Nails are 3-74mm thick, with yellowish/brown discoloration, subungual debris and distal onycholysis x10.  There is pain with compression of nails x10.  There are hyperkeratotic lesions noted left submet 5.     Latest Ref Rng & Units 06/10/2024    9:41 AM 02/05/2024   12:31 PM 10/08/2023   11:47 AM  Hemoglobin A1C  Hemoglobin-A1c 4.8 - 5.6 % 5.7  6.0  5.9    Patient qualifies for at-risk foot care because of diabetes with  PVD.  Assessment/Plan: 1. Pain due to onychomycosis of toenails of both feet   2. Corns   3. Type II diabetes mellitus with peripheral circulatory disorder (HCC)     Meds ordered this encounter  Medications   ciclopirox (PENLAC) 8 % solution    Sig: Apply topically at bedtime. Apply thin layer over nail. Apply daily over previous coat. Remove weekly with polish remover.    Dispense:  6.6 mL    Refill:  11   Mycotic nails x10 were sharply debrided with sterile nail nippers and power debriding burr to decrease bulk and length.  Hyperkeratotic lesions left submet 5 was shaved with #312 blade.  Discussed starting the patient on prescription topical ciclopirox solution.  She was instructed to apply this daily to the toenail and can also apply this to the second toenail since that it seems that it might be getting some early involvement as well.  Remove once weekly with rubbing alcohol or nail polish remover.  May need to use this for 6 to 12 months continuously.  Will recheck at next visit  Return in about 3 months (around 10/13/2024) for [facility].   Awanda CHARM Imperial, DPM, FACFAS [facility]     [address]  [place], [place] [address]                Office [phone]  Fax [phone]

## 2024-07-14 ENCOUNTER — Encounter: Payer: Self-pay | Admitting: Podiatry

## 2024-07-15 DIAGNOSIS — H401111 Primary open-angle glaucoma, right eye, mild stage: Secondary | ICD-10-CM | POA: Diagnosis not present

## 2024-07-15 DIAGNOSIS — H35033 Hypertensive retinopathy, bilateral: Secondary | ICD-10-CM | POA: Diagnosis not present

## 2024-07-15 DIAGNOSIS — E119 Type 2 diabetes mellitus without complications: Secondary | ICD-10-CM | POA: Diagnosis not present

## 2024-07-23 ENCOUNTER — Other Ambulatory Visit: Payer: Self-pay | Admitting: Internal Medicine

## 2024-07-23 NOTE — Telephone Encounter (Unsigned)
 Copied from CRM #[id number]. Topic: Clinical - Medication Refill >> Jul 23, 2024  5:52 PM Lauren C wrote: Medication: valACYclovir  (VALTREX ) 500 MG tablet  Has the patient contacted their pharmacy? Yes No refills.  This is the patient's preferred pharmacy:  [id number] [place], [place] - [address] [place] [address] Phone: [phone] Fax: [phone]   Is this the correct pharmacy for this prescription? Yes If no, delete pharmacy and type the correct one.   Has the prescription been filled recently? Yes  Is the patient out of the medication? No, 6 left  Has the patient been seen for an appointment in the last year OR does the patient have an upcoming appointment? Yes  Can we respond through MyChart? please call at [phone]  Agent: Please be advised that Rx refills may take up to 3 business days. We ask that you follow-up with your pharmacy.

## 2024-07-24 MED ORDER — VALACYCLOVIR HCL 500 MG PO TABS: 500.0000 mg | ORAL_TABLET | Freq: Every day | ORAL | 5 refills | Status: AC

## 2024-08-11 ENCOUNTER — Telehealth: Payer: Self-pay | Admitting: Pharmacist

## 2024-08-11 DIAGNOSIS — E1169 Type 2 diabetes mellitus with other specified complication: Secondary | ICD-10-CM

## 2024-08-11 NOTE — Progress Notes (Signed)
   08/11/2024  Patient ID: Jasmine Rodriguez, female   DOB: [birth year], 68 y.o.   MRN: [id number]  Pharmacy Quality Measure Review  This patient is appearing on a report for being at risk of failing the adherence measure for cholesterol (statin) medications this calendar year.   Medication: Pravastatin  80 mg  Last fill date: 30 for 05/06/21 day supply  Left voicemail for patient to return my call at their convenience. A new Pravastatin  prescription was sent in on 06/10/24 for a 90 day supply.  Called Pharmacy and facilitated a refill as Dr. Jarold sent in a new prescription on 09/10.25 for a 90 day supply.   Jasmine Rodriguez, PharmD, BCACP Clinical Pharmacist [phone]

## 2024-08-13 DIAGNOSIS — H35033 Hypertensive retinopathy, bilateral: Secondary | ICD-10-CM | POA: Diagnosis not present

## 2024-08-13 DIAGNOSIS — H401111 Primary open-angle glaucoma, right eye, mild stage: Secondary | ICD-10-CM | POA: Diagnosis not present

## 2024-08-13 DIAGNOSIS — E119 Type 2 diabetes mellitus without complications: Secondary | ICD-10-CM | POA: Diagnosis not present

## 2024-09-01 ENCOUNTER — Telehealth: Payer: Self-pay

## 2024-09-01 NOTE — Telephone Encounter (Signed)
 PAP: Patient assistance application for Janumet  through [employer] has been mailed to pt's home address on file. Provider portion of application will be faxed to provider's office.

## 2024-09-02 ENCOUNTER — Telehealth: Payer: Self-pay | Admitting: Internal Medicine

## 2024-09-02 NOTE — Telephone Encounter (Signed)
 Copied from CRM #[id number]. Topic: Clinical - Medication Refill >> Sep 02, 2024  4:09 PM Yolanda T wrote: Medication: fluocinonide  (LIDEX ) 0.05 % external solution  Has the patient contacted their pharmacy? Yes  This is the patient's preferred pharmacy:  [id number] [place], [place] - [address] [place] [address] Phone: [phone] Fax: [phone]  Is this the correct pharmacy for this prescription? Yes  Has the prescription been filled recently? Yes  Is the patient out of the medication? Yes  Has the patient been seen for an appointment in the last year OR does the patient have an upcoming appointment? Yes  Can we respond through MyChart? No  Agent: Please be advised that Rx refills may take up to 3 business days. We ask that you follow-up with your pharmacy.

## 2024-09-03 ENCOUNTER — Other Ambulatory Visit (HOSPITAL_COMMUNITY): Payer: Self-pay

## 2024-09-03 NOTE — Telephone Encounter (Signed)
 Received Provider portion PAP application Janumet  ([employer])

## 2024-09-04 ENCOUNTER — Encounter: Payer: Self-pay | Admitting: Pharmacist

## 2024-09-04 NOTE — Progress Notes (Unsigned)
   09/04/2024  Patient ID: Jasmine Rodriguez, female   DOB: 1956-02-25, 68 y.o.   MRN: [id number]  Application completed and Provider signature obtained for [employer] application for Janumet . Application faxed back to Luke Mall, CPhT for processing and scanning.  Lab Results  Component Value Date   HGBA1C 5.7 (H) 06/10/2024   HGBA1C 6.0 (H) 02/05/2024   HGBA1C 5.9 (H) 10/08/2023     Cassius DOROTHA Brought, PharmD, [facility] Clinical Pharmacist [facility] [phone]

## 2024-09-11 MED ORDER — FLUOCINONIDE 0.05 % EX SOLN: CUTANEOUS | 3 refills | Status: AC

## 2024-09-30 NOTE — Telephone Encounter (Signed)
 Reached out to patient regarding PAP application for Janumet  ([employer]) to see if received- left HIPAA compliant  v/m to return call for assistance.

## 2024-09-30 NOTE — Telephone Encounter (Signed)
 Error- documentation already opened.

## 2024-10-08 ENCOUNTER — Encounter: Payer: Self-pay | Admitting: Dermatology

## 2024-10-08 ENCOUNTER — Ambulatory Visit: Payer: Self-pay | Admitting: Dermatology

## 2024-10-08 ENCOUNTER — Encounter: Payer: Self-pay | Admitting: Internal Medicine

## 2024-10-08 DIAGNOSIS — Z79899 Other long term (current) drug therapy: Secondary | ICD-10-CM

## 2024-10-08 DIAGNOSIS — L219 Seborrheic dermatitis, unspecified: Secondary | ICD-10-CM | POA: Diagnosis not present

## 2024-10-08 DIAGNOSIS — L649 Androgenic alopecia, unspecified: Secondary | ICD-10-CM | POA: Diagnosis not present

## 2024-10-08 MED ORDER — SAFETY SEAL MISCELLANEOUS MISC: 1.0000 | Freq: Every morning | 6 refills | Status: AC

## 2024-10-08 NOTE — Progress Notes (Signed)
" ° °  New Patient Visit   Subjective  Jasmine Rodriguez is a 69 y.o. female who presents for the following: Hair Loss  ocated at the scalp that she would like to have examined. Patient reports the areas have been there for 2 years and often experiences excess shedding She reports the areas are not bothersome. She states that the areas have spread.   Patient reports she has previously been treated for these areas.  Patient was prescribed Flucinonide (Lidex ) 0.05% and has been using this daily for three week. Patient reports she has noticed more shedding   Patient states she uses Head and Shoulders shampoo Patient states she is not using any scalp oils other than her prescription Patient reports that she used relaxers often in the past Patient reports not wearing tighter hairstyles in the past   The following portions of the chart were reviewed this encounter and updated as appropriate: medications, allergies, medical history  Review of Systems:  No other skin or systemic complaints except as noted in HPI or Assessment and Plan.  Objective  Well appearing patient in no apparent distress; mood and affect are within normal limits.  A focused examination was performed of the following areas: Scalp  Relevant exam findings are noted in the Assessment and Plan.                  Assessment & Plan  ANDROGENETIC ALOPECIA (FEMALE PATTERN HAIR LOSS) Exam: Diffuse thinning of the crown and widening of the midline part with retention of the frontal hairline  Female Androgenic Alopecia is a chronic condition related to genetics and/or hormonal changes. In women androgenetic alopecia is commonly associated with menopause but may occur any time after puberty. It causes hair thinning primarily on the crown with widening of the part and temporal hairline recession.   ndrogenetic alopecia Generalized thinning, particularly at the temples and sides, likely due to genetic and hormonal changes  post-menopause. Condition has been present for 5-10 years with recent worsening. Treatment aims to halt progression and promote regrowth, with results expected in 3-4 months. Improvement varies, with some experiencing 60-90% regrowth. Continued treatment is necessary to maintain results. - Prescribed compounded topical minoxidil 8% with finasteride, to be applied once daily in the morning to affected areas. - Advised focusing application on temples and sides where thinning is most pronounced. - Discussed potential benefits of adding Viviscal or collagen supplements to boost results, though not necessary for treatment efficacy. - Scheduled follow-up in 5-6 months to assess progress with photographs of the temples and sides.  Long term medication management.  Patient is using long term (months to years) prescription medication  to control their dermatologic condition.  These medications require periodic monitoring to evaluate for efficacy and side effects and may require periodic laboratory monitoring.   Seborrheic dermatitis Mild itching and flaking consistent with seborrheic dermatitis. Current treatment with Lidex  (fluocinonide ) helps with symptoms but does not affect hair regrowth. - Continue using Lidex  for isolated areas of itchiness or flakiness as needed.     ANDROGENIC ALOPECIA    Return in 5 months (on 03/08/2025) for Alopecia f/u.  Jasmine Rodriguez, am acting as a neurosurgeon for [employer], DO .   Documentation: I have reviewed the above documentation for accuracy and completeness, and I agree with the above.  Jasmine Lenis, DO       "

## 2024-10-08 NOTE — Patient Instructions (Addendum)
 " VISIT SUMMARY:  Today, you were seen for hair thinning and loss, particularly noticeable at the temples and sides of your scalp. You have been experiencing this issue for the past five to ten years, with a recent increase in hair loss. You also reported mild itching and flaking of the scalp.  YOUR PLAN:  -ANDROGENETIC ALOPECIA:  This is a common form of hair loss due to genetic and hormonal changes, often occurring after menopause.   To address this, you have been prescribed a compounded topical solution of minoxidil 8% with finasteride to be applied once daily in the morning to the affected areas, especially the temples and sides.   You may also consider adding Viviscal or collagen supplements to boost results, although they are not necessary for the treatment to be effective. We will assess your progress in 5-6 months with photographs of the temples and sides.  -SEBORRHEIC DERMATITIS:  This condition causes mild itching and flaking of the scalp. You should continue using Lidex  (fluocinonide ) for isolated areas of itchiness or flakiness as needed.  INSTRUCTIONS:  Please follow up in 5-6 months to assess your progress with photographs of the temples and sides of your scalp.          Important Information  Due to recent changes in healthcare laws, you may see results of your pathology and/or laboratory studies on MyChart before the doctors have had a chance to review them. We understand that in some cases there may be results that are confusing or concerning to you. Please understand that not all results are received at the same time and often the doctors may need to interpret multiple results in order to provide you with the best plan of care or course of treatment. Therefore, we ask that you please give us  2 business days to thoroughly review all your results before contacting the office for clarification. Should we see a critical lab result, you will be contacted sooner.   If You  Need Anything After Your Visit  If you have any questions or concerns for your doctor, please call our main line at [phone] If no one answers, please leave a voicemail as directed and we will return your call as soon as possible. Messages left after 4 pm will be answered the following business day.   You may also send us  a message via MyChart. We typically respond to MyChart messages within 1-2 business days.  For prescription refills, please ask your pharmacy to contact our office. Our fax number is [phone].  If you have an urgent issue when the clinic is closed that cannot wait until the next business day, you can page your doctor at the number below.    Please note that while we do our best to be available for urgent issues outside of office hours, we are not available 24/7.   If you have an urgent issue and are unable to reach us , you may choose to seek medical care at your doctor's office, retail clinic, urgent care center, or emergency room.  If you have a medical emergency, please immediately call 911 or go to the emergency department. In the event of inclement weather, please call our main line at [phone] for an update on the status of any delays or closures.  Dermatology Medication Tips: Please keep the boxes that topical medications come in in order to help keep track of the instructions about where and how to use these. Pharmacies typically print the medication instructions only on the  boxes and not directly on the medication tubes.   If your medication is too expensive, please contact our office at [phone] or send us  a message through MyChart.   We are unable to tell what your co-pay for medications will be in advance as this is different depending on your insurance coverage. However, we may be able to find a substitute medication at lower cost or fill out paperwork to get insurance to cover a needed medication.   If a prior authorization is required to get  your medication covered by your insurance company, please allow us  1-2 business days to complete this process.  Drug prices often vary depending on where the prescription is filled and some pharmacies may offer cheaper prices.  The website www.goodrx.com contains coupons for medications through different pharmacies. The prices here do not account for what the cost may be with help from insurance (it may be cheaper with your insurance), but the website can give you the price if you did not use any insurance.  - You can print the associated coupon and take it with your prescription to the pharmacy.  - You may also stop by our office during regular business hours and pick up a GoodRx coupon card.  - If you need your prescription sent electronically to a different pharmacy, notify our office through [facility] or by phone at [phone]     "

## 2024-10-12 ENCOUNTER — Ambulatory Visit (INDEPENDENT_AMBULATORY_CARE_PROVIDER_SITE_OTHER): Admitting: Podiatry

## 2024-10-12 DIAGNOSIS — L84 Corns and callosities: Secondary | ICD-10-CM

## 2024-10-12 DIAGNOSIS — B351 Tinea unguium: Secondary | ICD-10-CM

## 2024-10-12 DIAGNOSIS — M79674 Pain in right toe(s): Secondary | ICD-10-CM

## 2024-10-12 DIAGNOSIS — M79675 Pain in left toe(s): Secondary | ICD-10-CM | POA: Diagnosis not present

## 2024-10-12 DIAGNOSIS — E1151 Type 2 diabetes mellitus with diabetic peripheral angiopathy without gangrene: Secondary | ICD-10-CM

## 2024-10-12 NOTE — Progress Notes (Unsigned)
 Left 4th toe callus distal lateral C/o neuropathy pain Right first toenail doing lack.  Advised adding urea nail gel

## 2024-10-12 NOTE — Patient Instructions (Signed)
 Look for urea 40% nail gel, cream or ointment and apply to the thickened toenails/ dry skin / calluses. This can be bought over the counter, at a pharmacy or online such as [employer].

## 2024-10-13 ENCOUNTER — Ambulatory Visit: Admitting: Podiatry

## 2024-10-15 ENCOUNTER — Encounter: Payer: Self-pay | Admitting: Podiatry

## 2024-10-28 ENCOUNTER — Encounter: Payer: Self-pay | Admitting: Internal Medicine

## 2024-10-28 ENCOUNTER — Ambulatory Visit: Payer: Self-pay | Admitting: Internal Medicine

## 2024-10-28 VITALS — BP 110/60 | HR 87 | Temp 98.1°F | Ht 66.0 in | Wt 160.8 lb

## 2024-10-28 DIAGNOSIS — E1169 Type 2 diabetes mellitus with other specified complication: Secondary | ICD-10-CM | POA: Diagnosis not present

## 2024-10-28 DIAGNOSIS — E785 Hyperlipidemia, unspecified: Secondary | ICD-10-CM

## 2024-10-28 DIAGNOSIS — L659 Nonscarring hair loss, unspecified: Secondary | ICD-10-CM | POA: Diagnosis not present

## 2024-10-28 DIAGNOSIS — I10 Essential (primary) hypertension: Secondary | ICD-10-CM | POA: Diagnosis not present

## 2024-10-28 LAB — CMP14+EGFR
ALT: 13 [IU]/L (ref 0–32)
AST: 15 [IU]/L (ref 0–40)
Albumin: 4.6 g/dL (ref 3.9–4.9)
Alkaline Phosphatase: 78 [IU]/L (ref 49–135)
BUN/Creatinine Ratio: 17 (ref 12–28)
BUN: 16 mg/dL (ref 8–27)
Bilirubin Total: 0.3 mg/dL (ref 0.0–1.2)
CO2: 25 mmol/L (ref 20–29)
Calcium: 10.1 mg/dL (ref 8.7–10.3)
Chloride: 102 mmol/L (ref 96–106)
Creatinine, Ser: 0.92 mg/dL (ref 0.57–1.00)
Globulin, Total: 2.3 g/dL (ref 1.5–4.5)
Glucose: 76 mg/dL (ref 70–99)
Potassium: 4.8 mmol/L (ref 3.5–5.2)
Sodium: 141 mmol/L (ref 134–144)
Total Protein: 6.9 g/dL (ref 6.0–8.5)
eGFR: 69 mL/min/{1.73_m2}

## 2024-10-28 LAB — CBC
Hematocrit: 43.1 % (ref 34.0–46.6)
Hemoglobin: 14.4 g/dL (ref 11.1–15.9)
MCH: 30.6 pg (ref 26.6–33.0)
MCHC: 33.4 g/dL (ref 31.5–35.7)
MCV: 92 fL (ref 79–97)
Platelets: 223 10*3/uL (ref 150–450)
RBC: 4.7 x10E6/uL (ref 3.77–5.28)
RDW: 12.9 % (ref 11.7–15.4)
WBC: 8 10*3/uL (ref 3.4–10.8)

## 2024-10-28 LAB — HEMOGLOBIN A1C
Est. average glucose Bld gHb Est-mCnc: 114 mg/dL
Hgb A1c MFr Bld: 5.6 % (ref 4.8–5.6)

## 2024-10-28 NOTE — Assessment & Plan Note (Signed)
 Hair shedding more than desired. Previous tests for ANA, thyroid , and iron were normal. - Checked iron levels today. - Consider collagen supplementation, starting three days a week, adjust based on tolerance. - Encouraged consumption of vitamin C-rich foods for collagen productio

## 2024-10-28 NOTE — Patient Instructions (Addendum)
 Vital Proteins collagen  Hypertension, Adult Hypertension is another name for high blood pressure. High blood pressure forces your heart to work harder to pump blood. This can cause problems over time. There are two numbers in a blood pressure reading. There is a top number (systolic) over a bottom number (diastolic). It is best to have a blood pressure that is below 120/80. What are the causes? The cause of this condition is not known. Some other conditions can lead to high blood pressure. What increases the risk? Some lifestyle factors can make you more likely to develop high blood pressure: Smoking. Not getting enough exercise or physical activity. Being overweight. Having too much fat, sugar, calories, or salt (sodium) in your diet. Drinking too much alcohol. Other risk factors include: Having any of these conditions: Heart disease. Diabetes. High cholesterol. Kidney disease. Obstructive sleep apnea. Having a family history of high blood pressure and high cholesterol. Age. The risk increases with age. Stress. What are the signs or symptoms? High blood pressure may not cause symptoms. Very high blood pressure (hypertensive crisis) may cause: Headache. Fast or uneven heartbeats (palpitations). Shortness of breath. Nosebleed. Vomiting or feeling like you may vomit (nauseous). Changes in how you see. Very bad chest pain. Feeling dizzy. Seizures. How is this treated? This condition is treated by making healthy lifestyle changes, such as: Eating healthy foods. Exercising more. Drinking less alcohol. Your doctor may prescribe medicine if lifestyle changes do not help enough and if: Your top number is above 130. Your bottom number is above 80. Your personal target blood pressure may vary. Follow these instructions at home: Eating and drinking  If told, follow the DASH eating plan. To follow this plan: Fill one half of your plate at each meal with fruits and  vegetables. Fill one fourth of your plate at each meal with whole grains. Whole grains include whole-wheat pasta, brown rice, and whole-grain bread. Eat or drink low-fat dairy products, such as skim milk or low-fat yogurt. Fill one fourth of your plate at each meal with low-fat (lean) proteins. Low-fat proteins include fish, chicken without skin, eggs, beans, and tofu. Avoid fatty meat, cured and processed meat, or chicken with skin. Avoid pre-made or processed food. Limit the amount of salt in your diet to less than 1,500 mg each day. Do not drink alcohol if: Your doctor tells you not to drink. You are pregnant, may be pregnant, or are planning to become pregnant. If you drink alcohol: Limit how much you have to: 0-1 drink a day for women. 0-2 drinks a day for men. Know how much alcohol is in your drink. In the [place] one drink equals one 12 oz bottle of beer (355 mL), one 5 oz glass of wine (148 mL), or one 1 oz glass of hard liquor (44 mL). Lifestyle  Work with your doctor to stay at a healthy weight or to lose weight. Ask your doctor what the best weight is for you. Get at least 30 minutes of exercise that causes your heart to beat faster (aerobic exercise) most days of the week. This may include walking, swimming, or biking. Get at least 30 minutes of exercise that strengthens your muscles (resistance exercise) at least 3 days a week. This may include lifting weights or doing Pilates. Do not smoke or use any products that contain nicotine or tobacco. If you need help quitting, ask your doctor. Check your blood pressure at home as told by your doctor. Keep all follow-up visits. Medicines Take  over-the-counter and prescription medicines only as told by your doctor. Follow directions carefully. Do not skip doses of blood pressure medicine. The medicine does not work as well if you skip doses. Skipping doses also puts you at risk for problems. Ask your doctor about side effects or  reactions to medicines that you should watch for. Contact a doctor if: You think you are having a reaction to the medicine you are taking. You have headaches that keep coming back. You feel dizzy. You have swelling in your ankles. You have trouble with your vision. Get help right away if: You get a very bad headache. You start to feel mixed up (confused). You feel weak or numb. You feel faint. You have very bad pain in your: Chest. Belly (abdomen). You vomit more than once. You have trouble breathing. These symptoms may be an emergency. Get help right away. Call 911. Do not wait to see if the symptoms will go away. Do not drive yourself to the hospital. Summary Hypertension is another name for high blood pressure. High blood pressure forces your heart to work harder to pump blood. For most people, a normal blood pressure is less than 120/80. Making healthy choices can help lower blood pressure. If your blood pressure does not get lower with healthy choices, you may need to take medicine. This information is not intended to replace advice given to you by your health care provider. Make sure you discuss any questions you have with your health care provider. Document Revised: 07/06/2021 Document Reviewed: 07/06/2021 Elsevier Patient Education  2024 [employer].

## 2024-10-28 NOTE — Assessment & Plan Note (Signed)
 Chronic, well controlled.  Essential hypertension managed with amlodipine  2.5 mg daily. - Continue amlodipine  2.5 mg daily - She is intolerant of Ace-inhibitors - Follow low sodium diet.

## 2024-10-28 NOTE — Progress Notes (Signed)
 I,Jasmine Rodriguez, CMA,acting as a neurosurgeon for Jasmine LOISE Slocumb, MD.,have documented all relevant documentation on the behalf of Jasmine LOISE Slocumb, MD,as directed by  Jasmine LOISE Slocumb, MD while in the presence of Jasmine LOISE Slocumb, MD.  Subjective:  Patient ID: Jasmine Rodriguez , female    DOB: 1956-06-15 , 69 y.o.   MRN: [id number]  Chief Complaint  Patient presents with   Hypertension    Patient presents today for a bp and dm follow up, Patient reports compliance with medication. Patient denies any chest pain, SOB, or headaches. Patient has no concerns today.    Diabetes    HPI Discussed the use of AI scribe software for clinical note transcription with the patient, who gave verbal consent to proceed.  History of Present Illness Jasmine Rodriguez is a 69 year old female with diabetes and hypertension who presents for a diabetes and blood pressure check.  She has been monitoring her blood sugar levels, which have been consistent, with readings of 100 mg/dL this morning and 894 mg/dL yesterday. Occasionally, her readings are higher, such as 113 mg/dL and 879 mg/dL. She experienced a low reading of 59 mg/dL at 10 AM on one occasion. She checks her blood sugar before meals and is not using a continuous glucose monitor.  She is currently taking Janumet  for diabetes management and is able to obtain it through patient assistance, although she is unsure if this is still the case. Her last A1c was 5.7%. For hypertension, she is taking amlodipine  2.5 mg daily.  She is also using Penlac , an antifungal nail polish, on her right and left great toes.  She reports hair shedding since her mother's passing a couple of years ago, which has not stopped but has lessened. She uses a product to help with hair shedding but cannot recall the name. The shedding is more than she likes to see on her hair rollers. Her thyroid  function was normal four months ago, and an ANA test last year was negative. Her iron levels were  normal last May.  She takes pravastatin  and Zyrtec  as needed. Her appetite is generally good, and she shares apples with her dog. She walks her dog regularly, weather permitting.   Diabetes She presents for her follow-up diabetic visit. She has type 2 diabetes mellitus. There are no diabetic associated symptoms. There are no hypoglycemic complications. Risk factors for coronary artery disease include diabetes mellitus, dyslipidemia, sedentary lifestyle, post-menopausal and obesity. Current diabetic treatment includes oral agent (dual therapy). She is compliant with treatment most of the time. She is following a diabetic diet. She never participates in exercise.     Past Medical History:  Diagnosis Date   DDD (degenerative disc disease)    DM (diabetes mellitus) (HCC)    GERD (gastroesophageal reflux disease)    High cholesterol    HTN (hypertension)      Family History  Problem Relation Age of Onset   Hypertension Mother    Arthritis Mother    Congestive Heart Failure Mother    Hypertension Father    Head & neck cancer Father      Current Outpatient Medications:    Accu-Chek Softclix Lancets lancets, USE AS INSTRUCTED, Disp: 100 each, Rfl: 12   acetaminophen (TYLENOL) 500 MG tablet, Take 500 mg by mouth every 6 (six) hours as needed., Disp: , Rfl:    albuterol  (PROAIR  HFA) 108 (90 Base) MCG/ACT inhaler, Inhale 2 puffs into the lungs every 6 (six) hours as needed  for wheezing or shortness of breath., Disp: 18 g, Rfl: 3   amLODipine  (NORVASC ) 2.5 MG tablet, TAKE 1 TABLET BY MOUTH EVERY DAY, Disp: 90 tablet, Rfl: 1   Calcium Carbonate (CALTRATE 600 PO), Take by mouth. daily, Disp: , Rfl:    cetirizine  (ZYRTEC  ALLERGY) 10 MG tablet, Take 1 tablet (10 mg total) by mouth daily., Disp: 90 tablet, Rfl: 2   ciclopirox  (PENLAC ) 8 % solution, Apply topically at bedtime. Apply thin layer over nail. Apply daily over previous coat. Remove weekly with polish remover., Disp: 6.6 mL, Rfl: 11    cyclobenzaprine  (FLEXERIL ) 10 MG tablet, Take 1 tablet (10 mg total) by mouth at bedtime as needed for muscle spasms., Disp: 30 tablet, Rfl: 0   fluocinonide  (LIDEX ) 0.05 % external solution, APPLY 1 ML TO THE AFFECTED AREA NIGHTLY AS NEEDED, Disp: 60 mL, Rfl: 3   glucosamine-chondroitin 500-400 MG tablet, Take 1 tablet by mouth 2 (two) times daily., Disp: , Rfl:    glucose blood (CONTOUR NEXT TEST) test strip, USE AS INSTRUCTED TO CHECK BLOOD SUGARS DAILY E11.69, Disp: 100 strip, Rfl: 12   glucose blood (CONTOUR NEXT TEST) test strip, Use as instructed, Disp: 100 each, Rfl: 12   JANUMET  50-500 MG tablet, TAKE 1 TABLET BY MOUTH TWICE A DAY, Disp: 60 tablet, Rfl: 8   lactase (LACTAID) 3000 UNITS tablet, Take 1 tablet by mouth 3 (three) times daily as needed. Dairy consumption, Disp: , Rfl:    meclizine  (ANTIVERT ) 12.5 MG tablet, Take 1 tablet (12.5 mg total) by mouth 2 (two) times daily as needed for dizziness., Disp: 20 tablet, Rfl: 0   Microlet Lancets MISC, Use as directed to check blood sugars E11.69, Disp: 100 each, Rfl: 2   Multiple Vitamins-Minerals (MULTIPLE VITAMINS/WOMENS PO), Take 1 tablet by mouth daily., Disp: , Rfl:    nystatin  (MYCOSTATIN /NYSTOP ) powder, USE AS DIRECTED, Disp: 60 g, Rfl: 1   omeprazole  (PRILOSEC) 20 MG capsule, TAKE 1 CAPSULE BY MOUTH EVERY DAY, Disp: 90 capsule, Rfl: 2   pravastatin  (PRAVACHOL ) 80 MG tablet, Take 1 tablet (80 mg total) by mouth every evening., Disp: 90 tablet, Rfl: 2   Safety Seal Miscellaneous MISC, Apply 1 Application topically in the morning. Medication Name: Hormonic Hair Solution (Minoxidil 8% and Finasteride 0.05%), Disp: 30 mL, Rfl: 6   valACYclovir  (VALTREX ) 500 MG tablet, Take 1 tablet (500 mg total) by mouth daily., Disp: 30 tablet, Rfl: 5   Allergies  Allergen Reactions   Skelaxin [Metaxalone] Anaphylaxis, Itching and Rash   Lisinopril      Review of Systems  Constitutional: Negative.   Respiratory: Negative.    Cardiovascular:  Negative.   Gastrointestinal: Negative.   Neurological: Negative.   Psychiatric/Behavioral: Negative.       Today's Vitals   10/28/24 1054  BP: 110/60  Pulse: 87  Temp: 98.1 F (36.7 C)  TempSrc: Oral  Weight: 160 lb 12.8 oz (72.9 kg)  Height: 5' 6 (1.676 m)  PainSc: 0-No pain   Body mass index is 25.95 kg/m.  Wt Readings from Last 3 Encounters:  10/28/24 160 lb 12.8 oz (72.9 kg)  07/07/24 163 lb 9.6 oz (74.2 kg)  07/01/24 164 lb (74.4 kg)    The 10-year ASCVD risk score (Arnett DK, et al., 2019) is: 12.6%   Values used to calculate the score:     Age: 77 years     Clinically relevant sex: Female     Is Non-Hispanic African American: Yes  Diabetic: Yes     Tobacco smoker: No     Systolic Blood Pressure: 110 mmHg     Is BP treated: Yes     HDL Cholesterol: 75 mg/dL     Total Cholesterol: 164 mg/dL  Objective:  Physical Exam Vitals and nursing note reviewed.  Constitutional:      Appearance: Normal appearance.  HENT:     Head: Normocephalic and atraumatic.  Eyes:     Extraocular Movements: Extraocular movements intact.  Cardiovascular:     Rate and Rhythm: Normal rate and regular rhythm.     Heart sounds: Normal heart sounds.  Pulmonary:     Effort: Pulmonary effort is normal.     Breath sounds: Normal breath sounds.  Musculoskeletal:     Cervical back: Normal range of motion.  Skin:    General: Skin is warm.  Neurological:     General: No focal deficit present.     Mental Status: She is alert.  Psychiatric:        Mood and Affect: Mood normal.        Behavior: Behavior normal.         Assessment And Plan:   Assessment & Plan Essential hypertension, benign Chronic, well controlled.  Essential hypertension managed with amlodipine  2.5 mg daily. - Continue amlodipine  2.5 mg daily - She is intolerant of Ace-inhibitors - Follow low sodium diet.  Type 2 diabetes mellitus with hyperlipidemia (HCC) Blood glucose and A1c levels well-controlled. -  Continue Janumet  as prescribed. - Continue pravastatin  as prescribed. Hair thinning Hair shedding more than desired. Previous tests for ANA, thyroid , and iron were normal. - Checked iron levels today. - Consider collagen supplementation, starting three days a week, adjust based on tolerance. - Encouraged consumption of vitamin C-rich foods for collagen productio   Orders Placed This Encounter  Procedures   CBC   CMP14+EGFR   Hemoglobin A1c   Iron, TIBC and Ferritin Panel   Ambulatory referral to Dermatology   Return for controlled DM check 4 months.  Patient was given opportunity to ask questions. Patient verbalized understanding of the plan and was able to repeat key elements of the plan. All questions were answered to their satisfaction.    I, Jasmine LOISE Slocumb, MD, have reviewed all documentation for this visit. The documentation on 10/28/24 for the exam, diagnosis, procedures, and orders are all accurate and complete.   IF YOU HAVE BEEN REFERRED TO A SPECIALIST, IT MAY TAKE 1-2 WEEKS TO SCHEDULE/PROCESS THE REFERRAL. IF YOU HAVE NOT HEARD FROM US /SPECIALIST IN TWO WEEKS, PLEASE GIVE US  A CALL AT [phone].

## 2024-10-28 NOTE — Assessment & Plan Note (Signed)
 Blood glucose and A1c levels well-controlled. - Continue Janumet  as prescribed. - Continue pravastatin  as prescribed.

## 2024-10-29 LAB — IRON,TIBC AND FERRITIN PANEL
Ferritin: 55 ng/mL (ref 15–150)
Iron Saturation: 18 % (ref 15–55)
Iron: 64 ug/dL (ref 27–139)
Total Iron Binding Capacity: 359 ug/dL (ref 250–450)
UIBC: 295 ug/dL (ref 118–369)

## 2024-10-31 ENCOUNTER — Other Ambulatory Visit: Payer: Self-pay | Admitting: Internal Medicine

## 2024-10-31 DIAGNOSIS — I10 Essential (primary) hypertension: Secondary | ICD-10-CM

## 2024-11-01 ENCOUNTER — Ambulatory Visit: Payer: Self-pay | Admitting: Internal Medicine

## 2025-01-11 ENCOUNTER — Ambulatory Visit: Admitting: Podiatry

## 2025-03-10 ENCOUNTER — Ambulatory Visit: Payer: Self-pay | Admitting: Internal Medicine

## 2025-06-16 ENCOUNTER — Encounter: Payer: Self-pay | Admitting: Internal Medicine

## 2025-08-11 ENCOUNTER — Ambulatory Visit: Payer: Self-pay
